# Patient Record
Sex: Male | Born: 1985
Health system: Southern US, Community
[De-identification: ages and names within clinical notes are randomized; demographics above are authoritative.]

## PROBLEM LIST (undated history)

## (undated) DIAGNOSIS — F419 Anxiety disorder, unspecified: Secondary | ICD-10-CM

## (undated) DIAGNOSIS — K7460 Unspecified cirrhosis of liver: Secondary | ICD-10-CM

## (undated) DIAGNOSIS — Z720 Tobacco use: Secondary | ICD-10-CM

## (undated) DIAGNOSIS — K219 Gastro-esophageal reflux disease without esophagitis: Secondary | ICD-10-CM

## (undated) DIAGNOSIS — G4700 Insomnia, unspecified: Secondary | ICD-10-CM

## (undated) DIAGNOSIS — I10 Essential (primary) hypertension: Secondary | ICD-10-CM

## (undated) DIAGNOSIS — F411 Generalized anxiety disorder: Secondary | ICD-10-CM

## (undated) DIAGNOSIS — N539 Unspecified male sexual dysfunction: Secondary | ICD-10-CM

## (undated) HISTORY — DX: Unspecified cirrhosis of liver: K74.60

## (undated) HISTORY — DX: Generalized anxiety disorder: F41.1

## (undated) HISTORY — DX: Unspecified male sexual dysfunction: N53.9

## (undated) HISTORY — DX: Insomnia, unspecified: G47.00

## (undated) HISTORY — DX: Gastro-esophageal reflux disease without esophagitis: K21.9

## (undated) HISTORY — DX: Tobacco use: Z72.0

## (undated) HISTORY — DX: Anxiety disorder, unspecified: F41.9

## (undated) HISTORY — PX: UPPER GASTROINTESTINAL ENDOSCOPY: SHX188

---
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
## 2005-08-21 ENCOUNTER — Emergency Department (HOSPITAL_COMMUNITY): Admission: EM | Admit: 2005-08-21 | Discharge: 2005-08-21 | Payer: Self-pay | Admitting: Emergency Medicine

## 2005-08-29 ENCOUNTER — Emergency Department (HOSPITAL_COMMUNITY): Admission: EM | Admit: 2005-08-29 | Discharge: 2005-08-29 | Payer: Self-pay | Admitting: Emergency Medicine

## 2007-04-03 ENCOUNTER — Emergency Department (HOSPITAL_COMMUNITY): Admission: EM | Admit: 2007-04-03 | Discharge: 2007-04-03 | Payer: Self-pay | Admitting: Emergency Medicine

## 2007-04-07 ENCOUNTER — Emergency Department (HOSPITAL_COMMUNITY): Admission: EM | Admit: 2007-04-07 | Discharge: 2007-04-07 | Payer: Self-pay | Admitting: Emergency Medicine

## 2011-01-15 LAB — CBC
HCT: 43
MCV: 84.7
Platelets: 213
RBC: 5.08

## 2011-01-15 LAB — DIFFERENTIAL
Basophils Absolute: 0
Basophils Relative: 0
Lymphocytes Relative: 4 — ABNORMAL LOW
Neutro Abs: 14.6 — ABNORMAL HIGH
Neutrophils Relative %: 90 — ABNORMAL HIGH

## 2011-01-15 LAB — COMPREHENSIVE METABOLIC PANEL
Albumin: 3.5
Alkaline Phosphatase: 100
BUN: 10
CO2: 26
Chloride: 105
Creatinine, Ser: 0.78
GFR calc non Af Amer: >60
Glucose, Bld: 83
Total Bilirubin: 1.5 — ABNORMAL HIGH

## 2011-01-15 LAB — MONONUCLEOSIS SCREEN: Mono Screen: NEGATIVE

## 2011-12-05 ENCOUNTER — Ambulatory Visit (INDEPENDENT_AMBULATORY_CARE_PROVIDER_SITE_OTHER): Payer: Managed Care, Other (non HMO) | Admitting: Physician Assistant

## 2011-12-05 VITALS — BP 137/83 | HR 81 | Temp 98.2°F | Resp 16 | Ht 72.0 in | Wt 208.0 lb

## 2011-12-05 DIAGNOSIS — J029 Acute pharyngitis, unspecified: Secondary | ICD-10-CM

## 2011-12-05 LAB — POCT RAPID STREP A (OFFICE): Rapid Strep A Screen: NEGATIVE

## 2011-12-05 MED ORDER — MAGIC MOUTHWASH W/LIDOCAINE: 10.0000 mL | ORAL | Status: DC | PRN

## 2011-12-05 NOTE — Patient Instructions (Signed)
Drink at least 64 ounces of water daily and get plenty of rest. Use ibuprofen &/or acetaminophen as needed for pain.

## 2011-12-05 NOTE — Progress Notes (Signed)
  Subjective:    Patient ID: Albert Hall, male    DOB: 1985/07/28, 26 y.o.   MRN: [id number]  HPI This 26 y.o. male presents for evaluation of a white patch on the side of the left tonsil.  Has happened x 3 previously.  Usually there are lots of white patches, throat gets very sore, feels almost closed up.  Today, throat pain is mild, worse with talking and eating, and only one "spot."  No fever, chills, n/v.  Some loose stools after eating at [employer] last night (typical for him). No nasal congestion, sinus pressure, drainage, ear pain/fullness, or cough.  Wife has similar symptoms.  Review of Systems As above.   History reviewed. No pertinent past medical history.  History reviewed. No pertinent past surgical history.  Prior to Admission medications   Not on File    Allergies  Allergen Reactions  . Penicillins Anaphylaxis    History   Social History  . Marital Status: Married    Spouse Name: Herbert Seta    Number of Children: 0  . Years of Education: 14   Occupational History  . [employer]    Social History Main Topics  . Smoking status: Current Everyday Smoker -- 0.5 packs/day for 3 years    Types: Cigarettes  . Smokeless tobacco: Former Neurosurgeon  . Alcohol Use: 0.0 - 0.5 oz/week    0-1 drink(s) per week  . Drug Use: No  . Sexually Active: Yes -- Male partner(s)   Other Topics Concern  . Not on file   Social History Narrative  . No narrative on file    Family History  Problem Relation Age of Onset  . Asthma Sister   . Asthma Sister        Objective:   Physical Exam  Constitutional: He is oriented to person, place, and time. Vital signs are normal. He appears well-developed and well-nourished. No distress.  HENT:  Head: Normocephalic and atraumatic.  Right Ear: Hearing normal.  Left Ear: Hearing normal.  Eyes: EOM are normal. Pupils are equal, round, and reactive to light.  Neck: Normal range of motion. Neck supple. No thyromegaly present.    Cardiovascular: Normal rate, regular rhythm and normal heart sounds.   Pulmonary/Chest: Effort normal and breath sounds normal.  Lymphadenopathy:       Head (right side): No tonsillar, no preauricular, no posterior auricular and no occipital adenopathy present.       Head (left side): No tonsillar, no preauricular, no posterior auricular and no occipital adenopathy present.    He has no cervical adenopathy.       Right: No supraclavicular adenopathy present.       Left: No supraclavicular adenopathy present.  Neurological: He is alert and oriented to person, place, and time. No sensory deficit.  Skin: Skin is warm, dry and intact. No rash noted. No cyanosis or erythema. Nails show no clubbing.  Psychiatric: He has a normal mood and affect.   Results for orders placed in visit on 12/05/11  POCT RAPID STREP A (OFFICE)      Component Value Range   Rapid Strep A Screen Negative  Negative      Assessment & Plan:   1. Acute pharyngitis  POCT rapid strep A, Culture, Group A Strep, Alum & Mag Hydroxide-Simeth (MAGIC MOUTHWASH W/LIDOCAINE) SOLN

## 2011-12-08 LAB — CULTURE, GROUP A STREP: Organism ID, Bacteria: NORMAL

## 2012-02-08 ENCOUNTER — Encounter (HOSPITAL_BASED_OUTPATIENT_CLINIC_OR_DEPARTMENT_OTHER): Payer: Self-pay | Admitting: *Deleted

## 2012-02-08 ENCOUNTER — Emergency Department (HOSPITAL_BASED_OUTPATIENT_CLINIC_OR_DEPARTMENT_OTHER)
Admission: EM | Admit: 2012-02-08 | Discharge: 2012-02-08 | Disposition: A | Discharge status: Home / Self Care | Payer: Worker's Compensation | Attending: Emergency Medicine | Admitting: Emergency Medicine

## 2012-02-08 DIAGNOSIS — F17200 Nicotine dependence, unspecified, uncomplicated: Secondary | ICD-10-CM | POA: Insufficient documentation

## 2012-02-08 DIAGNOSIS — Z79899 Other long term (current) drug therapy: Secondary | ICD-10-CM | POA: Insufficient documentation

## 2012-02-08 DIAGNOSIS — W2209XA Striking against other stationary object, initial encounter: Secondary | ICD-10-CM | POA: Insufficient documentation

## 2012-02-08 DIAGNOSIS — S61210A Laceration without foreign body of right index finger without damage to nail, initial encounter: Secondary | ICD-10-CM

## 2012-02-08 DIAGNOSIS — S61209A Unspecified open wound of unspecified finger without damage to nail, initial encounter: Secondary | ICD-10-CM | POA: Insufficient documentation

## 2012-02-08 DIAGNOSIS — Y9229 Other specified public building as the place of occurrence of the external cause: Secondary | ICD-10-CM | POA: Insufficient documentation

## 2012-02-08 DIAGNOSIS — Y990 Civilian activity done for income or pay: Secondary | ICD-10-CM | POA: Insufficient documentation

## 2012-02-08 MED ORDER — HYDROCODONE-ACETAMINOPHEN 5-325 MG PO TABS: 2.0000 | ORAL_TABLET | ORAL | Status: DC | PRN

## 2012-02-08 MED ORDER — SULFAMETHOXAZOLE-TRIMETHOPRIM 800-160 MG PO TABS: 1.0000 | ORAL_TABLET | Freq: Two times a day (BID) | ORAL | Status: DC

## 2012-02-08 MED ORDER — LIDOCAINE HCL 2 % IJ SOLN
INTRAMUSCULAR | Status: AC
  Administered 2012-02-08: 21:00:00
  Filled 2012-02-08: qty 20

## 2012-02-08 NOTE — ED Provider Notes (Signed)
History     CSN: [id number]  Arrival date & time 02/08/12  1815   First MD Initiated Contact with Patient 02/08/12 1906      Chief Complaint  Patient presents with  . Finger Injury    (Consider location/radiation/quality/duration/timing/severity/associated sxs/prior treatment) Patient is a 26 y.o. male presenting with hand pain. The history is provided by the patient. No language interpreter was used.  Hand Pain This is a new problem. The current episode started today. The problem occurs constantly. The symptoms are aggravated by bending. Treatments tried: bandage.  Pt reports he slipped and fell and cut finger over a piece of steele.  Pt complains of laceration only.  No other complaint  History reviewed. No pertinent past medical history.  History reviewed. No pertinent past surgical history.  Family History  Problem Relation Age of Onset  . Asthma Sister   . Asthma Sister     History  Substance Use Topics  . Smoking status: Current Every Day Smoker -- 0.5 packs/day for 3 years    Types: Cigarettes  . Smokeless tobacco: Former Neurosurgeon  . Alcohol Use: 0.0 - 0.5 oz/week    0-1 drink(s) per week      Review of Systems  Skin: Positive for wound.  All other systems reviewed and are negative.    Allergies  Penicillins  Home Medications   Current Outpatient Rx  Name Route Sig Dispense Refill  . MAGIC MOUTHWASH W/LIDOCAINE Oral Take 10 mLs by mouth every 2 (two) hours as needed. 360 mL 0    BP 145/89  Pulse 89  Temp 98.3 F (36.8 C) (Oral)  Resp 18  Ht 6' (1.829 m)  Wt 197 lb (89.359 kg)  BMI 26.72 kg/m2  SpO2 100%  Physical Exam  Nursing note and vitals reviewed. Constitutional: He appears well-developed and well-nourished.  HENT:  Head: Normocephalic.  Musculoskeletal: Normal range of motion.       2cm flap laceration right index finger.   Neurological: He is alert.  Skin: Skin is warm.    ED Course  LACERATION REPAIR Date/Time: 02/08/2012  8:50 PM Performed by: Elson Areas Authorized by: Elson Areas Consent: Verbal consent obtained. Risks and benefits: risks, benefits and alternatives were discussed Consent given by: patient Required items: required blood products, implants, devices, and special equipment available Patient identity confirmed: verbally with patient Time out: Immediately prior to procedure a "time out" was called to verify the correct patient, procedure, equipment, support staff and site/side marked as required. Body area: upper extremity Location details: right index finger Laceration length: 2 cm Tendon involvement: none Vascular damage: no Anesthesia: local infiltration Local anesthetic: lidocaine 2% without epinephrine Patient sedated: no Preparation: Patient was prepped and draped in the usual sterile fashion. Amount of cleaning: standard Debridement: moderate Skin closure: 5-0 Prolene Number of sutures: 6 Technique: simple Approximation: loose Approximation difficulty: simple Patient tolerance: Patient tolerated the procedure well with no immediate complications.   (including critical care time)  Labs Reviewed - No data to display No results found.   No diagnosis found.    MDM          Elson Areas, PA 02/08/12 2052

## 2012-02-08 NOTE — ED Notes (Signed)
MD at bedside. 

## 2012-02-08 NOTE — ED Notes (Signed)
Patient states he [place] the [place] and slipped, hitting hie right hand on a piece of rolled steel.  Laceration to right index finger.

## 2012-02-10 NOTE — ED Provider Notes (Signed)
History/physical exam/procedure(s) were performed by non-physician practitioner and as supervising physician I was immediately available for consultation/collaboration. I have reviewed all notes and am in agreement with care and plan.   Joshuan Bolander S Olivier Frayre, MD 02/10/12 2358 

## 2013-07-23 ENCOUNTER — Emergency Department (HOSPITAL_BASED_OUTPATIENT_CLINIC_OR_DEPARTMENT_OTHER)
Admission: EM | Admit: 2013-07-23 | Discharge: 2013-07-23 | Disposition: A | Discharge status: Home / Self Care | Payer: 59 | Attending: Emergency Medicine | Admitting: Emergency Medicine

## 2013-07-23 ENCOUNTER — Encounter (HOSPITAL_BASED_OUTPATIENT_CLINIC_OR_DEPARTMENT_OTHER): Payer: Self-pay | Admitting: Emergency Medicine

## 2013-07-23 DIAGNOSIS — Y929 Unspecified place or not applicable: Secondary | ICD-10-CM | POA: Insufficient documentation

## 2013-07-23 DIAGNOSIS — T23229A Burn of second degree of unspecified single finger (nail) except thumb, initial encounter: Secondary | ICD-10-CM | POA: Insufficient documentation

## 2013-07-23 DIAGNOSIS — X088XXA Exposure to other specified smoke, fire and flames, initial encounter: Secondary | ICD-10-CM | POA: Insufficient documentation

## 2013-07-23 DIAGNOSIS — F17200 Nicotine dependence, unspecified, uncomplicated: Secondary | ICD-10-CM | POA: Insufficient documentation

## 2013-07-23 DIAGNOSIS — Y9389 Activity, other specified: Secondary | ICD-10-CM | POA: Insufficient documentation

## 2013-07-23 DIAGNOSIS — T300 Burn of unspecified body region, unspecified degree: Secondary | ICD-10-CM

## 2013-07-23 DIAGNOSIS — Z880 Allergy status to penicillin: Secondary | ICD-10-CM | POA: Insufficient documentation

## 2013-07-23 MED ORDER — SILVER SULFADIAZINE 1 % EX CREA
TOPICAL_CREAM | Freq: Once | CUTANEOUS | Status: AC
  Administered 2013-07-23: 23:00:00 via TOPICAL
  Filled 2013-07-23: qty 85

## 2013-07-23 NOTE — ED Provider Notes (Signed)
Medical screening examination/treatment/procedure(s) were performed by non-physician practitioner and as supervising physician I was immediately available for consultation/collaboration.   EKG Interpretation None        Rolan BuccoMelanie Treven Holtman, MD 07/23/13 2348

## 2013-07-23 NOTE — Discharge Instructions (Signed)
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
## 2013-07-23 NOTE — ED Notes (Signed)
PA at bedside.

## 2014-01-08 ENCOUNTER — Encounter: Payer: Self-pay | Admitting: Family Medicine

## 2014-01-08 ENCOUNTER — Ambulatory Visit (INDEPENDENT_AMBULATORY_CARE_PROVIDER_SITE_OTHER): Payer: 59 | Admitting: Family Medicine

## 2014-01-08 VITALS — BP 120/84 | Temp 98.3°F | Ht 72.5 in | Wt 230.0 lb

## 2014-01-08 DIAGNOSIS — F411 Generalized anxiety disorder: Secondary | ICD-10-CM

## 2014-01-08 DIAGNOSIS — Z720 Tobacco use: Secondary | ICD-10-CM

## 2014-01-08 DIAGNOSIS — F17200 Nicotine dependence, unspecified, uncomplicated: Secondary | ICD-10-CM

## 2014-01-08 DIAGNOSIS — G4700 Insomnia, unspecified: Secondary | ICD-10-CM | POA: Insufficient documentation

## 2014-01-08 HISTORY — DX: Generalized anxiety disorder: F41.1

## 2014-01-08 HISTORY — DX: Insomnia, unspecified: G47.00

## 2014-01-08 MED ORDER — LORAZEPAM 0.5 MG PO TABS: ORAL_TABLET | ORAL | Status: DC

## 2014-01-08 MED ORDER — VARENICLINE TARTRATE 1 MG PO TABS: ORAL_TABLET | ORAL | Status: DC

## 2014-01-08 NOTE — Patient Instructions (Signed)
Chantix 1 mg............ one half tab daily in the morning  Taper by 2 per week  Followup in one month  Ativan 0.5.........Marland Kitchen. 1 at bedtime for sleep  Call and make an appointment to see Judithe ModestSusan Bond for evaluation of your anxiety disorder
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
## 2014-01-08 NOTE — Progress Notes (Signed)
Pre visit review using our clinic review tool, if applicable. No additional management support is needed unless otherwise documented below in the visit note. 

## 2014-01-09 ENCOUNTER — Telehealth: Payer: Self-pay | Admitting: Family Medicine

## 2014-01-09 NOTE — Telephone Encounter (Signed)
emmi mailed  °

## 2014-01-09 NOTE — Telephone Encounter (Signed)
PA for Chantix was denied.  Pt must have a history of failure, contraindication or intolerance to bupropion.

## 2014-01-10 NOTE — Telephone Encounter (Signed)
Noted.  Patient will have to pay out of pocket if he wants to continue.

## 2014-01-25 ENCOUNTER — Ambulatory Visit (INDEPENDENT_AMBULATORY_CARE_PROVIDER_SITE_OTHER): Payer: 59 | Admitting: Licensed Clinical Social Worker

## 2014-01-25 DIAGNOSIS — F4322 Adjustment disorder with anxiety: Secondary | ICD-10-CM

## 2014-02-01 ENCOUNTER — Ambulatory Visit (INDEPENDENT_AMBULATORY_CARE_PROVIDER_SITE_OTHER): Payer: 59 | Admitting: Licensed Clinical Social Worker

## 2014-02-01 DIAGNOSIS — F4322 Adjustment disorder with anxiety: Secondary | ICD-10-CM

## 2014-02-11 ENCOUNTER — Encounter: Payer: Self-pay | Admitting: Family Medicine

## 2014-02-11 ENCOUNTER — Ambulatory Visit (INDEPENDENT_AMBULATORY_CARE_PROVIDER_SITE_OTHER): Payer: 59 | Admitting: Family Medicine

## 2014-02-11 VITALS — BP 120/84 | Temp 98.2°F | Wt 230.0 lb

## 2014-02-11 DIAGNOSIS — Z720 Tobacco use: Secondary | ICD-10-CM

## 2014-02-11 HISTORY — DX: Tobacco use: Z72.0

## 2014-02-11 MED ORDER — LORAZEPAM 1 MG PO TABS: 1.0000 mg | ORAL_TABLET | Freq: Two times a day (BID) | ORAL | Status: DC | PRN

## 2014-02-11 MED ORDER — TRAZODONE HCL 50 MG PO TABS: ORAL_TABLET | ORAL | Status: DC

## 2014-02-11 MED ORDER — OMEPRAZOLE 20 MG PO CPDR: 20.0000 mg | DELAYED_RELEASE_CAPSULE | Freq: Every day | ORAL | Status: DC

## 2014-02-11 NOTE — Progress Notes (Signed)
Pre visit review using our clinic review tool, if applicable. No additional management support is needed unless otherwise documented below in the visit note. 

## 2014-02-11 NOTE — Patient Instructions (Signed)
Prilosec 20 mg daily in the morning.  Chantix 0.5 mg daily in the morning............. Begin a tapering program........Marland Kitchen. Decrease by 3 per week  Follow-up in 4 weeks  Ativan 1 mg.......Marland Kitchen. 1 at bedtime  Trazodone 50 mg.........Marland Kitchen. 1 at bedtime

## 2014-02-11 NOTE — Progress Notes (Signed)
   Subjective:    Patient ID: Jeannetta EllisMichael W Cafaro, male    DOB: 07/31/1985, 28 y.o.   MRN: [id number]  HPIMichael is a 28 year old married male smoker three quarters of pack a day who comes in today for evaluation of 3 issues  He works second shift and has developed sleep dysfunction.  He says for example her get off the 10:00 at night come home [place] we then go to bed to 3:00 in the morning. He tried the 0.5 of Ativan but it didn't help  He takes Prilosec 20 mg for chronic reflux that is helping he wishes to continue that  We gave him prescription for the Chantix he didn't get it filled because it was too expensive. He continues to smoke three quarters a pack of cigarettes per day.    Review of Systems    review of systems otherwise negative Objective:   Physical Exam  Well-developed well-nourished male no acute distress vital signs stable he is afebrile      Assessment & Plan:  Sleep dysfunction........... Continue low-dose Ativan.......Marland Kitchen. Add trazodone........ Follow-up in 4 weeks  Tobacco abuse............ Chantix........ 0.5 mg daily....... Outlined tapering program  Reflux esophagitis............ Continue Prilosec 20 mg daily

## 2014-03-06 ENCOUNTER — Telehealth: Payer: Self-pay | Admitting: Family Medicine

## 2014-03-06 DIAGNOSIS — Z720 Tobacco use: Secondary | ICD-10-CM

## 2014-03-06 MED ORDER — OMEPRAZOLE 20 MG PO CPDR: 20.0000 mg | DELAYED_RELEASE_CAPSULE | Freq: Two times a day (BID) | ORAL | Status: DC

## 2014-03-06 NOTE — Telephone Encounter (Signed)
Wife called and said the following rx was written wrong omeprazole (PRILOSEC) 20 MG capsule  Wife said the rx should be for 60 pills instead of 30 because he takes 2 a day .   The previous rx was for 30 pills   [facility]

## 2014-03-06 NOTE — Telephone Encounter (Signed)
New Rx sent.

## 2014-03-11 ENCOUNTER — Encounter: Payer: Self-pay | Admitting: Family Medicine

## 2014-03-11 ENCOUNTER — Ambulatory Visit (INDEPENDENT_AMBULATORY_CARE_PROVIDER_SITE_OTHER): Payer: 59 | Admitting: Family Medicine

## 2014-03-11 VITALS — BP 130/90 | Temp 97.9°F | Wt 232.0 lb

## 2014-03-11 DIAGNOSIS — Z720 Tobacco use: Secondary | ICD-10-CM

## 2014-03-11 DIAGNOSIS — G4700 Insomnia, unspecified: Secondary | ICD-10-CM

## 2014-03-11 NOTE — Progress Notes (Signed)
Pre visit review using our clinic review tool, if applicable. No additional management support is needed unless otherwise documented below in the visit note. 

## 2014-03-11 NOTE — Patient Instructions (Signed)
Continue to taper the cigarettes......... by 1 per week........ at the Chantix 0.5 mg daily in the morning when necessary  Continue the trazodone 50 mg at bedtime  Stop the Ativan  Return when necessary

## 2014-03-11 NOTE — Progress Notes (Signed)
   Subjective:    Patient ID: Albert Hall, male    DOB: 05/28/1985, 28 y.o.   MRN: [id number]  HPI Albert Hall is a 28 year old married male smoker....... down to 5 cigarettes a day...Marland Kitchen.Marland Kitchen.. did not take the Chantix yet...Marland Kitchen.Marland Kitchen.Marland Kitchen. who comes in today for follow-up of tobacco abuse and insomnia  He decided try to taper not take the Chantix. Encouraged him to continue whatever process he wants to use to stop smoking completely  He's taken the trazodone 50 mg at bedtime and asleep is pretty much back to normal. He also takes a full milligram or half a milligram of the Ativan.   Review of Systems    review of systems otherwise negative Objective:   Physical Exam  Well-developed well-nourished male no acute distress vital signs stable he is afebrile      Assessment & Plan:  Tobacco abuse........ tapered to 5 cigarettes per day........ continue to taper..... Chantix when necessary  Insomnia.......... continue trazodone 50 mg at bedtime... Stop the Ativan

## 2014-05-13 ENCOUNTER — Encounter: Payer: Self-pay | Admitting: Family Medicine

## 2014-05-13 ENCOUNTER — Ambulatory Visit (INDEPENDENT_AMBULATORY_CARE_PROVIDER_SITE_OTHER): Payer: 59 | Admitting: Family Medicine

## 2014-05-13 VITALS — BP 142/92 | HR 93 | Temp 98.1°F | Ht 72.5 in | Wt 222.6 lb

## 2014-05-13 DIAGNOSIS — F411 Generalized anxiety disorder: Secondary | ICD-10-CM

## 2014-05-13 DIAGNOSIS — G4700 Insomnia, unspecified: Secondary | ICD-10-CM

## 2014-05-13 DIAGNOSIS — Z720 Tobacco use: Secondary | ICD-10-CM

## 2014-05-13 MED ORDER — LORAZEPAM 1 MG PO TABS: 1.0000 mg | ORAL_TABLET | Freq: Two times a day (BID) | ORAL | Status: DC | PRN

## 2014-05-13 NOTE — Progress Notes (Signed)
   Subjective:    Patient ID: Albert Hall, male    DOB: 05/04/1985, 29 y.o.   MRN: [id number]  HPI  Albert Hall is a 29 year old male who comes in today accompanied by his wife for evaluation of multiple issues  We tried trazodone 50 mg at bedtime for sleep dysfunction however he had side effects and stopped it 2 weeks ago. He can sleep on the Ativan 1 mg daily but I'm not sure this is a good long-term solution.  He has a lot of GI upset because he continues to smoke. He takes Prilosec 20 mg twice a day  I gave her a prescription for the Chantix he never got it filled. He continues to smoke 5-10 cigarettes a day.  We discussed all the options she is willing to go see Dr. Nolen MuMcKinney in group for further evaluation     Review of Systems Review of systems otherwise negative    Objective:   Physical Exam  Well-developed well-nourished male no acute distress vital signs stable he's afebrile      Assessment & Plan:  Tobacco abuse,,,,,,, start the Chantix program  Insomnia,,,,,, continue Ativan 1 mg daily at bedtime,,,,,,,, consult with Dr. Loralie ChampagneMcKinney's group  Reflux esophagitis secondary to nicotine use,,,,,,,, continue Prilosec until you stop smoking

## 2014-05-13 NOTE — Patient Instructions (Signed)
Continue the Ativan 1 mg at bedtime  Call Dr. Ann MakiParrish McKinney's office phone number [phone] and asked to see one of the new doctors  Chantix............ one half tab daily in the morning......... and begin a tapering program as follows......... a today for a week.......... 6 today for a week.......Marland Kitchen. etc.  Continue Chantix one half tab daily for 2 months after you stop smoking  1 she stop smoking you will no longer need the Prilosec

## 2014-05-13 NOTE — Progress Notes (Signed)
Pre visit review using our clinic review tool, if applicable. No additional management support is needed unless otherwise documented below in the visit note. 

## 2014-08-06 ENCOUNTER — Other Ambulatory Visit: Payer: Self-pay | Admitting: Family Medicine

## 2015-02-18 ENCOUNTER — Other Ambulatory Visit: Payer: Self-pay | Admitting: Family Medicine

## 2015-04-10 ENCOUNTER — Other Ambulatory Visit: Payer: Self-pay | Admitting: Family Medicine

## 2015-04-15 ENCOUNTER — Other Ambulatory Visit: Payer: Self-pay | Admitting: Family Medicine

## 2015-06-26 ENCOUNTER — Other Ambulatory Visit: Payer: Self-pay | Admitting: Family Medicine

## 2015-09-02 ENCOUNTER — Other Ambulatory Visit: Payer: Self-pay | Admitting: Family Medicine

## 2015-09-16 ENCOUNTER — Ambulatory Visit (INDEPENDENT_AMBULATORY_CARE_PROVIDER_SITE_OTHER): Payer: 59 | Admitting: Family Medicine

## 2015-09-16 ENCOUNTER — Ambulatory Visit: Payer: Self-pay | Admitting: Family Medicine

## 2015-09-16 ENCOUNTER — Encounter: Payer: Self-pay | Admitting: Family Medicine

## 2015-09-16 VITALS — BP 122/90 | HR 116 | Temp 97.7°F | Ht 71.0 in | Wt 241.2 lb

## 2015-09-16 DIAGNOSIS — K219 Gastro-esophageal reflux disease without esophagitis: Secondary | ICD-10-CM

## 2015-09-16 DIAGNOSIS — G4700 Insomnia, unspecified: Secondary | ICD-10-CM

## 2015-09-16 DIAGNOSIS — R112 Nausea with vomiting, unspecified: Secondary | ICD-10-CM

## 2015-09-16 DIAGNOSIS — Z720 Tobacco use: Secondary | ICD-10-CM | POA: Diagnosis not present

## 2015-09-16 DIAGNOSIS — F411 Generalized anxiety disorder: Secondary | ICD-10-CM

## 2015-09-16 DIAGNOSIS — Z6379 Other stressful life events affecting family and household: Secondary | ICD-10-CM | POA: Diagnosis not present

## 2015-09-16 DIAGNOSIS — R197 Diarrhea, unspecified: Secondary | ICD-10-CM

## 2015-09-16 MED ORDER — PANTOPRAZOLE SODIUM 40 MG PO TBEC: 40.0000 mg | DELAYED_RELEASE_TABLET | Freq: Every day | ORAL | Status: DC

## 2015-09-16 MED ORDER — CITALOPRAM HYDROBROMIDE 10 MG PO TABS: 10.0000 mg | ORAL_TABLET | Freq: Every day | ORAL | Status: DC

## 2015-09-16 MED ORDER — LORAZEPAM 1 MG PO TABS: 1.0000 mg | ORAL_TABLET | Freq: Two times a day (BID) | ORAL | Status: DC | PRN

## 2015-09-16 NOTE — Assessment & Plan Note (Signed)
Continue to encourage cessation. Not a good time to quit due to increased stressors.

## 2015-09-16 NOTE — Progress Notes (Signed)
BP 122/90 mmHg  Pulse 116  Temp(Src) 97.7 F (36.5 C)  Ht  (1.803 m)  Wt 241 lb 4 oz (109.43 kg)  BMI 33.66 kg/m2  SpO2 96%   CC: new pt to establish  Subjective:    Patient ID: Albert Hall, male    DOB: 06-12-1985, 30 y.o.   MRN: [id number]  HPI: Albert Hall is a 30 y.o. male presenting on 09/16/2015 for Establish Care; Emesis; and Insomnia   Presents with wife and 3wk old daughter - she has moderate VSD. Seeing Dr Meredeth Ide at [facility].   Transfer from Dr Tawanna Cooler today. Last seen 05/2014 with chronic insomnia and anxiety - referred to psych Dr Ann Maki McKinney's group to establish care. Intolerant to trazodone, was prior on ativan  for sleep and anxiety - has been receiving #60 tablets per month regularly by prior PCP. Ran out 09/05/2014. Was taking 1.5 nightly for sleep and extra tablet for social stressors. Endorses h/o panic attacks and social anxiety. He did have 3 sessions with Dr Nolen Mu - "didn't go well," felt frustrated. No SI/HI or significant depression with new baby, just tired.   Chronic insomnia - valerian didn't help. Benadryl causes oversedation. Melatonin has not been tried.   GERD - controlled on omeprazole  BID.   Continued smoker 1/4 ppd.   1d h/o GI illness - nausea/vomiting, diarrhea x2 today loose dark stool, persistent throughout the night. No sick contacts at work. No new foods, no revent travel. No fevers/chills, constipation, abd pain or cramping. No blood in stool.   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 Activity: no regular exercise Diet: good water, fruits/vegetables daily  Relevant past medical, surgical, family and social history reviewed and updated as indicated. Interim medical history since our last visit reviewed. Allergies and medications reviewed and updated. Current  Outpatient Prescriptions on File Prior to Visit  Medication Sig  . varenicline (CHANTIX CONTINUING MONTH PAK) 1 MG tablet One half tab every morning (Patient not taking: Reported on 09/16/2015)   No current facility-administered medications on file prior to visit.    Review of Systems Per HPI unless specifically indicated in ROS section     Objective:    BP 122/90 mmHg  Pulse 116  Temp(Src) 97.7 F (36.5 C)  Ht  (1.803 m)  Wt 241 lb 4 oz (109.43 kg)  BMI 33.66 kg/m2  SpO2 96%  Wt Readings from Last 3 Encounters:  09/16/15 241 lb 4 oz (109.43 kg)  05/13/14 222 lb 9.6 oz (100.971 kg)  03/11/14 232 lb (105.235 kg)    Physical Exam  Constitutional: He appears well-developed and well-nourished. No distress.  HENT:  Mouth/Throat: Oropharynx is clear and moist. No oropharyngeal exudate.  Eyes: Conjunctivae and EOM are normal. Pupils are equal, round, and reactive to light. No scleral icterus.  Cardiovascular: Regular rhythm, normal heart sounds and intact distal pulses.  Tachycardia present.   No murmur heard. Pulmonary/Chest: Effort normal and breath sounds normal. No respiratory distress. He has no wheezes. He has no rales.  Musculoskeletal: He exhibits no edema.  Skin: Skin is warm and dry. No rash noted.  Psychiatric: He has a normal mood and affect. His behavior is normal. Judgment and thought content normal.  Nursing note and vitals reviewed.     Assessment & Plan:   Problem  List Items Addressed This Visit    Insomnia (Chronic)    Chronic, has tried trazodone, valerian root, benadryl. Long-term treatment has been nightly benzo with some tolerance noted (0.5mg  --> 1.5mg  latest). He also has been drinking 1 beer nightly.  Discussed concerns with this chronic regimen including tolerance/dependence and addiction potential of medication. Pt agrees to taper down on nightly ativan for sleep (1mg  x 1 wk then 0.5mg  x 1 wk) and trial melatonin 3mg  for sleep.  Provided with sleep  hygiene checklist - see pt instructions.       Generalized anxiety disorder (Chronic)    Long-term - saw psychiatrist last year x3.  Currently on benzo as only treatment regimen. He did run out of benzo 1+ wk ago - concern for benzo withdrawal with tachycardia, malaise, nausea/vomiting. Will refill lorazepam 1mg  #60 in hopes of tapering off over next 1-2 months. May use PRN benzo for anxiety after initial 2 wk taper Discussed better long term management of GAD - pt agrees to trial celexa 10mg  daily. Discussed common side effects.  RTC 1 mo f/u visit.      Tobacco abuse (Chronic)    Continue to encourage cessation. Not a good time to quit due to increased stressors.      GERD (gastroesophageal reflux disease) - Primary    Well controlled on omeprazole 20mg  BID. Will switch to protonix 40mg  daily given celexa interaction.       Relevant Medications   pantoprazole (PROTONIX) 40 MG tablet   Stressful life event affecting family    Increased work stress, increased family stress - 3wk daughter with VSD [facility] Dr Meredeth IdeFleming. Support provided.      Nausea vomiting and diarrhea    1-2d h/o GI symptoms - possible gastroenteritis vs benzo withdrawal. Restart benzo with goal to taper off, discussed isolation in case viral illness given 3wk old daughter at home. See above.           Follow up plan: Return in about 4 weeks (around 10/14/2015), or as needed, for follow up visit.  Albert BoydenJavier Suhana Wilner, MD

## 2015-09-16 NOTE — Patient Instructions (Addendum)
Lorazepam refilled today - #60 tablets. Try to taper off use for sleep - take 1 tablet nightly for 1 week then decrease to 1/2 tablet nightly for 1 week then stop for sleep. Keep lorazepam pills available to use as needed for anxiety.  Start celexa 10mg  daily for anxiety. This medicine will take 4-6 weeks to take full effect. Start melatonin 3mg  nightly for sleep. Work on healthy stress relieving strategies.  Return in 1 month for follow up.   Sleep hygiene checklist: 1. Avoid naps during the day 2. Avoid stimulants such as caffeine and nicotine. Avoid bedtime alcohol (it can speed onset of sleep but the body's metabolism can cause awakenings). 3. All forms of exercise help ensure sound sleep - limit vigorous exercise to morning or late afternoon 4. Avoid food too close to bedtime including chocolate (which contains caffeine) 5. Soak up natural light 6. Establish regular bedtime routine. 7. Associate bed with sleep - avoid TV, computer or phone, reading while in bed. 8. Ensure pleasant, relaxing sleep environment - quiet, dark, cool room.

## 2015-09-16 NOTE — Assessment & Plan Note (Signed)
Long-term - saw psychiatrist last year x3.  Currently on benzo as only treatment regimen. He did run out of benzo 1+ wk ago - concern for benzo withdrawal with tachycardia, malaise, nausea/vomiting. Will refill lorazepam 1mg  #60 in hopes of tapering off over next 1-2 months. May use PRN benzo for anxiety after initial 2 wk taper Discussed better long term management of GAD - pt agrees to trial celexa 10mg  daily. Discussed common side effects.  RTC 1 mo f/u visit.

## 2015-09-16 NOTE — Assessment & Plan Note (Addendum)
Chronic, has tried trazodone, valerian root, benadryl. Long-term treatment has been nightly benzo with some tolerance noted (0.5mg  --> 1.5mg  latest). He also has been drinking 1 beer nightly.  Discussed concerns with this chronic regimen including tolerance/dependence and addiction potential of medication. Pt agrees to taper down on nightly ativan for sleep (1mg  x 1 wk then 0.5mg  x 1 wk) and trial melatonin 3mg  for sleep.  Provided with sleep hygiene checklist - see pt instructions.

## 2015-09-16 NOTE — Assessment & Plan Note (Signed)
1-2d h/o GI symptoms - possible gastroenteritis vs benzo withdrawal. Restart benzo with goal to taper off, discussed isolation in case viral illness given 3wk old daughter at home. See above.

## 2015-09-16 NOTE — Progress Notes (Signed)
Pre visit review using our clinic review tool, if applicable. No additional management support is needed unless otherwise documented below in the visit note. 

## 2015-09-16 NOTE — Assessment & Plan Note (Signed)
Well controlled on omeprazole 20mg  BID. Will switch to protonix 40mg  daily given celexa interaction.

## 2015-09-16 NOTE — Assessment & Plan Note (Addendum)
Increased work stress, increased family stress - 3wk daughter with VSD followed by [facility] Dr Meredeth IdeFleming. Support provided.

## 2015-09-17 ENCOUNTER — Telehealth: Payer: Self-pay

## 2015-09-17 NOTE — Telephone Encounter (Signed)
Pt was seen on 09/16/15 and pt said melatonin was not called in to pharmacy; advised pt per AVS to take melatonin 3 mg at hs for sleep. This is an OTC med. Pt voiced understanding.

## 2015-10-17 ENCOUNTER — Encounter: Payer: Self-pay | Admitting: Family Medicine

## 2015-10-17 ENCOUNTER — Ambulatory Visit (INDEPENDENT_AMBULATORY_CARE_PROVIDER_SITE_OTHER): Payer: 59 | Admitting: Family Medicine

## 2015-10-17 VITALS — BP 134/86 | HR 76 | Temp 98.1°F | Wt 237.0 lb

## 2015-10-17 DIAGNOSIS — G4700 Insomnia, unspecified: Secondary | ICD-10-CM | POA: Diagnosis not present

## 2015-10-17 DIAGNOSIS — F411 Generalized anxiety disorder: Secondary | ICD-10-CM

## 2015-10-17 DIAGNOSIS — K219 Gastro-esophageal reflux disease without esophagitis: Secondary | ICD-10-CM

## 2015-10-17 MED ORDER — SERTRALINE HCL 50 MG PO TABS: 50.0000 mg | ORAL_TABLET | Freq: Every day | ORAL | Status: DC

## 2015-10-17 MED ORDER — OMEPRAZOLE 20 MG PO CPDR: 20.0000 mg | DELAYED_RELEASE_CAPSULE | Freq: Two times a day (BID) | ORAL | Status: DC

## 2015-10-17 NOTE — Assessment & Plan Note (Signed)
Doing well on SSRI - will change to sertraline 50mg  daily due to PPI interaction - see below. RTC 4-6 wks f/u visit

## 2015-10-17 NOTE — Assessment & Plan Note (Signed)
Breakthrough sxs on protonix 40mg  daily  Will return to omeprazole 20mg  bid. Will change SSRI from celexa to zoloft. Update with effect.

## 2015-10-17 NOTE — Progress Notes (Signed)
Pre visit review using our clinic review tool, if applicable. No additional management support is needed unless otherwise documented below in the visit note. 

## 2015-10-17 NOTE — Patient Instructions (Addendum)
You are doing well today.  Stop celexa, start sertraline 50mg  daily for anxiety.  Restart omeprazole 20mg  twice daily instead of protonix.  Return in 4-6 weeks for follow up visit.  [facility].

## 2015-10-17 NOTE — Assessment & Plan Note (Signed)
Melatonin has been helpful, but noticing more trouble with sleep likely due to worsening GERD sxs at night.

## 2015-10-17 NOTE — Progress Notes (Signed)
   BP 134/86 mmHg  Pulse 76  Temp(Src) 98.1 F (36.7 C) (Oral)  Wt 237 lb (107.502 kg)  SpO2 98%   CC: f/u visit  Subjective:    Patient ID: Albert Hall, male    DOB: 06/06/1985, 30 y.o.   MRN: [id number]  HPI: Albert Hall is a 30 y.o. male presenting on 10/17/2015 for Anxiety; Gastroesophageal Reflux; and Diarrhea   See prior note for details. Established care last month. Newborn with VSD is doing well - [facility].   H/o panic attacks, social anxiety, chronic insomnia. Valerian, benadryl, trazodone not effective. Melatonin may be helping sleep some - but persistent trouble sleeping possibly due to worsening GERD again. Last visit for anxiety we refilled lorazepam #60 and discussed taper over 1-2 months. We also started celexa 10mg  daily. Some trouble with 4th of July fireworks. He has decreased ativan to PRN anxiety. Still notices some social anxiety.   GERD - on omeprazole 20mg  BID. Last visit we changed to 40mg  protonix once daily due to celexa interaction - not as effective however. Ongoing stomach upset with diarrhea in am. No vomiting, fevers, blood in stool.   Relevant past medical, surgical, family and social history reviewed and updated as indicated. Interim medical history since our last visit reviewed. Allergies and medications reviewed and updated. Current Outpatient Prescriptions on File Prior to Visit  Medication Sig  . LORazepam (ATIVAN) 1 MG tablet Take 1 tablet (1 mg total) by mouth 2 (two) times daily as needed.  . varenicline (CHANTIX CONTINUING MONTH PAK) 1 MG tablet One half tab every morning (Patient not taking: Reported on 10/17/2015)   No current facility-administered medications on file prior to visit.    Review of Systems Per HPI unless specifically indicated in ROS section     Objective:    BP 134/86 mmHg  Pulse 76  Temp(Src) 98.1 F (36.7 C) (Oral)  Wt 237 lb (107.502 kg)  SpO2 98%  Wt Readings from Last 3 Encounters:    10/17/15 237 lb (107.502 kg)  09/16/15 241 lb 4 oz (109.43 kg)  05/13/14 222 lb 9.6 oz (100.971 kg)    Physical Exam  Constitutional: He appears well-developed and well-nourished. No distress.  Psychiatric: He has a normal mood and affect. His behavior is normal. Thought content normal.  Nursing note and vitals reviewed.     Assessment & Plan:  Pt planning on establishing care [facility] - much closer to home.  Problem List Items Addressed This Visit    Insomnia (Chronic)    Melatonin has been helpful, but noticing more trouble with sleep likely due to worsening GERD sxs at night.      Generalized anxiety disorder - Primary (Chronic)    Doing well on SSRI - will change to sertraline 50mg  daily due to PPI interaction - see below. RTC 4-6 wks f/u visit      GERD (gastroesophageal reflux disease)    Breakthrough sxs on protonix 40mg  daily  Will return to omeprazole 20mg  bid. Will change SSRI from celexa to zoloft. Update with effect.      Relevant Medications   omeprazole (PRILOSEC) 20 MG capsule       Follow up plan: Return in about 4 weeks (around 11/14/2015) for follow up visit.  Eustaquio BoydenJavier Haydan Wedig, MD

## 2015-10-27 ENCOUNTER — Telehealth: Payer: Self-pay

## 2015-10-27 DIAGNOSIS — F411 Generalized anxiety disorder: Secondary | ICD-10-CM

## 2015-10-27 MED ORDER — ESCITALOPRAM OXALATE 10 MG PO TABS: 10.0000 mg | ORAL_TABLET | Freq: Every day | ORAL | Status: DC

## 2015-10-27 NOTE — Telephone Encounter (Signed)
Albert Hall [facility](DPR signed) left v/m; pt started taking Zoloft 2 weeks ago and having side effects;pt has not been sleeping well, thinks zoloft increasing insomnia and pt having problems with climax and wants to know if can just stop zoloft or does pt have to taper off the med. Albert Hall request cb on 10/28/15.

## 2015-10-27 NOTE — Telephone Encounter (Signed)
Ok to stop zoloft.  Would start lexapro (escitalopram) 10mg  daily - new dose sent to pharmacy. This med is similar to celexa but may have less interaction with omeprazole.  Spoke with wife heather.

## 2015-11-11 ENCOUNTER — Encounter: Payer: Self-pay | Admitting: Family Medicine

## 2015-11-11 ENCOUNTER — Ambulatory Visit (INDEPENDENT_AMBULATORY_CARE_PROVIDER_SITE_OTHER): Payer: 59 | Admitting: Family Medicine

## 2015-11-11 VITALS — BP 144/94 | HR 101 | Ht 72.5 in | Wt 237.0 lb

## 2015-11-11 DIAGNOSIS — E669 Obesity, unspecified: Secondary | ICD-10-CM | POA: Diagnosis not present

## 2015-11-11 DIAGNOSIS — R5382 Chronic fatigue, unspecified: Secondary | ICD-10-CM | POA: Diagnosis not present

## 2015-11-11 DIAGNOSIS — G4700 Insomnia, unspecified: Secondary | ICD-10-CM | POA: Diagnosis not present

## 2015-11-11 DIAGNOSIS — Z6379 Other stressful life events affecting family and household: Secondary | ICD-10-CM

## 2015-11-11 DIAGNOSIS — R5383 Other fatigue: Secondary | ICD-10-CM | POA: Insufficient documentation

## 2015-11-11 DIAGNOSIS — F411 Generalized anxiety disorder: Secondary | ICD-10-CM

## 2015-11-11 DIAGNOSIS — Z720 Tobacco use: Secondary | ICD-10-CM

## 2015-11-11 MED ORDER — ESCITALOPRAM OXALATE 10 MG PO TABS: 5.0000 mg | ORAL_TABLET | Freq: Every day | ORAL | 6 refills | Status: DC

## 2015-11-11 NOTE — Patient Instructions (Addendum)
Guided meditation for detachment from overthinking by Kristopher Glee; hypnosis for clearing subconscious negativity by Kristopher Glee guided meditation for sleep floating amongst the stars by Romero Belling   Start lexapro- one half a tablet daily for 1 week and then may increase to 1 tablet  Exercise moderate intensity daily for goal of at least.      Adjustment Disorder Adjustment disorder is an unusually severe reaction to a stressful life event, such as the loss of a job or physical illness. The event may be any stressful event other than the loss of a loved one. Adjustment disorder may affect your feelings, your thinking, how you act, or a combination of these. It may interfere with personal relationships or with the way you are at work, school, or home. People with this disorder are at risk for suicide and substance abuse. They may develop a more serious mental disorder, such as major depressive disorder or post-traumatic stress disorder. SIGNS AND SYMPTOMS  Symptoms may include:  Sadness, depressed mood, or crying spells.  Loss of enjoyment.  Change in appetite or weight.  Sense of loss or hopelessness.  Thoughts of suicide.  Anxiety, worry, or nervousness.  Trouble sleeping.  Avoiding family and friends.  Poor school performance.  Fighting or vandalism.  Reckless driving.  Skipping school.  Poor work International aid/development worker.  Ignoring bills. Symptoms of adjustment disorder start within 3 months of the stressful life event. They do not last more than 6 months after the event has ended. DIAGNOSIS  To make a diagnosis, your health care provider will ask about what has happened in your life and how it has affected you. He or she may also ask about your medical history and use of medicines, alcohol, and other substances. Your health care provider may do a physical exam and order lab tests or other studies. You may be referred to a mental health specialist for  evaluation. TREATMENT  Treatment options include:  Counseling or talk therapy. Talk therapy is usually provided by mental health specialists.  Medicine. Certain medicines may help with depression, anxiety, and sleep.  Support groups. Support groups offer emotional support, advice, and guidance. They are made up of people who have had similar experiences. HOME CARE INSTRUCTIONS  Keep all follow-up visits as directed by your health care provider. This is important.  Take medicines only as directed by your health care provider. SEEK MEDICAL CARE IF:  Your symptoms get worse.  SEEK IMMEDIATE MEDICAL CARE IF: You have serious thoughts about hurting yourself or someone else. MAKE SURE YOU:  Understand these instructions.  Will watch your condition.  Will get help right away if you are not doing well or get worse.   This information is not intended to replace advice given to you by your health care provider. Make sure you discuss any questions you have with your health care provider.   Document Released: 12/01/2005 Document Revised: 04/19/2014 Document Reviewed: 08/21/2013 Elsevier Interactive Patient Education 2016 [employer]   Posttraumatic Stress Disorder Posttraumatic stress disorder (PTSD) is a mental disorder. It occurs after a traumatic event in your life. The traumatic events that cause PTSD are outside the range of normal human experience. Examples of these events include war, automobile accidents, natural disasters, rape, domestic violence, and violent crimes. Most people who experience these types of events are able to heal on their own. Those who do not heal develop PTSD. PTSD can happen to anyone at any age. However, people with  a history of childhood abuse are at increased risk for developing PTSD.  SYMPTOMS  The traumatic event that causes PTSD must be a threat to life, cause serious injury, or involve sexual violence. The traumatic event is usually experienced directly  by the person who develops PTSD. Sometimes PTSD occurs in people who witness traumas that occur to others or who hear about a trauma that occurs to a close family member or friend. The following behaviors are characteristic of people with PTSD:  People with PTSD re-experience the traumatic event in one or more of the following ways (intrusion symptoms):  Recurrent, unwanted distressing memories while awake.  Recurrent distressing dreams.  Sensations similar to those felt when the event originally occurred (flashbacks).   Intense or prolonged emotional distress, triggered by reminders of the trauma. This may include fear, horror, intense sadness, or anger.  Marked physical reactions, triggered by reminders of the trauma. This may include racing heart, shortness of breath, sweating, and shaking.  People with PTSD avoid thoughts, conversations, people, or activities that remind them of the traumatic event (avoidance symptoms).  People with PTSD have negative changes in their thinking and mood after the traumatic event. These changes include:  Inability to remember one or more significant aspects of the traumatic event (memory gaps).  Exaggerated negative perceptions about themselves or others, such as believing that they are bad people or that no one can be trusted.  Unrealistic assignment of blame to themselves or others for the traumatic event.  Persistent negative emotional state, such as fear, horror, anger, sadness, guilt, or shame.  Markedly decreased interest or participation in significant activities.  A loss of connection with other people.  Inability to experience positive emotions, such as happiness or love.  People with PTSD are more sensitive to their environment and react more easily than others (hyperarousal-overreactivity symptoms). These symptoms include:  Irritability, with angry outbursts toward other people or objects. The outbursts are easily triggered and may be  verbal or physical.  Careless or self-destructive behavior. This may include reckless driving or drug use.  A feeling of being on edge, with increased alertness (hypervigilance).  Exaggerated reactions to stimuli, such as being easily startled.   Difficulty concentrating.  Difficulty sleeping. PTSD symptoms may start soon after a frightening event or months or years later. They last at least 1 month or longer and can affect one or more areas of functioning, such as social or occupational functioning.  DIAGNOSIS  PTSD is diagnosed through an assessment by a mental health professional. Bonita Quin will be asked questions about the traumatic events in your life. You will also be asked about how these events have changed your thoughts, mood, behavior, and ability to function on a daily basis. You may be asked about your use of alcohol or drugs, which can make PTSD symptoms worse. TREATMENT  Unlike many mental disorders, which require lifelong management, PTSD is a curable condition. The goal of PTSD treatment is to neutralize the negative effects of the traumatic event on daily functioning, not erase the memory of the event. The following treatments may be prescribed to reach this goal:  Medicines. Certain medicines can reduce some PTSD symptoms. Intrusion symptoms and hyperarousal-overactivity symptoms respond best to medicines.  Counseling (talk therapy). Talk therapy with a mental health professional who is experienced in treating PTSD can help. Talk therapy can provide education, emotional support, and coping skills. Certain types of talk therapy that specifically target the traumatic events are the most effective treatment for  PTSD:  Prolonged exposure therapy, which involves remembering and processing the traumatic event with a therapist in a safe environment until it no longer creates a negative emotional response.  Eye movement desensitization and reprocessing therapy, which involves the use of  repetitive physical stimulation of the senses that alternates between the right and left sides of the body. It is believed that this therapy facilitates communication between the two sides of the brain. This communication helps the mind to integrate the fragmented memories of the traumatic event into a whole story that makes sense and no longer creates a negative emotional response. Most people with PTSD benefit from a combination of these treatments.    This information is not intended to replace advice given to you by your health care provider. Make sure you discuss any questions you have with your health care provider.   Document Released: 12/22/2000 Document Revised: 04/19/2014 Document Reviewed: 06/15/2012 Elsevier Interactive Patient Education [employer]

## 2015-11-11 NOTE — Progress Notes (Signed)
New patient office visit note:  Impression and Recommendations:    mild Fatigue Patient has concerns about his fatigue especially after a long workday. He does not feel like exercising or doing much of anything. He is worried that he might have a thyroid or some other type of problem.  Educated about PTSD and generalized anxiety disorder having a wide array of symptoms including fatigue and lack of motivation, especially with added Ativan use with alcohol.  We'll obtain fasting blood work in near future.  Insomnia Advised patient against Ativan/ or any chronic benzo use. He does not use them regularly only sporadically, thus withdrawal is not an issue    Long discussion with patient regarding addiction and physical tolerance issues, and detriments associated with long-term use of this medicine including personality changes, memory and cognitive dysfunction, sleep disorders etc.  Advised sleep meditation recordings from YouTube and various relaxation techniques. Melatonin daily at bedtime up to 3 mg nightly and proper sleep hygiene discussed  Stressful life event affecting family Advise healthy eating and regular exercise of 30 minutes 5-7 days per week of moderate intensity exercise.  Tobacco abuse Encourage smoking cessation. Patient declines at this time due to other stressors in his life.  Generalized anxiety disorder zoloft - caused insomnia, sexual dysfunction  celexa - worked well but interacted with omeprazole  After long discussion of various SSRIs risks and benefits, we will do a trial of Lexapro. He'll start with a half tablet daily and slowly increase dose over time.  Obese Prudent diet and exercise counseling done    Patient's Medications  New Prescriptions   No medications on file  Previous Medications   LORAZEPAM (ATIVAN) 1 MG TABLET    Take 1 tablet (1 mg total) by mouth 2 (two) times daily as needed.   OMEPRAZOLE (PRILOSEC) 20 MG CAPSULE    Take  1 capsule (20 mg total) by mouth 2 (two) times daily before a meal.  Modified Medications   Modified Medication Previous Medication   ESCITALOPRAM (LEXAPRO) 10 MG TABLET escitalopram (LEXAPRO) 10 MG tablet      Take 0.5 tablets (5 mg total) by mouth daily.    Take 1 tablet (10 mg total) by mouth daily.  Discontinued Medications   MELATONIN 3 MG TABS    Take 1 tablet by mouth at bedtime as needed.   VARENICLINE (CHANTIX CONTINUING MONTH PAK) 1 MG TABLET    One half tab every morning    Return in about 3 weeks (around 12/02/2015), or f/up for start lexapro, for fasting blood work then one week later f/up with me.  The patient was counseled, risk factors were discussed, anticipatory guidance given.  Gross side effects, risk and benefits, and alternatives of medications discussed with patient.  Patient is aware that all medications have potential side effects and we are unable to predict every side effect or drug-drug interaction that may occur.  Expresses verbal understanding and consents to current therapy plan and treatment regimen.  Please see AVS handed out to patient at the end of our visit for further patient instructions/ counseling done pertaining to today's office visit.    Note: This document was prepared using Dragon voice recognition software and may include unintentional dictation errors.  ----------------------------------------------------------------------------------------------------------------------    Subjective:    Chief Complaint  Patient presents with  . Establish Care    HPI: Albert Hall is a pleasant 30 y.o. male who presents to [facility]  at [facility] today to review their medical history with me and establish care.    [employer] for total 5 yrs and spend 1 yr in combat in [place]-  Married 9 yrs.  Presents to the clinic today with his wife.   Works at [employer].  Primarily concerned about his PTSD-  zoloft- killed sex life;    celexa  -worked well-  made protonix not work- heartburn got worse. Will   Heartburn- omeprazole  BID,  Failed protonix  Insomnia- melatonin now. Lorazepam in past.  Has irreg schedule for work esp on wkends. Trazadone- failed it as well.   only uses lorazepam occasionally, for social anxiety problems. Maybe once a week if that.  Has h/o daily use with increasing tolerance and need for higher doses.   Current Tob user- 3/4ppd for 13 yrs.  Obesity: Has struggled with weight ever since he came out of the [employer].  No longer with regular exercise     Patient Active Problem List   Diagnosis Date Noted  . mild Fatigue 11/11/2015  . Stressful life event affecting family 09/16/2015  . Nausea vomiting and diarrhea 09/16/2015  . GERD (gastroesophageal reflux disease)   . Tobacco abuse 02/11/2014  . Insomnia 01/08/2014  . Generalized anxiety disorder 01/08/2014     Past Medical History:  Diagnosis Date  . Anxiety   . Generalized anxiety disorder 01/08/2014  . GERD (gastroesophageal reflux disease)   . Insomnia 01/08/2014  . Male sexual dysfunction   . Tobacco abuse 02/11/2014     History reviewed. No pertinent surgical history.   Family History  Problem Relation Age of Onset  . Asthma Father   . Asthma Sister   . Diabetes Paternal Uncle   . Cancer Neg Hx   . Stroke Neg Hx   . CAD Neg Hx      History  Drug Use No    History  Alcohol Use  . 4.2 - 4.8 oz/week  . 7 Cans of beer per week    Comment: 1 beer nightly    History  Smoking Status  . Current Every Day Smoker  . Packs/day: 0.25  . Years: 3.00  . Types: Cigarettes  Smokeless Tobacco  . Former Engineer, mining Medications  New Prescriptions   No medications on file  Previous Medications   LORAZEPAM (ATIVAN) 1 MG TABLET    Take 1 tablet (1 mg total) by mouth 2 (two) times daily as needed.   OMEPRAZOLE (PRILOSEC) 20 MG CAPSULE    Take 1 capsule (20 mg total) by mouth 2 (two) times daily before a meal.    Modified Medications   Modified Medication Previous Medication   ESCITALOPRAM (LEXAPRO) 10 MG TABLET escitalopram (LEXAPRO) 10 MG tablet      Take 0.5 tablets (5 mg total) by mouth daily.    Take 1 tablet (10 mg total) by mouth daily.  Discontinued Medications   MELATONIN 3 MG TABS    Take 1 tablet by mouth at bedtime as needed.   VARENICLINE (CHANTIX CONTINUING MONTH PAK) 1 MG TABLET    One half tab every morning    Allergies: Penicillins and Trazodone and nefazodone  Review of Systems:   ( Completed via Adult Medical History Intake form today ) General:  Denies fever, chills, appetite changes, unexplained weight loss.  Optho/Auditory:   Denies visual changes, blurred vision/LOV, ringing in ears/ diff hearing Respiratory:   Denies SOB, DOE, cough, wheezing.  Cardiovascular:   Denies chest  pain, palpitations, new onset peripheral edema  Gastrointestinal:   Denies nausea, vomiting, diarrhea.  Genitourinary:    Denies dysuria, increased frequency, flank pain.  Endocrine:     Denies hot or cold intolerance, polyuria, polydipsia. Musculoskeletal:  Denies unexplained myalgias, joint swelling, arthralgias, gait problems.  Skin:  Denies rash, suspicious lesions or new/ changes in moles Neurological:    Denies dizziness, syncope, unexplained weakness, lightheadedness, numbness  Psychiatric/Behavioral:   Denies mood changes, suicidal or homicidal ideations, hallucinations    Objective:   Blood pressure 144/94, pulse 89, height 6' 0.5" (1.842 m), weight 237 lb (107.5 kg). Body mass index is 31.7 kg/m.   General: Well Developed, well nourished, and in no acute distress.  Neuro: Alert and oriented x3, extra-ocular muscles intact, sensation grossly intact.  HEENT: Normocephalic, atraumatic, pupils equal round reactive to light, neck supple, no gross masses, no carotid bruits, no JVD apprec Skin: no gross suspicious lesions or rashes  Cardiac: Regular rate and rhythm, no murmurs rubs or  gallops.  Respiratory: Essentially clear to auscultation bilaterally. Not using accessory muscles, speaking in full sentences.  Abdominal: Soft, not grossly distended Musculoskeletal: Ambulates w/o diff, FROM * 4 ext.  Vasc: less 2 sec cap RF, warm and pink  Psych:  No HI/SI, judgement and insight good.

## 2015-11-15 DIAGNOSIS — E669 Obesity, unspecified: Secondary | ICD-10-CM | POA: Insufficient documentation

## 2015-11-15 NOTE — Assessment & Plan Note (Addendum)
zoloft - caused insomnia, sexual dysfunction  celexa - worked well but interacted with omeprazole  After long discussion of various SSRIs risks and benefits, we will do a trial of Lexapro. He'll start with a half tablet daily and slowly increase dose over time.

## 2015-11-15 NOTE — Assessment & Plan Note (Addendum)
Patient has concerns about his fatigue especially after a long workday. He does not feel like exercising or doing much of anything. He is worried that he might have a thyroid or some other type of problem.  Educated about PTSD and generalized anxiety disorder having a wide array of symptoms including fatigue and lack of motivation, especially with added Ativan use with alcohol.  We'll obtain fasting blood work in near future.

## 2015-11-15 NOTE — Assessment & Plan Note (Signed)
Encourage smoking cessation. Patient declines at this time due to other stressors in his life.

## 2015-11-15 NOTE — Assessment & Plan Note (Signed)
Prudent diet and exercise counseling done

## 2015-11-15 NOTE — Assessment & Plan Note (Signed)
Advise healthy eating and regular exercise of 30 minutes 5-7 days per week of moderate intensity exercise.

## 2015-11-15 NOTE — Assessment & Plan Note (Addendum)
Advised patient against Ativan/ or any chronic benzo use.    Long discussion with patient regarding addiction and physical tolerance issues, and detriments associated with long-term use of this medicine including personality changes, memory and cognitive dysfunction, sleep disorders etc.  Advised sleep meditation recordings from YouTube and various relaxation techniques. Melatonin daily at bedtime up to 3 mg nightly and proper sleep hygiene discussed

## 2015-11-17 ENCOUNTER — Other Ambulatory Visit (INDEPENDENT_AMBULATORY_CARE_PROVIDER_SITE_OTHER): Payer: 59

## 2015-11-17 DIAGNOSIS — Z1321 Encounter for screening for nutritional disorder: Secondary | ICD-10-CM | POA: Diagnosis not present

## 2015-11-17 DIAGNOSIS — Z13220 Encounter for screening for lipoid disorders: Secondary | ICD-10-CM

## 2015-11-17 DIAGNOSIS — R5383 Other fatigue: Secondary | ICD-10-CM

## 2015-11-18 LAB — COMPREHENSIVE METABOLIC PANEL
ALT: 39 U/L (ref 9–46)
AST: 23 U/L (ref 10–40)
Albumin: 4.3 g/dL (ref 3.6–5.1)
Alkaline Phosphatase: 110 U/L (ref 40–115)
BILIRUBIN TOTAL: 0.5 mg/dL (ref 0.2–1.2)
BUN: 9 mg/dL (ref 7–25)
CO2: 26 mmol/L (ref 20–31)
Calcium: 8.6 mg/dL (ref 8.6–10.3)
Chloride: 104 mmol/L (ref 98–110)
Creat: 0.79 mg/dL (ref 0.60–1.35)
GLUCOSE: 102 mg/dL — AB (ref 65–99)
Potassium: 4.2 mmol/L (ref 3.5–5.3)
Sodium: 142 mmol/L (ref 135–146)
Total Protein: 6.3 g/dL (ref 6.1–8.1)

## 2015-11-18 LAB — CBC WITH DIFFERENTIAL/PLATELET
BASOS PCT: 1 %
Basophils Absolute: 83 cells/uL (ref 0–200)
EOS PCT: 4 %
Eosinophils Absolute: 332 cells/uL (ref 15–500)
HCT: 46.4 % (ref 38.5–50.0)
Hemoglobin: 15.8 g/dL (ref 13.2–17.1)
LYMPHS PCT: 30 %
Lymphs Abs: 2490 cells/uL (ref 850–3900)
MCH: 31 pg (ref 27.0–33.0)
MCHC: 34.1 g/dL (ref 32.0–36.0)
MCV: 91 fL (ref 80.0–100.0)
MPV: 10.6 fL (ref 7.5–12.5)
Monocytes Absolute: 581 cells/uL (ref 200–950)
Monocytes Relative: 7 %
NEUTROS PCT: 58 %
Neutro Abs: 4814 cells/uL (ref 1500–7800)
PLATELETS: 254 10*3/uL (ref 140–400)
RBC: 5.1 MIL/uL (ref 4.20–5.80)
RDW: 14.4 % (ref 11.0–15.0)
WBC: 8.3 10*3/uL (ref 3.8–10.8)

## 2015-11-18 LAB — LIPID PANEL
Cholesterol: 175 mg/dL (ref 125–200)
HDL: 51 mg/dL (ref >=40)
LDL Cholesterol: 97 mg/dL (ref <130)
Total CHOL/HDL Ratio: 3.4 Ratio (ref <=5.0)
Triglycerides: 134 mg/dL (ref <150)
VLDL: 27 mg/dL (ref <30)

## 2015-11-18 LAB — TSH: TSH: 1.56 m[IU]/L (ref 0.40–4.50)

## 2015-11-18 LAB — VITAMIN D 25 HYDROXY (VIT D DEFICIENCY, FRACTURES): VIT D 25 HYDROXY: 17 ng/mL — AB (ref 30–100)

## 2015-11-21 ENCOUNTER — Ambulatory Visit: Payer: Self-pay | Admitting: Family Medicine

## 2015-12-03 DIAGNOSIS — R400 Somnolence: Secondary | ICD-10-CM | POA: Insufficient documentation

## 2015-12-08 ENCOUNTER — Ambulatory Visit (INDEPENDENT_AMBULATORY_CARE_PROVIDER_SITE_OTHER): Payer: 59 | Admitting: Family Medicine

## 2015-12-08 ENCOUNTER — Encounter: Payer: Self-pay | Admitting: Family Medicine

## 2015-12-08 VITALS — BP 134/89 | HR 89 | Ht 72.5 in | Wt 241.1 lb

## 2015-12-08 DIAGNOSIS — Z720 Tobacco use: Secondary | ICD-10-CM

## 2015-12-08 DIAGNOSIS — R030 Elevated blood-pressure reading, without diagnosis of hypertension: Secondary | ICD-10-CM

## 2015-12-08 DIAGNOSIS — F411 Generalized anxiety disorder: Secondary | ICD-10-CM

## 2015-12-08 DIAGNOSIS — Z6379 Other stressful life events affecting family and household: Secondary | ICD-10-CM

## 2015-12-08 DIAGNOSIS — E669 Obesity, unspecified: Secondary | ICD-10-CM

## 2015-12-08 DIAGNOSIS — R5383 Other fatigue: Secondary | ICD-10-CM

## 2015-12-08 DIAGNOSIS — R7309 Other abnormal glucose: Secondary | ICD-10-CM | POA: Diagnosis not present

## 2015-12-08 DIAGNOSIS — G4700 Insomnia, unspecified: Secondary | ICD-10-CM

## 2015-12-08 DIAGNOSIS — E559 Vitamin D deficiency, unspecified: Secondary | ICD-10-CM

## 2015-12-08 DIAGNOSIS — R739 Hyperglycemia, unspecified: Secondary | ICD-10-CM

## 2015-12-08 LAB — POCT GLYCOSYLATED HEMOGLOBIN (HGB A1C): HEMOGLOBIN A1C: 5

## 2015-12-08 MED ORDER — ESCITALOPRAM OXALATE 10 MG PO TABS: 10.0000 mg | ORAL_TABLET | Freq: Every day | ORAL | 1 refills | Status: DC

## 2015-12-08 MED ORDER — VITAMIN D (ERGOCALCIFEROL) 1.25 MG (50000 UNIT) PO CAPS: 50000.0000 [IU] | ORAL_CAPSULE | ORAL | 10 refills | Status: DC

## 2015-12-08 NOTE — Assessment & Plan Note (Signed)
He cut cut back to 1/2 ppd now. Which is great, but he is not ready to quit today.

## 2015-12-08 NOTE — Progress Notes (Signed)
Assessment and plan:  1. Insomnia   2. Generalized anxiety disorder/ PTSD   3. Stressful life event affecting family   4. Vitamin D deficiency   5. Elevated blood sugar   6. Tobacco abuse   7. Pre-HTN   8. BMI >er 30   9. Other fatigue    Insomnia is much improved with taking melatonin and doing his meditation nightly. Highly        encouraged to continue meditation. Also encouraged exercise during the day.  Anxiety is improved on the Lexapro. He appears much calmer in the office today. --- Increase from 5-10 mg  Discussed vitamin D at 17 and implications of that. He will get 8 weekly and daily supplementation. I want him to increase his daily to 5000 international units daily. Recheck in 4-6 months.  Elevated blood sugar-Blood sugar was elevated and fasting labs so we did a point-of-care A1c in the office which was 5.0.   patient is very happy to see that his A1c is completely normal. Feels relieved especially due to strong family history. He has started to cut back on diet and he needs to continue.  Tobacco abuse He cut cut back to 1/2 ppd now. Which is great, but he is not ready to quit today.  F/up 2 mo--> re: inc in lexapro    Patient's Medications  New Prescriptions   VITAMIN D, ERGOCALCIFEROL, (DRISDOL) 50000 UNITS CAPS CAPSULE    Take 1 capsule (50,000 Units total) by mouth every 7 (seven) days.  Previous Medications   LORAZEPAM (ATIVAN) 1 MG TABLET    Take 1 tablet (1 mg total) by mouth 2 (two) times daily as needed.   MELATONIN 3-10 MG TABS    Take 3 mg by mouth at bedtime. 2 tbs nightly   OMEPRAZOLE (PRILOSEC) 20 MG CAPSULE    Take 1 capsule (20 mg total) by mouth 2 (two) times daily before a meal.   VITAMIN D, CHOLECALCIFEROL, 1000 UNITS CAPS    Take 2 capsules by mouth daily.  Modified Medications   Modified Medication Previous Medication   ESCITALOPRAM (LEXAPRO) 10 MG TABLET escitalopram  (LEXAPRO) 10 MG tablet      Take 1 tablet (10 mg total) by mouth daily.    Take 0.5 tablets (5 mg total) by mouth daily.  Discontinued Medications   No medications on file    Return in about 2 months (around 02/07/2016) for lexapro/  PTSD & insomnia.  Anticipatory guidance and routine counseling done re: condition, txmnt options and need for follow up. All questions of patient's were answered.   Gross side effects, risk and benefits, and alternatives of medications discussed with patient.  Patient is aware that all medications have potential side effects and we are unable to predict every sideeffect or drug-drug interaction that may occur.  Expresses verbal understanding and consents to current therapy plan and treatment regiment.  Please see AVS handed out to patient at the end of our visit for additional patient instructions/ counseling done pertaining to today's office visit.  Note: This document was prepared using Dragon voice recognition software and may include unintentional dictation errors.   ----------------------------------------------------------------------------------------------------------------------  Subjective:   CC: Albert Hall is a 30 y.o. male who presents to [facility] today for recent fasting lab review and see how he is doing on lexapro that we started as a new medicine last office visit.   Taking 1/2 tab lexapro  daily at night-  Not noticing much change with it.  Tolerated well.   Down to one NOS soda per day---> down from 5-6 /day  Sleeping better with meditation from you-tube.  Not needing his Ativan.  Blood sugar was elevated and fasting labs so we did a point-of-care A1c in the office which was 5.0      Wt Readings from Last 3 Encounters:  12/08/15 241 lb 1.6 oz (109.4 kg)  11/11/15 237 lb (107.5 kg)  10/17/15 237 lb (107.5 kg)   BP Readings from Last 3 Encounters:  12/08/15 134/89  11/11/15 (!) 144/94    10/17/15 134/86   Pulse Readings from Last 3 Encounters:  12/08/15 89  11/11/15 (!) 101  10/17/15 76      Full medical history updated and reviewed in the office today  Patient Active Problem List   Diagnosis Date Noted  . Vitamin D deficiency 12/08/2015  . Pre-HTN 12/08/2015  . BMI >er 30 11/15/2015  . mild Fatigue 11/11/2015  . Stressful life event affecting family 09/16/2015  . GERD (gastroesophageal reflux disease)   . Tobacco abuse 02/11/2014  . Insomnia 01/08/2014  . Generalized anxiety disorder/ PTSD 01/08/2014    Past Medical History:  Diagnosis Date  . Anxiety   . Generalized anxiety disorder 01/08/2014  . GERD (gastroesophageal reflux disease)   . Insomnia 01/08/2014  . Male sexual dysfunction   . Tobacco abuse 02/11/2014    History reviewed. No pertinent surgical history.  Social History  Substance Use Topics  . Smoking status: Current Every Day Smoker    Packs/day: 0.25    Years: 3.00    Types: Cigarettes  . Smokeless tobacco: Former Systems developer  . Alcohol use 4.2 - 4.8 oz/week    7 Cans of beer per week     Comment: 1 beer nightly    family history includes Asthma in his father and sister; Diabetes in his paternal uncle.   Medications: Current Outpatient Prescriptions  Medication Sig Dispense Refill  . escitalopram (LEXAPRO) 10 MG tablet Take 1 tablet (10 mg total) by mouth daily. 90 tablet 1  . Melatonin 3-10 MG TABS Take 3 mg by mouth at bedtime. 2 tbs nightly    . omeprazole (PRILOSEC) 20 MG capsule Take 1 capsule (20 mg total) by mouth 2 (two) times daily before a meal. 60 capsule 11  . Vitamin D, Cholecalciferol, 1000 units CAPS Take 2 capsules by mouth daily.    Marland Kitchen LORazepam (ATIVAN) 1 MG tablet Take 1 tablet (1 mg total) by mouth 2 (two) times daily as needed. (Patient not taking: Reported on 12/08/2015) 60 tablet 0  . Vitamin D, Ergocalciferol, (DRISDOL) 50000 units CAPS capsule Take 1 capsule (50,000 Units total) by mouth every 7 (seven) days.  12 capsule 10   No current facility-administered medications for this visit.     Allergies:  Allergies  Allergen Reactions  . Penicillins Anaphylaxis  . Trazodone And Nefazodone Other (See Comments)    Affected hearing, malaise     ROS:  Const:    no fevers, chills Eyes:    conjunctiva clear, no vision changes or blurred vision ENT:  no hearing difficulties, no dysphagia, no dysphonia, no nose bleeds CV:   no chest pain, arrhythmias, no orthopnea, no PND Pulm:   no SOB at rest or exertion, no Wheeze, no DIB, no hemoptysis GI:    no N/V/D/C, no abd pain GU:   no blood in urine or inc freq or urgency Heme/Onc:  no unexplained bleeding, no night sweats, no more fatigue than usual Neuro:   No dizziness, no LOC, No unexplained weakness or numbness Endo:   no unexplained wt loss or gain M-Sk:   no localized myalgias or arthralgias Psych:    No SI/HI, no memory prob or unexplained confusion    Objective:  Blood pressure 134/89, pulse 89, height 6' 0.5" (1.842 m), weight 241 lb 1.6 oz (109.4 kg). Body mass index is 32.25 kg/m.  Gen:   Well NAD, A and O *3 HEENT:    Bland/AT, EOMI,  MMM, OP- clr Lungs:   Normal work of breathing. CTA B/L, no Wh, rhonchi Heart:   RRR, S1, S2 WNL's, no MRG Abd:   Soft. No gross distention Exts:    warm, pink,  Brisk capillary refill, warm and well perfused.  Psych:    No HI/SI, judgement and insight good, Euthymic mood. Full Affect.   Recent Results (from the past 2160 hour(s))  CBC with Differential/Platelet     Status: None   Collection Time: 11/17/15 10:55 AM  Result Value Ref Range   WBC 8.3 3.8 - 10.8 K/uL   RBC 5.10 4.20 - 5.80 MIL/uL   Hemoglobin 15.8 13.2 - 17.1 g/dL   HCT 46.4 38.5 - 50.0 %   MCV 91.0 80.0 - 100.0 fL   MCH 31.0 27.0 - 33.0 pg   MCHC 34.1 32.0 - 36.0 g/dL   RDW 14.4 11.0 - 15.0 %   Platelets 254 140 - 400 K/uL   MPV 10.6 7.5 - 12.5 fL   Neutro Abs 4,814 1,500 - 7,800 cells/uL   Lymphs Abs 2,490 850 - 3,900  cells/uL   Monocytes Absolute 581 200 - 950 cells/uL   Eosinophils Absolute 332 15 - 500 cells/uL   Basophils Absolute 83 0 - 200 cells/uL   Neutrophils Relative % 58 %   Lymphocytes Relative 30 %   Monocytes Relative 7 %   Eosinophils Relative 4 %   Basophils Relative 1 %   Smear Review SEE NOTE     Comment: Atypical lymphs. Polychromasia present (1-2/hpf) Platelets are unremarkable. ** Please note change in unit of measure and reference range(s). **   Comp Met (CMET)     Status: Abnormal   Collection Time: 11/17/15 10:55 AM  Result Value Ref Range   Sodium 142 135 - 146 mmol/L   Potassium 4.2 3.5 - 5.3 mmol/L   Chloride 104 98 - 110 mmol/L   CO2 26 20 - 31 mmol/L   Glucose, Bld 102 (H) 65 - 99 mg/dL   BUN 9 7 - 25 mg/dL   Creat 0.79 0.60 - 1.35 mg/dL   Total Bilirubin 0.5 0.2 - 1.2 mg/dL   Alkaline Phosphatase 110 40 - 115 U/L   AST 23 10 - 40 U/L   ALT 39 9 - 46 U/L   Total Protein 6.3 6.1 - 8.1 g/dL   Albumin 4.3 3.6 - 5.1 g/dL   Calcium 8.6 8.6 - 10.3 mg/dL  Lipid panel     Status: None   Collection Time: 11/17/15 10:55 AM  Result Value Ref Range   Cholesterol 175 125 - 200 mg/dL   Triglycerides 134 <150 mg/dL   HDL 51 >=40 mg/dL   Total CHOL/HDL Ratio 3.4 <=5.0 Ratio   VLDL 27 <30 mg/dL   LDL Cholesterol 97 <130 mg/dL    Comment:   Total Cholesterol/HDL Ratio:CHD Risk  Coronary Heart Disease Risk Table                                        Men       Women          1/2 Average Risk              3.4        3.3              Average Risk              5.0        4.4           2X Average Risk              9.6        7.1           3X Average Risk             23.4       11.0 Use the calculated Patient Ratio above and the CHD Risk table  to determine the patient's CHD Risk.   TSH     Status: None   Collection Time: 11/17/15 10:55 AM  Result Value Ref Range   TSH 1.56 0.40 - 4.50 mIU/L  VITAMIN D 25 Hydroxy (Vit-D Deficiency, Fractures)      Status: Abnormal   Collection Time: 11/17/15 10:55 AM  Result Value Ref Range   Vit D, 25-Hydroxy 17 (L) 30 - 100 ng/mL    Comment: Vitamin D Status           25-OH Vitamin D        Deficiency                <20 ng/mL        Insufficiency         20 - 29 ng/mL        Optimal             > or = 30 ng/mL   For 25-OH Vitamin D testing on patients on D2-supplementation and patients for whom quantitation of D2 and D3 fractions is required, the [facility] 25-OH VIT D, (D2,D3), LC/MS/MS is recommended: order code [id number] (patients > 2 yrs).   POCT glycosylated hemoglobin (Hb A1C)     Status: Normal   Collection Time: 12/08/15  1:22 PM  Result Value Ref Range   Hemoglobin A1C 5.0

## 2015-12-08 NOTE — Patient Instructions (Addendum)
Heart-Healthy Eating Plan Many factors influence your heart health, including eating and exercise habits. Heart (coronary) risk increases with abnormal blood fat (lipid) levels. Heart-healthy meal planning includes limiting unhealthy fats, increasing healthy fats, and making other small dietary changes. This includes maintaining a healthy body weight to help keep lipid levels within a normal range. WHAT IS MY PLAN?  Your health care provider recommends that you:  Get no more than  25% of the total calories in your daily diet from fat.  Limit your intake of saturated fat to less than  5% of your total calories each day.  Limit the amount of cholesterol in your diet to less than 100 mg per day. WHAT TYPES OF FAT SHOULD I CHOOSE?  Choose healthy fats more often. Choose monounsaturated and polyunsaturated fats, such as olive oil and canola oil, flaxseeds, walnuts, almonds, and seeds.  Eat more omega-3 fats. Good choices include salmon, mackerel, sardines, tuna, flaxseed oil, and ground flaxseeds. Aim to eat fish at least two times each week.  Limit saturated fats. Saturated fats are primarily found in animal products, such as meats, butter, and cream. Plant sources of saturated fats include palm oil, palm kernel oil, and coconut oil.  Avoid foods with partially hydrogenated oils in them. These contain trans fats. Examples of foods that contain trans fats are stick margarine, some tub margarines, cookies, crackers, and other baked goods. WHAT GENERAL GUIDELINES DO I NEED TO FOLLOW?  Check food labels carefully to identify foods with trans fats or high amounts of saturated fat.  Fill one half of your plate with vegetables and green salads. Eat 4-5 servings of vegetables per day. A serving of vegetables equals 1 cup of raw leafy vegetables,  cup of raw or cooked cut-up vegetables, or  cup of vegetable juice.  Fill one fourth of your plate with whole grains. Look for the word "whole" as the  first word in the ingredient list.  Fill one fourth of your plate with lean protein foods.  Eat 4-5 servings of fruit per day. A serving of fruit equals one medium whole fruit,  cup of dried fruit,  cup of fresh, frozen, or canned fruit, or  cup of 100% fruit juice.  Eat more foods that contain soluble fiber. Examples of foods that contain this type of fiber are apples, broccoli, carrots, beans, peas, and barley. Aim to get 20-30 g of fiber per day.  Eat more home-cooked food and less restaurant, buffet, and fast food.  Limit or avoid alcohol.  Limit foods that are high in starch and sugar.  Avoid fried foods.  Cook foods by using methods other than frying. Baking, boiling, grilling, and broiling are all great options. Other fat-reducing suggestions include:  Removing the skin from poultry.  Removing all visible fats from meats.  Skimming the fat off of stews, soups, and gravies before serving them.  Steaming vegetables in water or broth.  Lose weight if you are overweight. Losing just 5-10% of your initial body weight can help your overall health and prevent diseases such as diabetes and heart disease.  Increase your consumption of nuts, legumes, and seeds to 4-5 servings per week. One serving of dried beans or legumes equals  cup after being cooked, one serving of nuts equals 1 ounces, and one serving of seeds equals  ounce or 1 tablespoon.  You may need to monitor your salt (sodium) intake, especially if you have high blood pressure. Talk with your health care provider or  dietitian to get more information about reducing sodium. WHAT FOODS CAN I EAT? Grains Breads, including [place], white, pita, wheat, raisin, rye, oatmeal, and [place]. Tortillas that are neither fried nor made with lard or trans fat. Low-fat rolls, including hotdog and hamburger buns and English muffins. Biscuits. Muffins. Waffles. Pancakes. Light popcorn. Whole-grain cereals. Flatbread. Melba toast.  Pretzels. Breadsticks. Rusks. Low-fat snacks and crackers, including oyster, saltine, matzo, graham, animal, and rye. Rice and pasta, including brown rice and those that are made with whole wheat. Vegetables All vegetables. Fruits All fruits, but limit coconut. Meats and Other Protein Sources Lean, well-trimmed beef, veal, pork, and lamb. Chicken and [place] without skin. All fish and shellfish. Wild duck, rabbit, pheasant, and venison. Egg whites or low-cholesterol egg substitutes. Dried beans, peas, lentils, and tofu.Seeds and most nuts. Dairy Low-fat or nonfat cheeses, including ricotta, string, and mozzarella. Skim or 1% milk that is liquid, powdered, or evaporated. Buttermilk that is made with low-fat milk. Nonfat or low-fat yogurt. Beverages Mineral water. Diet carbonated beverages. Sweets and Desserts Sherbets and fruit ices. Honey, jam, marmalade, jelly, and syrups. Meringues and gelatins. Pure sugar candy, such as hard candy, jelly beans, gumdrops, mints, marshmallows, and small amounts of dark chocolate. [employer]. Eat all sweets and desserts in moderation. Fats and Oils Nonhydrogenated (trans-free) margarines. Vegetable oils, including soybean, sesame, sunflower, olive, peanut, safflower, corn, canola, and cottonseed. Salad dressings or mayonnaise that are made with a vegetable oil. Limit added fats and oils that you use for cooking, baking, salads, and as spreads. Other Cocoa powder. Coffee and tea. All seasonings and condiments. The items listed above may not be a complete list of recommended foods or beverages. Contact your dietitian for more options. WHAT FOODS ARE NOT RECOMMENDED? Grains Breads that are made with saturated or trans fats, oils, or whole milk. Croissants. Butter rolls. Cheese breads. Sweet rolls. Donuts. Buttered popcorn. Chow mein noodles. High-fat crackers, such as cheese or butter crackers. Meats and Other Protein Sources Fatty meats, such as hotdogs,  short ribs, sausage, spareribs, bacon, ribeye roast or steak, and mutton. High-fat deli meats, such as salami and bologna. Caviar. Domestic duck and goose. Organ meats, such as kidney, liver, sweetbreads, brains, gizzard, chitterlings, and heart. Dairy Cream, sour cream, cream cheese, and creamed cottage cheese. Whole milk cheeses, including blue (bleu), [address], [place], [place], [address], [place], [address], [place], [place], and [place]. Whole or 2% milk that is liquid, evaporated, or condensed. Whole buttermilk. Cream sauce or high-fat cheese sauce. Yogurt that is made from whole milk. Beverages Regular sodas and drinks with added sugar. Sweets and Desserts Frosting. Pudding. Cookies. Cakes other than angel food cake. Candy that has milk chocolate or white chocolate, hydrogenated fat, butter, coconut, or unknown ingredients. Buttered syrups. Full-fat ice cream or ice cream drinks. Fats and Oils Gravy that has suet, meat fat, or shortening. Cocoa butter, hydrogenated oils, palm oil, coconut oil, palm kernel oil. These can often be found in baked products, candy, fried foods, nondairy creamers, and whipped toppings. Solid fats and shortenings, including bacon fat, salt pork, lard, and butter. Nondairy cream substitutes, such as coffee creamers and sour cream substitutes. Salad dressings that are made of unknown oils, cheese, or sour cream. The items listed above may not be a complete list of foods and beverages to avoid. Contact your dietitian for more information.   This information is not intended to replace advice given to you by your health care provider. Make sure you discuss any questions you have  with your health care provider.   Document Released: 01/06/2008 Document Revised: 04/19/2014 Document Reviewed: 09/20/2013 Elsevier Interactive Patient Education 2016 [employer].      Smoking Cessation, Tips for Success If you are ready to quit smoking, congratulations! You have chosen to  help yourself be healthier. Cigarettes bring nicotine, tar, carbon monoxide, and other irritants into your body. Your lungs, heart, and blood vessels will be able to work better without these poisons. There are many different ways to quit smoking. Nicotine gum, nicotine patches, a nicotine inhaler, or nicotine nasal spray can help with physical craving. Hypnosis, support groups, and medicines help break the habit of smoking. WHAT THINGS CAN I DO TO MAKE QUITTING EASIER?  Here are some tips to help you quit for good:  Pick a date when you will quit smoking completely. Tell all of your friends and family about your plan to quit on that date.  Do not try to slowly cut down on the number of cigarettes you are smoking. Pick a quit date and quit smoking completely starting on that day.  Throw away all cigarettes.   Clean and remove all ashtrays from your home, work, and car.  On a card, write down your reasons for quitting. Carry the card with you and read it when you get the urge to smoke.  Cleanse your body of nicotine. Drink enough water and fluids to keep your urine clear or pale yellow. Do this after quitting to flush the nicotine from your body.  Learn to predict your moods. Do not let a bad situation be your excuse to have a cigarette. Some situations in your life might tempt you into wanting a cigarette.  Never have "just one" cigarette. It leads to wanting another and another. Remind yourself of your decision to quit.  Change habits associated with smoking. If you smoked while driving or when feeling stressed, try other activities to replace smoking. Stand up when drinking your coffee. Brush your teeth after eating. Sit in a different chair when you read the paper. Avoid alcohol while trying to quit, and try to drink fewer caffeinated beverages. Alcohol and caffeine may urge you to smoke.  Avoid foods and drinks that can trigger a desire to smoke, such as sugary or spicy foods and  alcohol.  Ask people who smoke not to smoke around you.  Have something planned to do right after eating or having a cup of coffee. For example, plan to take a walk or exercise.  Try a relaxation exercise to calm you down and decrease your stress. Remember, you may be tense and nervous for the first 2 weeks after you quit, but this will pass.  Find new activities to keep your hands busy. Play with a pen, coin, or rubber band. Doodle or draw things on paper.  Brush your teeth right after eating. This will help cut down on the craving for the taste of tobacco after meals. You can also try mouthwash.   Use oral substitutes in place of cigarettes. Try using lemon drops, carrots, cinnamon sticks, or chewing gum. Keep them handy so they are available when you have the urge to smoke.  When you have the urge to smoke, try deep breathing.  Designate your home as a nonsmoking area.  If you are a heavy smoker, ask your health care provider about a prescription for nicotine chewing gum. It can ease your withdrawal from nicotine.  Reward yourself. Set aside the cigarette money you save and buy yourself  something nice.  Look for support from others. Join a support group or smoking cessation program. Ask someone at home or at work to help you with your plan to quit smoking.  Always ask yourself, "Do I need this cigarette or is this just a reflex?" Tell yourself, "Today, I choose not to smoke," or "I do not want to smoke." You are reminding yourself of your decision to quit.  Do not replace cigarette smoking with electronic cigarettes (commonly called e-cigarettes). The safety of e-cigarettes is unknown, and some may contain harmful chemicals.  If you relapse, do not give up! Plan ahead and think about what you will do the next time you get the urge to smoke. HOW WILL I FEEL WHEN I QUIT SMOKING? You may have symptoms of withdrawal because your body is used to nicotine (the addictive substance in  cigarettes). You may crave cigarettes, be irritable, feel very hungry, cough often, get headaches, or have difficulty concentrating. The withdrawal symptoms are only temporary. They are strongest when you first quit but will go away within 10-14 days. When withdrawal symptoms occur, stay in control. Think about your reasons for quitting. Remind yourself that these are signs that your body is healing and getting used to being without cigarettes. Remember that withdrawal symptoms are easier to treat than the major diseases that smoking can cause.  Even after the withdrawal is over, expect periodic urges to smoke. However, these cravings are generally short lived and will go away whether you smoke or not. Do not smoke! WHAT RESOURCES ARE AVAILABLE TO HELP ME QUIT SMOKING? Your health care provider can direct you to community resources or hospitals for support, which may include:  Group support.  Education.  Hypnosis.  Therapy.   This information is not intended to replace advice given to you by your health care provider. Make sure you discuss any questions you have with your health care provider.   Document Released: 12/26/2003 Document Revised: 04/19/2014 Document Reviewed: 09/14/2012 Elsevier Interactive Patient Education 2016 Elsevier Inc.      DASH Eating Plan DASH stands for "Dietary Approaches to Stop Hypertension." The DASH eating plan is a healthy eating plan that has been shown to reduce high blood pressure (hypertension). Additional health benefits may include reducing the risk of type 2 diabetes mellitus, heart disease, and stroke. The DASH eating plan may also help with weight loss. WHAT DO I NEED TO KNOW ABOUT THE DASH EATING PLAN? For the DASH eating plan, you will follow these general guidelines:  Choose foods with a percent daily value for sodium of less than 5% (as listed on the food label).  Use salt-free seasonings or herbs instead of table salt or sea salt.  Check  with your health care provider or pharmacist before using salt substitutes.  Eat lower-sodium products, often labeled as "lower sodium" or "no salt added."  Eat fresh foods.  Eat more vegetables, fruits, and low-fat dairy products.  Choose whole grains. Look for the word "whole" as the first word in the ingredient list.  Choose fish and skinless chicken or [place] more often than red meat. Limit fish, poultry, and meat to 6 oz (170 g) each day.  Limit sweets, desserts, sugars, and sugary drinks.  Choose heart-healthy fats.  Limit cheese to 1 oz (28 g) per day.  Eat more home-cooked food and less restaurant, buffet, and fast food.  Limit fried foods.  Cook foods using methods other than frying.  Limit canned vegetables. If you do use  them, rinse them well to decrease the sodium.  When eating at a restaurant, ask that your food be prepared with less salt, or no salt if possible. WHAT FOODS CAN I EAT? Seek help from a dietitian for individual calorie needs. Grains Whole grain or whole wheat bread. Brown rice. Whole grain or whole wheat pasta. Quinoa, bulgur, and whole grain cereals. Low-sodium cereals. Corn or whole wheat flour tortillas. Whole grain cornbread. Whole grain crackers. Low-sodium crackers. Vegetables Fresh or frozen vegetables (raw, steamed, roasted, or grilled). Low-sodium or reduced-sodium tomato and vegetable juices. Low-sodium or reduced-sodium tomato sauce and paste. Low-sodium or reduced-sodium canned vegetables.  Fruits All fresh, canned (in natural juice), or frozen fruits. Meat and Other Protein Products Ground beef (85% or leaner), grass-fed beef, or beef trimmed of fat. Skinless chicken or [place]. Ground chicken or [place]. Pork trimmed of fat. All fish and seafood. Eggs. Dried beans, peas, or lentils. Unsalted nuts and seeds. Unsalted canned beans. Dairy Low-fat dairy products, such as skim or 1% milk, 2% or reduced-fat cheeses, low-fat ricotta or cottage  cheese, or plain low-fat yogurt. Low-sodium or reduced-sodium cheeses. Fats and Oils Tub margarines without trans fats. Light or reduced-fat mayonnaise and salad dressings (reduced sodium). Avocado. Safflower, olive, or canola oils. Natural peanut or almond butter. Other Unsalted popcorn and pretzels. The items listed above may not be a complete list of recommended foods or beverages. Contact your dietitian for more options. WHAT FOODS ARE NOT RECOMMENDED? Grains White bread. White pasta. White rice. Refined cornbread. Bagels and croissants. Crackers that contain trans fat. Vegetables Creamed or fried vegetables. Vegetables in a cheese sauce. Regular canned vegetables. Regular canned tomato sauce and paste. Regular tomato and vegetable juices. Fruits Dried fruits. Canned fruit in light or heavy syrup. Fruit juice. Meat and Other Protein Products Fatty cuts of meat. Ribs, chicken wings, bacon, sausage, bologna, salami, chitterlings, fatback, hot dogs, bratwurst, and packaged luncheon meats. Salted nuts and seeds. Canned beans with salt. Dairy Whole or 2% milk, cream, half-and-half, and cream cheese. Whole-fat or sweetened yogurt. Full-fat cheeses or blue cheese. Nondairy creamers and whipped toppings. Processed cheese, cheese spreads, or cheese curds. Condiments Onion and garlic salt, seasoned salt, table salt, and sea salt. Canned and packaged gravies. Worcestershire sauce. Tartar sauce. Barbecue sauce. Teriyaki sauce. Soy sauce, including reduced sodium. Steak sauce. Fish sauce. Oyster sauce. Cocktail sauce. Horseradish. Ketchup and mustard. Meat flavorings and tenderizers. Bouillon cubes. Hot sauce. Tabasco sauce. Marinades. Taco seasonings. Relishes. Fats and Oils Butter, stick margarine, lard, shortening, ghee, and bacon fat. Coconut, palm kernel, or palm oils. Regular salad dressings. Other Pickles and olives. Salted popcorn and pretzels. The items listed above may not be a complete list  of foods and beverages to avoid. Contact your dietitian for more information. WHERE CAN I FIND MORE INFORMATION? National Heart, Lung, and Blood Institute: [employer]   This information is not intended to replace advice given to you by your health care provider. Make sure you discuss any questions you have with your health care provider.   Document Released: 03/18/2011 Document Revised: 04/19/2014 Document Reviewed: 01/31/2013 Elsevier Interactive Patient Education [employer]

## 2016-01-26 ENCOUNTER — Ambulatory Visit: Payer: Self-pay | Admitting: Family Medicine

## 2016-05-23 DIAGNOSIS — K122 Cellulitis and abscess of mouth: Secondary | ICD-10-CM | POA: Diagnosis not present

## 2016-05-23 DIAGNOSIS — J029 Acute pharyngitis, unspecified: Secondary | ICD-10-CM | POA: Diagnosis not present

## 2016-06-05 ENCOUNTER — Emergency Department (HOSPITAL_BASED_OUTPATIENT_CLINIC_OR_DEPARTMENT_OTHER)
Admission: EM | Admit: 2016-06-05 | Discharge: 2016-06-06 | Disposition: A | Discharge status: Home / Self Care | Payer: 59 | Attending: Emergency Medicine | Admitting: Emergency Medicine

## 2016-06-05 ENCOUNTER — Emergency Department (HOSPITAL_BASED_OUTPATIENT_CLINIC_OR_DEPARTMENT_OTHER): Payer: 59

## 2016-06-05 ENCOUNTER — Encounter (HOSPITAL_BASED_OUTPATIENT_CLINIC_OR_DEPARTMENT_OTHER): Payer: Self-pay | Admitting: Emergency Medicine

## 2016-06-05 DIAGNOSIS — M549 Dorsalgia, unspecified: Secondary | ICD-10-CM | POA: Diagnosis present

## 2016-06-05 DIAGNOSIS — Z79899 Other long term (current) drug therapy: Secondary | ICD-10-CM | POA: Insufficient documentation

## 2016-06-05 DIAGNOSIS — M546 Pain in thoracic spine: Secondary | ICD-10-CM

## 2016-06-05 DIAGNOSIS — S29011A Strain of muscle and tendon of front wall of thorax, initial encounter: Secondary | ICD-10-CM | POA: Diagnosis not present

## 2016-06-05 DIAGNOSIS — F17210 Nicotine dependence, cigarettes, uncomplicated: Secondary | ICD-10-CM | POA: Diagnosis not present

## 2016-06-05 MED ORDER — HYDROCODONE-ACETAMINOPHEN 5-325 MG PO TABS
1.0000 | ORAL_TABLET | Freq: Once | ORAL | Status: AC
  Administered 2016-06-05: 1 via ORAL
  Filled 2016-06-05: qty 1

## 2016-06-05 MED ORDER — NAPROXEN 250 MG PO TABS
500.0000 mg | ORAL_TABLET | Freq: Once | ORAL | Status: AC
  Administered 2016-06-05: 500 mg via ORAL
  Filled 2016-06-05: qty 2

## 2016-06-05 NOTE — ED Provider Notes (Signed)
.   [facility] Provider Note: Lowella Dell, MD, FACEP  CSN: [id number] MRN: [id number] ARRIVAL: 06/05/16 at 1938 ROOM: [facility]  By signing my name below, I, Modena Jansky, attest that this documentation has been prepared under the direction and in the presence of Paula Libra, MD. Electronically Signed: Modena Jansky, Scribe. 06/05/2016. 11:05 PM.  CHIEF COMPLAINT  Back Pain   HISTORY OF PRESENT ILLNESS  Albert Hall is a 31 y.o. male who presents to the Emergency Department complaining of constant soderate to severe left infrascapular back pain that started about 6 hours ago. He had a sudden onset of sharp pain while leaning over at work this afternoon about 5 PM. He was seen by a chiropractor earlier this evening who adjusted his lower back but told him he needed to have his injury evaluated in the ED. His pain is exacerbated by certain movements. He denies any numbness or tingling.   Past Medical History:  Diagnosis Date  . Anxiety   . Generalized anxiety disorder 01/08/2014  . GERD (gastroesophageal reflux disease)   . Insomnia 01/08/2014  . Male sexual dysfunction   . Tobacco abuse 02/11/2014    History reviewed. No pertinent surgical history.  Family History  Problem Relation Age of Onset  . Asthma Father   . Asthma Sister   . Diabetes Paternal Uncle   . Cancer Neg Hx   . Stroke Neg Hx   . CAD Neg Hx     Social History  Substance Use Topics  . Smoking status: Current Every Day Smoker    Packs/day: 0.25    Years: 3.00    Types: Cigarettes  . Smokeless tobacco: Former Neurosurgeon  . Alcohol use 4.2 - 4.8 oz/week    7 Cans of beer per week     Comment: 1 beer nightly    Prior to Admission medications   Medication Sig Start Date End Date Taking? Authorizing Provider  omeprazole (PRILOSEC) 20 MG capsule Take 1 capsule (20 mg total) by mouth 2 (two) times daily before a meal. 10/17/15  Yes Eustaquio Boyden, MD  Vitamin D, Ergocalciferol, (DRISDOL) 50000  units CAPS capsule Take 1 capsule (50,000 Units total) by mouth every 7 (seven) days. 12/08/15  Yes Deborah Opalski, DO  escitalopram (LEXAPRO) 10 MG tablet Take 1 tablet (10 mg total) by mouth daily. 12/08/15   Deborah Opalski, DO  LORazepam (ATIVAN) 1 MG tablet Take 1 tablet (1 mg total) by mouth 2 (two) times daily as needed. Patient not taking: Reported on 12/08/2015 09/16/15   Eustaquio Boyden, MD  Melatonin 3-10 MG TABS Take 3 mg by mouth at bedtime. 2 tbs nightly    Historical Provider, MD  Vitamin D, Cholecalciferol, 1000 units CAPS Take 2 capsules by mouth daily.    Historical Provider, MD    Allergies Penicillins and Trazodone and nefazodone   REVIEW OF SYSTEMS  Negative except as noted here or in the History of Present Illness.   PHYSICAL EXAMINATION  Initial Vital Signs Blood pressure (!) 152/104, pulse 109, temperature 98 F (36.7 C), temperature source Oral, resp. rate 20, height 6' (1.829 m), weight 252 lb (114.3 kg), SpO2 99 %.  Examination General: Well-developed, well-nourished male in no acute distress; appearance consistent with age of record HENT: normocephalic; atraumatic Eyes: pupils equal, round and reactive to light; extraocular muscles intact Neck: supple Heart: regular rate and rhythm Lungs: clear to auscultation bilaterally Abdomen: soft; nondistended; nontender; no masses or hepatosplenomegaly; bowel sounds present Back: No  tenderness of the back, but pain is reproduced with back movement and certain movements of the LUE Extremities: No deformity; full range of motion; pulses normal Neurologic: Awake, alert and oriented; motor function intact in all extremities and symmetric; no facial droop Skin: Warm and dry Psychiatric: Normal mood and affect   RESULTS  Summary of this visit's results, reviewed by myself:   EKG Interpretation  Date/Time:    Ventricular Rate:    PR Interval:    QRS Duration:   QT Interval:    QTC Calculation:   R Axis:       Text Interpretation:        Laboratory Studies: No results found for this or any previous visit (from the past 24 hour(s)). Imaging Studies: Dg Ribs Unilateral W/chest Left  Result Date: 06/06/2016 CLINICAL DATA:  Strain the left anterior ribs, pain with deep inspiration EXAM: LEFT RIBS AND CHEST - 3+ VIEW COMPARISON:  None. FINDINGS: Single-view chest demonstrates no acute infiltrate or effusion. Normal heart size. No pneumothorax. Left rib series demonstrates no acute displaced left rib fracture. IMPRESSION: 1. Single-view chest within normal limits 2. No acute displaced left rib fracture Electronically Signed   By: Jasmine PangKim  Fujinaga M.D.   On: 06/06/2016 00:11    ED COURSE  Nursing notes and initial vitals signs, including pulse oximetry, reviewed.  Vitals:   06/05/16 1945 06/05/16 2341  BP: (!) 152/104 141/96  Pulse: 109 80  Resp: 20 20  Temp: 98 F (36.7 C) 98.8 F (37.1 C)  TempSrc: Oral Oral  SpO2: 99% 98%  Weight: 252 lb (114.3 kg)   Height: 6' (1.829 m)    1:04 AM Pain significantly improved after hydrocodone and naproxen. No evidence of rib fracture on radiographs.  PROCEDURES    ED DIAGNOSES  No diagnosis found.  I personally performed the services described in this documentation, which was scribed in my presence. The recorded information has been reviewed and is accurate.     Paula LibraJohn Tayden Duran, MD 06/06/16 [phone]

## 2016-06-05 NOTE — ED Triage Notes (Addendum)
Mid back pain that occurred when bending this afternoon. . Was seen by chiropractor today and told to come to ED . Has not used any OTC meds for pain

## 2016-06-05 NOTE — ED Notes (Signed)
Pt states he was bent over sink today at work washing his hands when he felt a pop and pull under his left shoulder blade. PT has full ROM but pain when extending left arm. Pt states he went to chiropractor but was told to come to ED.

## 2016-06-06 MED ORDER — HYDROCODONE-ACETAMINOPHEN 5-325 MG PO TABS: 1.0000 | ORAL_TABLET | ORAL | 0 refills | Status: DC | PRN

## 2016-06-06 NOTE — ED Notes (Signed)
Pt discharged to home with family. NAD.  

## 2016-08-02 ENCOUNTER — Ambulatory Visit (INDEPENDENT_AMBULATORY_CARE_PROVIDER_SITE_OTHER): Payer: 59 | Admitting: Family Medicine

## 2016-08-02 ENCOUNTER — Encounter: Payer: Self-pay | Admitting: Family Medicine

## 2016-08-02 VITALS — BP 144/104 | HR 72 | Ht 72.5 in | Wt 246.0 lb

## 2016-08-02 DIAGNOSIS — I10 Essential (primary) hypertension: Secondary | ICD-10-CM

## 2016-08-02 DIAGNOSIS — R400 Somnolence: Secondary | ICD-10-CM | POA: Diagnosis not present

## 2016-08-02 DIAGNOSIS — Z720 Tobacco use: Secondary | ICD-10-CM | POA: Diagnosis not present

## 2016-08-02 DIAGNOSIS — K219 Gastro-esophageal reflux disease without esophagitis: Secondary | ICD-10-CM | POA: Diagnosis not present

## 2016-08-02 DIAGNOSIS — R0683 Snoring: Secondary | ICD-10-CM | POA: Diagnosis not present

## 2016-08-02 DIAGNOSIS — Z6832 Body mass index (BMI) 32.0-32.9, adult: Secondary | ICD-10-CM

## 2016-08-02 DIAGNOSIS — R5383 Other fatigue: Secondary | ICD-10-CM

## 2016-08-02 DIAGNOSIS — E669 Obesity, unspecified: Secondary | ICD-10-CM | POA: Diagnosis not present

## 2016-08-02 DIAGNOSIS — F411 Generalized anxiety disorder: Secondary | ICD-10-CM

## 2016-08-02 MED ORDER — LOSARTAN POTASSIUM-HCTZ 100-25 MG PO TABS: 0.5000 | ORAL_TABLET | Freq: Every day | ORAL | 0 refills | Status: DC

## 2016-08-02 NOTE — Patient Instructions (Addendum)
GERD handouts given to pt on lifestyle changes.  Lifestyle changes such as dash diet and engaging in a regular exercise program discussed with patient.  Educational handouts provided  Ambulatory BP monitoring encouraged. Keep log and bring in next OV    Sleep Apnea Sleep apnea is a condition in which breathing pauses or becomes shallow during sleep. Episodes of sleep apnea usually last 10 seconds or longer, and they may occur as many as 20 times an hour. Sleep apnea disrupts your sleep and keeps your body from getting the rest that it needs. This condition can increase your risk of certain health problems, including:  Heart attack.  Stroke.  Obesity.  Diabetes.  Heart failure.  Irregular heartbeat. There are three kinds of sleep apnea:  Obstructive sleep apnea. This kind is caused by a blocked or collapsed airway.  Central sleep apnea. This kind happens when the part of the brain that controls breathing does not send the correct signals to the muscles that control breathing.  Mixed sleep apnea. This is a combination of obstructive and central sleep apnea. What are the causes? The most common cause of this condition is a collapsed or blocked airway. An airway can collapse or become blocked if:  Your throat muscles are abnormally relaxed.  Your tongue and tonsils are larger than normal.  You are overweight.  Your airway is smaller than normal. What increases the risk? This condition is more likely to develop in people who:  Are overweight.  Smoke.  Have a smaller than normal airway.  Are elderly.  Are male.  Drink alcohol.  Take sedatives or tranquilizers.  Have a family history of sleep apnea. What are the signs or symptoms? Symptoms of this condition include:  Trouble staying asleep.  Daytime sleepiness and tiredness.  Irritability.  Loud snoring.  Morning headaches.  Trouble concentrating.  Forgetfulness.  Decreased interest in  sex.  Unexplained sleepiness.  Mood swings.  Personality changes.  Feelings of depression.  Waking up often during the night to urinate.  Dry mouth.  Sore throat. How is this diagnosed? This condition may be diagnosed with:  A medical history.  A physical exam.  A series of tests that are done while you are sleeping (sleep study). These tests are usually done in a sleep lab, but they may also be done at home. How is this treated? Treatment for this condition aims to restore normal breathing and to ease symptoms during sleep. It may involve managing health issues that can affect breathing, such as high blood pressure or obesity. Treatment may include:  Sleeping on your side.  Using a decongestant if you have nasal congestion.  Avoiding the use of depressants, including alcohol, sedatives, and narcotics.  Losing weight if you are overweight.  Making changes to your diet.  Quitting smoking.  Using a device to open your airway while you sleep, such as:  An oral appliance. This is a custom-made mouthpiece that shifts your lower jaw forward.  A continuous positive airway pressure (CPAP) device. This device delivers oxygen to your airway through a mask.  A nasal expiratory positive airway pressure (EPAP) device. This device has valves that you put into each nostril.  A bi-level positive airway pressure (BPAP) device. This device delivers oxygen to your airway through a mask.  Surgery if other treatments do not work. During surgery, excess tissue is removed to create a wider airway. It is important to get treatment for sleep apnea. Without treatment, this condition can lead to:  High blood pressure.  Coronary artery disease.  (Men) An inability to achieve or maintain an erection (impotence).  Reduced thinking abilities. Follow these instructions at home:  Make any lifestyle changes that your health care provider recommends.  Eat a healthy, well-balanced  diet.  Take over-the-counter and prescription medicines only as told by your health care provider.  Avoid using depressants, including alcohol, sedatives, and narcotics.  Take steps to lose weight if you are overweight.  If you were given a device to open your airway while you sleep, use it only as told by your health care provider.  Do not use any tobacco products, such as cigarettes, chewing tobacco, and e-cigarettes. If you need help quitting, ask your health care provider.  Keep all follow-up visits as told by your health care provider. This is important. Contact a health care provider if:  The device that you received to open your airway during sleep is uncomfortable or does not seem to be working.  Your symptoms do not improve.  Your symptoms get worse. Get help right away if:  You develop chest pain.  You develop shortness of breath.  You develop discomfort in your back, arms, or stomach.  You have trouble speaking.  You have weakness on one side of your body.  You have drooping in your face. These symptoms may represent a serious problem that is an emergency. Do not wait to see if the symptoms will go away. Get medical help right away. Call your local emergency services (911 in the [place] Do not drive yourself to the hospital. This information is not intended to replace advice given to you by your health care provider. Make sure you discuss any questions you have with your health care provider. Document Released: 03/19/2002 Document Revised: 11/23/2015 Document Reviewed: 01/06/2015 Elsevier Interactive Patient Education  2017 [employer]   DASH Eating Plan DASH stands for "Dietary Approaches to Stop Hypertension." The DASH eating plan is a healthy eating plan that has been shown to reduce high blood pressure (hypertension). It may also reduce your risk for type 2 diabetes, heart disease, and stroke. The DASH eating plan may also help with weight loss. What are tips  for following this plan? General guidelines   Avoid eating more than 2,300 mg (milligrams) of salt (sodium) a day. If you have hypertension, you may need to reduce your sodium intake to 1,500 mg a day.  Limit alcohol intake to no more than 1 drink a day for nonpregnant women and 2 drinks a day for men. One drink equals 12 oz of beer, 5 oz of wine, or 1 oz of hard liquor.  Work with your health care provider to maintain a healthy body weight or to lose weight. Ask what an ideal weight is for you.  Get at least 30 minutes of exercise that causes your heart to beat faster (aerobic exercise) most days of the week. Activities may include walking, swimming, or biking.  Work with your health care provider or diet and nutrition specialist (dietitian) to adjust your eating plan to your individual calorie needs. Reading food labels   Check food labels for the amount of sodium per serving. Choose foods with less than 5 percent of the Daily Value of sodium. Generally, foods with less than 300 mg of sodium per serving fit into this eating plan.  To find whole grains, look for the word "whole" as the first word in the ingredient list. Shopping   Buy products labeled as "low-sodium" or "  no salt added."  Buy fresh foods. Avoid canned foods and premade or frozen meals. Cooking   Avoid adding salt when cooking. Use salt-free seasonings or herbs instead of table salt or sea salt. Check with your health care provider or pharmacist before using salt substitutes.  Do not fry foods. Cook foods using healthy methods such as baking, boiling, grilling, and broiling instead.  Cook with heart-healthy oils, such as olive, canola, soybean, or sunflower oil. Meal planning    Eat a balanced diet that includes:  5 or more servings of fruits and vegetables each day. At each meal, try to fill half of your plate with fruits and vegetables.  Up to 6-8 servings of whole grains each day.  Less than 6 oz of lean  meat, poultry, or fish each day. A 3-oz serving of meat is about the same size as a deck of cards. One egg equals 1 oz.  2 servings of low-fat dairy each day.  A serving of nuts, seeds, or beans 5 times each week.  Heart-healthy fats. Healthy fats called Omega-3 fatty acids are found in foods such as flaxseeds and coldwater fish, like sardines, salmon, and mackerel.  Limit how much you eat of the following:  Canned or prepackaged foods.  Food that is high in trans fat, such as fried foods.  Food that is high in saturated fat, such as fatty meat.  Sweets, desserts, sugary drinks, and other foods with added sugar.  Full-fat dairy products.  Do not salt foods before eating.  Try to eat at least 2 vegetarian meals each week.  Eat more home-cooked food and less restaurant, buffet, and fast food.  When eating at a restaurant, ask that your food be prepared with less salt or no salt, if possible. What foods are recommended? The items listed may not be a complete list. Talk with your dietitian about what dietary choices are best for you. Grains  Whole-grain or whole-wheat bread. Whole-grain or whole-wheat pasta. Brown rice. Orpah Cobb. Bulgur. Whole-grain and low-sodium cereals. Pita bread. Low-fat, low-sodium crackers. Whole-wheat flour tortillas. Vegetables  Fresh or frozen vegetables (raw, steamed, roasted, or grilled). Low-sodium or reduced-sodium tomato and vegetable juice. Low-sodium or reduced-sodium tomato sauce and tomato paste. Low-sodium or reduced-sodium canned vegetables. Fruits  All fresh, dried, or frozen fruit. Canned fruit in natural juice (without added sugar). Meat and other protein foods  Skinless chicken or [place]. Ground chicken or [place]. Pork with fat trimmed off. Fish and seafood. Egg whites. Dried beans, peas, or lentils. Unsalted nuts, nut butters, and seeds. Unsalted canned beans. Lean cuts of beef with fat trimmed off. Low-sodium, lean deli meat. Dairy   Low-fat (1%) or fat-free (skim) milk. Fat-free, low-fat, or reduced-fat cheeses. Nonfat, low-sodium ricotta or cottage cheese. Low-fat or nonfat yogurt. Low-fat, low-sodium cheese. Fats and oils  Soft margarine without trans fats. Vegetable oil. Low-fat, reduced-fat, or light mayonnaise and salad dressings (reduced-sodium). Canola, safflower, olive, soybean, and sunflower oils. Avocado. Seasoning and other foods  Herbs. Spices. Seasoning mixes without salt. Unsalted popcorn and pretzels. Fat-free sweets. What foods are not recommended? The items listed may not be a complete list. Talk with your dietitian about what dietary choices are best for you. Grains  Baked goods made with fat, such as croissants, muffins, or some breads. Dry pasta or rice meal packs. Vegetables  Creamed or fried vegetables. Vegetables in a cheese sauce. Regular canned vegetables (not low-sodium or reduced-sodium). Regular canned tomato sauce and paste (not low-sodium or reduced-sodium).  Regular tomato and vegetable juice (not low-sodium or reduced-sodium). Rosita Fire. Olives. Fruits  Canned fruit in a light or heavy syrup. Fried fruit. Fruit in cream or butter sauce. Meat and other protein foods  Fatty cuts of meat. Ribs. Fried meat. Tomasa Blase. Sausage. Bologna and other processed lunch meats. Salami. Fatback. Hotdogs. Bratwurst. Salted nuts and seeds. Canned beans with added salt. Canned or smoked fish. Whole eggs or egg yolks. Chicken or [place] with skin. Dairy  Whole or 2% milk, cream, and half-and-half. Whole or full-fat cream cheese. Whole-fat or sweetened yogurt. Full-fat cheese. Nondairy creamers. Whipped toppings. Processed cheese and cheese spreads. Fats and oils  Butter. Stick margarine. Lard. Shortening. Ghee. Bacon fat. Tropical oils, such as coconut, palm kernel, or palm oil. Seasoning and other foods  Salted popcorn and pretzels. Onion salt, garlic salt, seasoned salt, table salt, and sea salt. Worcestershire  sauce. Tartar sauce. Barbecue sauce. Teriyaki sauce. Soy sauce, including reduced-sodium. Steak sauce. Canned and packaged gravies. Fish sauce. Oyster sauce. Cocktail sauce. Horseradish that you find on the shelf. Ketchup. Mustard. Meat flavorings and tenderizers. Bouillon cubes. Hot sauce and Tabasco sauce. Premade or packaged marinades. Premade or packaged taco seasonings. Relishes. Regular salad dressings. Where to find more information:  National Heart, Lung, and Blood Institute: PopSteam.is  American Heart Association: www.heart.org Summary  The DASH eating plan is a healthy eating plan that has been shown to reduce high blood pressure (hypertension). It may also reduce your risk for type 2 diabetes, heart disease, and stroke.  With the DASH eating plan, you should limit salt (sodium) intake to 2,300 mg a day. If you have hypertension, you may need to reduce your sodium intake to 1,500 mg a day.  When on the DASH eating plan, aim to eat more fresh fruits and vegetables, whole grains, lean proteins, low-fat dairy, and heart-healthy fats.  Work with your health care provider or diet and nutrition specialist (dietitian) to adjust your eating plan to your individual calorie needs. This information is not intended to replace advice given to you by your health care provider. Make sure you discuss any questions you have with your health care provider. Document Released: 03/18/2011 Document Revised: 03/22/2016 Document Reviewed: 03/22/2016 Elsevier Interactive Patient Education  2017 [employer].

## 2016-08-02 NOTE — Assessment & Plan Note (Signed)
Discussed with patient treatment options including over-the-counter medications and prescription ones.  In addition, discussed with patient lifestyle modification including not eating within 2 hours of lying down, avoiding trigger foods, decrease caffeine intake, decrease abdominal girth (if applicable), no tobacco cessation (if applicable).    

## 2016-08-02 NOTE — Assessment & Plan Note (Signed)
Counseling done regarding disease process and various treatment options.  All questions answered  Handouts given if patient desired them   

## 2016-08-02 NOTE — Assessment & Plan Note (Signed)
Pt stopped his lexapro on his own many mo ago.  No Sx now- well controlled.   - advised healthy habits

## 2016-08-02 NOTE — Assessment & Plan Note (Signed)
rec wt loss to help with OSA sx, HTN, fatigue etc  American Heart Association guidelines for healthy diet, basically Mediterranean diet, and exercise guidelines of 30 minutes 5 days per week or more discussed in detail.  Health counseling performed.  All questions answered.

## 2016-08-02 NOTE — Assessment & Plan Note (Signed)
-   Stop bang score of 6 which may be contributing to pt HTN  - referral placed to Sleep Med for eval after d/c pt

## 2016-08-02 NOTE — Progress Notes (Signed)
Impression and Recommendations:    1. Essential hypertension   2. Snoring   3. Tobacco abuse   4. Class 1 obesity with body mass index (BMI) of 32.0 to 32.9 in adult, unspecified obesity type, unspecified whether serious comorbidity present   5. Fatigue, unspecified type   6. Gastroesophageal reflux disease, esophagitis presence not specified   7. fatigue and daytime Somnolence   8. Generalized anxiety disorder/ PTSD      fatigue and daytime Somnolence - Stop bang score of 6 which may be contributing to pt HTN  - referral placed to Sleep Med for eval after d/c pt    Generalized anxiety disorder/ PTSD Pt stopped his lexapro on his own many mo ago.  No Sx now- well controlled.   - advised healthy habits  BMI >er 30 rec wt loss to help with OSA sx, HTN, fatigue etc  American Heart Association guidelines for healthy diet, basically Mediterranean diet, and exercise guidelines of 30 minutes 5 days per week or more discussed in detail.  Health counseling performed.  All questions answered.  Tobacco abuse Counseling done regarding disease process and various treatment options.  All questions answered  Handouts given if patient desired them    GERD (gastroesophageal reflux disease) Discussed with patient treatment options including over-the-counter medications and prescription ones.  In addition, discussed with patient lifestyle modification including not eating within 2 hours of lying down, avoiding trigger foods, decrease caffeine intake, decrease abdominal girth (if applicable), no tobacco cessation (if applicable).   The patient was counseled, risk factors were discussed, anticipatory guidance given.   New Prescriptions   LOSARTAN-HYDROCHLOROTHIAZIDE (HYZAAR) 100-25 MG TABLET    Take 0.5 tablets by mouth daily.     Meds ordered this encounter  Medications  . losartan-hydrochlorothiazide (HYZAAR) 100-25 MG tablet    Sig: Take 0.5 tablets by mouth daily.      Dispense:  90 tablet    Refill:  0     Orders Placed This Encounter  Procedures  . Ambulatory referral to Sleep Studies     Gross side effects, risk and benefits, and alternatives of medications and treatment plan in general discussed with patient.  Patient is aware that all medications have potential side effects and we are unable to predict every side effect or drug-drug interaction that may occur.   Patient will call with any questions prior to using medication if they have concerns.  Expresses verbal understanding and consents to current therapy and treatment regimen.  No barriers to understanding were identified.  Red flag symptoms and signs discussed in detail.  Patient expressed understanding regarding what to do in case of emergency\urgent symptoms  Please see AVS handed out to patient at the end of our visit for further patient instructions/ counseling done pertaining to today's office visit.   Return in about 4 weeks (around 08/30/2016) for sleep study results and f/up new BP med- bring log.     Note: This document was prepared using Dragon voice recognition software and may include unintentional dictation errors.   --------------------------------------------------------------------------------------------------------------------------------------------------------------------------------------------------------------------------------------------    Subjective:    CC:  Chief Complaint  Patient presents with  . Anxiety    HPI: Albert Hall is a 31 y.o. male who presents to [facility] today for issues as discussed below.   Hasn't smoked in a day and a half.  Too busy with work. Not interested in quitting per se  BP- at home  not checking but 1-2 mo ago- was borderline whenever he checked it  Anxiety-  Off his lexapro we started him on back in Summertime./  Doing well  Sleeping well- uses sleep meditation at times and no longer  needing melatonin.    ? OSA SX:   Loud snoring constantly, awakes feeling choked at times/ gasping for air per wife, daytime fatigue/ somnolence, occ AM HA's, HTN, obesity  -  STOP BANG: Snore:     Y Tired:     Y Observed stop breathing:  Y Hypertension:   Y  BMI >35:   N Age >50:   N Neck > 16 inches:  Y Male gender:   Y ------------------------------------------ Total:     SCORE 6   Wt Readings from Last 3 Encounters:  08/02/16 246 lb (111.6 kg)  06/05/16 252 lb (114.3 kg)  12/08/15 241 lb 1.6 oz (109.4 kg)   BP Readings from Last 3 Encounters:  08/02/16 (!) 144/104  06/06/16 132/74  12/08/15 134/89   Pulse Readings from Last 3 Encounters:  08/02/16 72  06/06/16 89  12/08/15 89   BMI Readings from Last 3 Encounters:  08/02/16 32.90 kg/m  06/05/16 34.18 kg/m  12/08/15 32.25 kg/m     Patient Care Team    Relationship Specialty Notifications Start End  Thomasene Lot, DO PCP - General Family Medicine  11/10/15      Patient Active Problem List   Diagnosis Date Noted  . BMI >er 30 11/15/2015    Priority: High  . Tobacco abuse 02/11/2014    Priority: High  . GERD (gastroesophageal reflux disease)     Priority: Medium  . Generalized anxiety disorder/ PTSD 01/08/2014    Priority: Medium  . Vitamin D deficiency 12/08/2015    Priority: Low  . fatigue and daytime Somnolence 12/03/2015    Priority: Low  . mild Fatigue 11/11/2015  . Stressful life event affecting family 09/16/2015  . H/o Insomnia 01/08/2014    Past Medical history, Surgical history, Family history, Social history, Allergies and Medications have been entered into the medical record, reviewed and changed as needed.    Current Meds  Medication Sig  . omeprazole (PRILOSEC) 20 MG capsule Take 1 capsule (20 mg total) by mouth 2 (two) times daily before a meal.  . Vitamin D, Cholecalciferol, 1000 units CAPS Take 2 capsules by mouth daily.  . Vitamin D, Ergocalciferol, (DRISDOL) 50000 units  CAPS capsule Take 1 capsule (50,000 Units total) by mouth every 7 (seven) days.    Allergies:  Allergies  Allergen Reactions  . Penicillins Anaphylaxis  . Trazodone And Nefazodone Other (See Comments)    Affected hearing, malaise     Review of Systems: General:   Denies fever, chills, unexplained weight loss.  Optho/Auditory:   Denies visual changes, blurred vision/LOV Respiratory:   Denies wheeze, DOE more than baseline levels.  Cardiovascular:   Denies chest pain, palpitations, new onset peripheral edema  Gastrointestinal:   Denies nausea, vomiting, diarrhea, abd pain.  Genitourinary: Denies dysuria, freq/ urgency.  Endocrine:     Denies hot or cold intolerance, polyuria, polydipsia. Musculoskeletal:   Denies unexplained myalgias, joint swelling, unexplained arthralgias, gait problems.  Skin:  Denies new onset rash Neurological:     Denies dizziness, unexplained weakness, numbness  Psychiatric/Behavioral:   Denies mood changes, suicidal or homicidal ideations, hallucinations    Objective:   Blood pressure (!) 144/104, pulse 72, height 6' 0.5" (1.842 m), weight 246 lb (111.6 kg). Body mass index is  32.9 kg/m. General:  Well Developed, well nourished, appropriate for stated age.  Neuro:  Alert and oriented,  extra-ocular muscles intact  HEENT:  Normocephalic, atraumatic, neck supple, no carotid bruits appreciated  Skin:  no gross rash, warm, pink. Cardiac:  RRR, S1 S2 Respiratory:  ECTA B/L and A/P, Not using accessory muscles, speaking in full sentences- unlabored. Vascular:  Ext warm, no cyanosis apprec.; cap RF less 2 sec. Psych:  No HI/SI, judgement and insight good, Euthymic mood. Full Affect.

## 2016-08-30 ENCOUNTER — Ambulatory Visit: Payer: Self-pay | Admitting: Family Medicine

## 2016-10-20 ENCOUNTER — Other Ambulatory Visit: Payer: Self-pay | Admitting: Family Medicine

## 2016-10-23 NOTE — Telephone Encounter (Signed)
No, this pt needs an OV.  Please see me last note===>  Needs to f/up with me

## 2017-01-27 DIAGNOSIS — H40033 Anatomical narrow angle, bilateral: Secondary | ICD-10-CM | POA: Diagnosis not present

## 2017-01-27 DIAGNOSIS — H04123 Dry eye syndrome of bilateral lacrimal glands: Secondary | ICD-10-CM | POA: Diagnosis not present

## 2017-07-10 DIAGNOSIS — J398 Other specified diseases of upper respiratory tract: Secondary | ICD-10-CM | POA: Diagnosis not present

## 2017-08-05 DIAGNOSIS — L237 Allergic contact dermatitis due to plants, except food: Secondary | ICD-10-CM | POA: Diagnosis not present

## 2017-08-11 ENCOUNTER — Telehealth: Payer: Self-pay | Admitting: Family Medicine

## 2017-08-11 NOTE — Telephone Encounter (Signed)
Patient called wanting to discuss getting something to help him during a difficult time. His father committed suicide today and he is having a hard time dealing with it. Please contact patient and advise anything we can do for him.

## 2017-08-11 NOTE — Telephone Encounter (Signed)
Called patient, got VM left message for patient to call back in the morning. MPulliam, CMA/RT(R)

## 2017-08-12 NOTE — Telephone Encounter (Signed)
Spoke to patient, he states that his wife is the one that was worried.  Patient denies any symptoms, just states that he is not sleeping.  Offered patient an appointment for Monday morning, patient states that he does not feel like he needs to see anyone at this time and will follow up as needed. MPulliam, CMA/RT(R)

## 2017-08-30 ENCOUNTER — Ambulatory Visit (INDEPENDENT_AMBULATORY_CARE_PROVIDER_SITE_OTHER): Payer: 59 | Admitting: Psychology

## 2017-08-30 ENCOUNTER — Encounter (INDEPENDENT_AMBULATORY_CARE_PROVIDER_SITE_OTHER): Payer: Self-pay

## 2017-08-30 DIAGNOSIS — F4310 Post-traumatic stress disorder, unspecified: Secondary | ICD-10-CM | POA: Diagnosis not present

## 2017-09-08 ENCOUNTER — Ambulatory Visit: Payer: 59 | Admitting: Psychology

## 2017-09-28 ENCOUNTER — Ambulatory Visit (INDEPENDENT_AMBULATORY_CARE_PROVIDER_SITE_OTHER): Payer: 59 | Admitting: Psychology

## 2017-09-28 DIAGNOSIS — F410 Panic disorder [episodic paroxysmal anxiety] without agoraphobia: Secondary | ICD-10-CM

## 2017-10-10 ENCOUNTER — Ambulatory Visit: Payer: 59 | Admitting: Family Medicine

## 2017-10-10 ENCOUNTER — Encounter: Payer: Self-pay | Admitting: Family Medicine

## 2017-10-10 VITALS — BP 146/107 | HR 106 | Ht 73.0 in | Wt 254.9 lb

## 2017-10-10 DIAGNOSIS — F411 Generalized anxiety disorder: Secondary | ICD-10-CM

## 2017-10-10 DIAGNOSIS — G4700 Insomnia, unspecified: Secondary | ICD-10-CM

## 2017-10-10 DIAGNOSIS — R030 Elevated blood-pressure reading, without diagnosis of hypertension: Secondary | ICD-10-CM

## 2017-10-10 DIAGNOSIS — F4312 Post-traumatic stress disorder, chronic: Secondary | ICD-10-CM | POA: Diagnosis not present

## 2017-10-10 DIAGNOSIS — F430 Acute stress reaction: Secondary | ICD-10-CM

## 2017-10-10 DIAGNOSIS — R0789 Other chest pain: Secondary | ICD-10-CM | POA: Insufficient documentation

## 2017-10-10 DIAGNOSIS — R400 Somnolence: Secondary | ICD-10-CM

## 2017-10-10 DIAGNOSIS — F5102 Adjustment insomnia: Secondary | ICD-10-CM

## 2017-10-10 DIAGNOSIS — F4310 Post-traumatic stress disorder, unspecified: Secondary | ICD-10-CM | POA: Insufficient documentation

## 2017-10-10 DIAGNOSIS — K219 Gastro-esophageal reflux disease without esophagitis: Secondary | ICD-10-CM

## 2017-10-10 DIAGNOSIS — F1010 Alcohol abuse, uncomplicated: Secondary | ICD-10-CM

## 2017-10-10 MED ORDER — OMEPRAZOLE 20 MG PO CPDR: DELAYED_RELEASE_CAPSULE | ORAL | 1 refills | Status: DC

## 2017-10-10 MED ORDER — SERTRALINE HCL 50 MG PO TABS: ORAL_TABLET | ORAL | 2 refills | Status: DC

## 2017-10-10 MED ORDER — BUSPIRONE HCL 10 MG PO TABS: 10.0000 mg | ORAL_TABLET | Freq: Three times a day (TID) | ORAL | 5 refills | Status: DC

## 2017-10-10 MED ORDER — RANITIDINE HCL 300 MG PO TABS: 300.0000 mg | ORAL_TABLET | Freq: Two times a day (BID) | ORAL | 3 refills | Status: DC

## 2017-10-10 MED ORDER — AMITRIPTYLINE HCL 25 MG PO TABS: 50.0000 mg | ORAL_TABLET | Freq: Every day | ORAL | 1 refills | Status: DC

## 2017-10-10 NOTE — Patient Instructions (Addendum)
Please start the Elavil 1 to 2 tablets as needed at night-this is amitriptyline.  This will help his sleep and will help with pain and depression\anxiety as well.  Also please start the Zoloft\sertraline.  Please start with a half a tablet then every week increase dose as tolerated as per on the medication bottle  Also please start the buspirone 1/2 tablet 3 times daily for 1-2 weeks then go to 1 tablet 3 times daily.  We will obtain blood work and will follow you up closely in 8-10 weeks or sooner if needed  Please try to refrain from heavy alcohol usage as this will only make you mentally feel much worse.  Try to exercise to a goal of 30 minutes daily and eat healthy as this will help with your stress levels.     I want you to do Cooks Hook-up and square breathing 4-5 times per day.  10 square breathing each time.    -Also we need to transition your brain into thinking more positively.  These tasks below are some things I want you to do every day 1)  write 3 new things that you are grateful for every day for 21 days  2)  exercise daily- walk for 15 minutes twice a day every day 3)  you are going to journal every day about one positive experience that you had 4)  meditate every day.  You can go on YouTube and look for 15-minute relaxation meditation or what ever.  But we need to make sure that you are in the moment and relaxing and deep breathing every day 5)  Write 1 positive email every day to praise someone in your life    - If you have insomnia or difficulty sleeping, this information is for you:  - Avoid caffeinated beverages after lunch,  no alcoholic beverages,  no eating within 2-3 hours of lying down,  avoid exposure to blue light before bed,  avoid daytime naps, and  needs to maintain a regular sleep schedule- go to sleep and wake up around the same time every night.   - Resolve concerns or worries before entering bedroom:  Discussed relaxation techniques with patient  and to keep a journal to write down fears\ worries.  I suggested seeing a counselor for CBT.   - Recommend patient meditate or do deep breathing exercises to help relax.   Incorporate the use of white noise machines or listen to "sleep meditation music", or recordings of guided meditations for sleep from YouTube which are free, such as  "guided meditation for detachment from over thinking"  by Ina KickMichael Sealey.
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
## 2017-10-11 LAB — HEMOGLOBIN A1C
ESTIMATED AVERAGE GLUCOSE: 94 mg/dL
Hgb A1c MFr Bld: 4.9 % (ref 4.8–5.6)

## 2017-10-11 LAB — COMPREHENSIVE METABOLIC PANEL
ALBUMIN: 4.3 g/dL (ref 3.5–5.5)
ALT: 83 IU/L — ABNORMAL HIGH (ref 0–44)
AST: 31 IU/L (ref 0–40)
Albumin/Globulin Ratio: 1.5 (ref 1.2–2.2)
Alkaline Phosphatase: 120 IU/L — ABNORMAL HIGH (ref 39–117)
BUN / CREAT RATIO: 15 (ref 9–20)
BUN: 12 mg/dL (ref 6–20)
Bilirubin Total: 0.4 mg/dL (ref 0.0–1.2)
CALCIUM: 9.4 mg/dL (ref 8.7–10.2)
CO2: 26 mmol/L (ref 20–29)
CREATININE: 0.81 mg/dL (ref 0.76–1.27)
Chloride: 104 mmol/L (ref 96–106)
GFR, EST AFRICAN AMERICAN: 137 mL/min/{1.73_m2} (ref >59)
GFR, EST NON AFRICAN AMERICAN: 118 mL/min/{1.73_m2} (ref >59)
GLOBULIN, TOTAL: 2.8 g/dL (ref 1.5–4.5)
Glucose: 106 mg/dL — ABNORMAL HIGH (ref 65–99)
Potassium: 4.6 mmol/L (ref 3.5–5.2)
SODIUM: 144 mmol/L (ref 134–144)
TOTAL PROTEIN: 7.1 g/dL (ref 6.0–8.5)

## 2017-10-11 LAB — CBC WITH DIFFERENTIAL/PLATELET
BASOS: 0 %
Basophils Absolute: 0 10*3/uL (ref 0.0–0.2)
EOS (ABSOLUTE): 0.2 10*3/uL (ref 0.0–0.4)
EOS: 2 %
HEMATOCRIT: 48.4 % (ref 37.5–51.0)
HEMOGLOBIN: 16.7 g/dL (ref 13.0–17.7)
IMMATURE GRANULOCYTES: 2 %
Immature Grans (Abs): 0.3 10*3/uL — ABNORMAL HIGH (ref 0.0–0.1)
Lymphocytes Absolute: 2.4 10*3/uL (ref 0.7–3.1)
Lymphs: 22 %
MCH: 33.1 pg — ABNORMAL HIGH (ref 26.6–33.0)
MCHC: 34.5 g/dL (ref 31.5–35.7)
MCV: 96 fL (ref 79–97)
MONOCYTES: 7 %
Monocytes Absolute: 0.8 10*3/uL (ref 0.1–0.9)
NEUTROS PCT: 67 %
Neutrophils Absolute: 7 10*3/uL (ref 1.4–7.0)
Platelets: 321 10*3/uL (ref 150–450)
RBC: 5.05 x10E6/uL (ref 4.14–5.80)
RDW: 14.1 % (ref 12.3–15.4)
WBC: 10.6 10*3/uL (ref 3.4–10.8)

## 2017-10-11 LAB — T4, FREE: Free T4: 1.11 ng/dL (ref 0.82–1.77)

## 2017-10-11 LAB — LIPID PANEL
Chol/HDL Ratio: 3.9 ratio (ref 0.0–5.0)
Cholesterol, Total: 201 mg/dL — ABNORMAL HIGH (ref 100–199)
HDL: 51 mg/dL (ref >39)
LDL Calculated: 124 mg/dL — ABNORMAL HIGH (ref 0–99)
Triglycerides: 129 mg/dL (ref 0–149)
VLDL Cholesterol Cal: 26 mg/dL (ref 5–40)

## 2017-10-11 LAB — TSH: TSH: 1.76 u[IU]/mL (ref 0.450–4.500)

## 2017-10-11 LAB — VITAMIN D 25 HYDROXY (VIT D DEFICIENCY, FRACTURES): Vit D, 25-Hydroxy: 19.8 ng/mL — ABNORMAL LOW (ref 30.0–100.0)

## 2017-10-17 ENCOUNTER — Telehealth: Payer: Self-pay | Admitting: Family Medicine

## 2017-10-17 NOTE — Telephone Encounter (Signed)
Patient states medical left him a message to call office--- [facility]  --glh

## 2017-10-18 ENCOUNTER — Other Ambulatory Visit: Payer: Self-pay

## 2017-10-18 DIAGNOSIS — E559 Vitamin D deficiency, unspecified: Secondary | ICD-10-CM

## 2017-10-18 MED ORDER — VITAMIN D (ERGOCALCIFEROL) 1.25 MG (50000 UNIT) PO CAPS: 50000.0000 [IU] | ORAL_CAPSULE | ORAL | 3 refills | Status: DC

## 2017-10-18 NOTE — Telephone Encounter (Signed)
Called with lab results and left message to call the office.  Please result note. MPulliam, CMA/RT(R)

## 2017-10-18 NOTE — Progress Notes (Signed)
Per Dr. Synthia Innocentpalski's result note on lab sent in Vitamin D 50,000 units. MPulliam, CMA/RT(R)   Dr. Synthia Innocentpalski's note - Pt's Vitamin D level is too low. I recommend pt take a once weekly dose of 50 K IUs. Please send in #12 with 3 refills. Inform patient this will likely be a lifelong thing, and we will monitor it bi-annually.  - In addition to the once weekly prescription dose, pt needs to take an OTC daily dose of 5,000 IUs of vitamin D3.   MPulliam, CMA/RT(R)

## 2017-11-09 ENCOUNTER — Ambulatory Visit (INDEPENDENT_AMBULATORY_CARE_PROVIDER_SITE_OTHER): Payer: 59 | Admitting: Psychology

## 2017-11-09 DIAGNOSIS — F410 Panic disorder [episodic paroxysmal anxiety] without agoraphobia: Secondary | ICD-10-CM | POA: Diagnosis not present

## 2017-11-15 ENCOUNTER — Encounter: Payer: Self-pay | Admitting: Family Medicine

## 2017-11-15 ENCOUNTER — Ambulatory Visit: Payer: 59 | Admitting: Family Medicine

## 2017-11-15 VITALS — BP 146/101 | HR 90 | Ht 73.0 in | Wt 252.5 lb

## 2017-11-15 DIAGNOSIS — G4700 Insomnia, unspecified: Secondary | ICD-10-CM | POA: Diagnosis not present

## 2017-11-15 DIAGNOSIS — F4321 Adjustment disorder with depressed mood: Secondary | ICD-10-CM | POA: Diagnosis not present

## 2017-11-15 DIAGNOSIS — F430 Acute stress reaction: Secondary | ICD-10-CM

## 2017-11-15 DIAGNOSIS — F411 Generalized anxiety disorder: Secondary | ICD-10-CM

## 2017-11-15 MED ORDER — BUSPIRONE HCL 7.5 MG PO TABS: ORAL_TABLET | ORAL | 5 refills | Status: DC

## 2017-11-15 MED ORDER — FLUOXETINE HCL 10 MG PO TABS: ORAL_TABLET | ORAL | 1 refills | Status: DC

## 2017-11-15 NOTE — Patient Instructions (Addendum)
Please check your blood pressure on a daily basis after sitting quietly for 15 to 20 minutes.  Please write down your blood pressure and your pulse and please contact us via MyChart or phone call in for 2 weeks to let us know what they are running at home.  Also let us know how you are doing on the new fluoxetine medication and being off the sertraline.    For 8 days- please take 1/2 tablet of the sertraline\ Zoloft every other night ( NOT DAILY).  THEN STOP.  Today, start the fluoxetine 10mg  tabs daily.   Please decrease the BuSpar\ buspirone to 5 mg 3 times daily  Also please continue the amitriptyline at 1 to 2 tablets nightly for sleep.          Steps to Quit Smoking Smoking tobacco can be bad for your health. It can also affect almost every organ in your body. Smoking puts you and people around you at risk for many serious long-lasting (chronic) diseases. Quitting smoking is hard, but it is one of the best things that you can do for your health. It is never too late to quit. What are the benefits of quitting smoking? When you quit smoking, you lower your risk for getting serious diseases and conditions. They can include:  Lung cancer or lung disease.  Heart disease.  Stroke.  Heart attack.  Not being able to have children (infertility).  Weak bones (osteoporosis) and broken bones (fractures).  If you have coughing, wheezing, and shortness of breath, those symptoms may get better when you quit. You may also get sick less often. If you are pregnant, quitting smoking can help to lower your chances of having a baby of low birth weight. What can I do to help me quit smoking? Talk with your doctor about what can help you quit smoking. Some things you can do (strategies) include:  Quitting smoking totally, instead of slowly cutting back how much you smoke over a period of time.  Going to in-person counseling. You are more likely to quit if you go to many counseling  sessions.  Using resources and support systems, such as: ? Agricultural engineer with a Veterinary surgeon. ? Phone quitlines. ? Automotive engineer. ? Support groups or group counseling. ? Text messaging programs. ? Mobile phone apps or applications.  Taking medicines. Some of these medicines may have nicotine in them. If you are pregnant or breastfeeding, do not take any medicines to quit smoking unless your doctor says it is okay. Talk with your doctor about counseling or other things that can help you.  Talk with your doctor about using more than one strategy at the same time, such as taking medicines while you are also going to in-person counseling. This can help make quitting easier. What things can I do to make it easier to quit? Quitting smoking might feel very hard at first, but there is a lot that you can do to make it easier. Take these steps:  Talk to your family and friends. Ask them to support and encourage you.  Call phone quitlines, reach out to support groups, or work with a Veterinary surgeon.  Ask people who smoke to not smoke around you.  Avoid places that make you want (trigger) to smoke, such as: ? Bars. ? Parties. ? Smoke-break areas at work.  Spend time with people who do not smoke.  Lower the stress in your life. Stress can make you want to smoke. Try these things  to help your stress: ? Getting regular exercise. ? Deep-breathing exercises. ? Yoga. ? Meditating. ? Doing a body scan. To do this, close your eyes, focus on one area of your body at a time from head to toe, and notice which parts of your body are tense. Try to relax the muscles in those areas.  Download or buy apps on your mobile phone or tablet that can help you stick to your quit plan. There are many free apps, such as QuitGuide from the [employer] developer(Centers for Disease Control and Prevention). You can find more support from smokefree.gov and other websites.  This information is not intended to replace advice given to  you by your health care provider. Make sure you discuss any questions you have with your health care provider. Document Released: 01/23/2009 Document Revised: 11/25/2015 Document Reviewed: 08/13/2014 Elsevier Interactive Patient Education  2018 [employer]

## 2017-11-15 NOTE — Progress Notes (Signed)
Impression and Recommendations:    1. Adjustment disorder with depressed mood   2. Generalized anxiety disorder/ PTSD   3. Insomnia, unspecified type   4. Emotional crisis, acute reaction to stress     1. Mood - Treatment plan changed today. - Dose of Buspar reduced today. - Sertraline discontinued.  Patient only took half-tablet of Sertraline nightly; never went up on dose.  New medication started today.  - Educated patient at length about medications and changes. - Goal is for patient to have less numbness, more energy and motivation during the day.  - Strongly encouraged patient to drink adequate amounts of water.  Counseling - Encouraged patient to continue visiting with his therapist as recommended.  2. Smoking Cessation - Encouraged patient to continue with smoking cessation goals.  3. Sleep Habits - Continue Elavil as prescribed. - Reviewed the importance of proper sleep hygiene and adequate sleep.  4. Blood Pressure - Continue to monitor blood pressure and pulse at home. - Check blood pressure after sitting quietly for 15-20 minutes, and with no prior stimulation physically, emotionally, or through stimulants like caffeine.  - Encouraged patient to make sure to sleep properly to help keep his blood pressure under control.  Discussed the need for prudent lifestyle habits to improve patient's BP.  5. BMI Counseling Explained to patient what BMI refers to, and what it means medically.    Told patient to think about it as a "medical risk stratification measurement" and how increasing BMI is associated with increasing risk/ or worsening state of various diseases such as hypertension, hyperlipidemia, diabetes, premature OA, depression etc.  American Heart Association guidelines for healthy diet, basically Mediterranean diet, and exercise guidelines of 30 minutes 5 days per week or more discussed in detail.  Health counseling performed.  All questions answered.  6.  Lifestyle & Preventative Health Maintenance - Advised patient to continue working toward exercising to improve overall mental, physical, and emotional health.    - Encouraged patient to engage in daily physical activity, especially a formal exercise routine.  Recommended that the patient eventually strive for at least 150 minutes of moderate cardiovascular activity per week according to guidelines established by the [facility].   - Healthy dietary habits encouraged, including low-carb, and high amounts of lean protein in diet.   - Patient should also consume adequate amounts of water - half of body weight in oz of water per day.   Education and routine counseling performed. Handouts provided.  7. Follow-Up - In a few weeks, advised patient to call and update clinic with progress on mood management medications.  - Patient will also update clinic about his blood pressure and pulse readings.  - Return in 3 months for regularly scheduled chronic follow-up, and acute concerns PRN.   No orders of the defined types were placed in this encounter.   Medications Discontinued During This Encounter  Medication Reason  . sertraline (ZOLOFT) 50 MG tablet   . busPIRone (BUSPAR) 10 MG tablet      Meds ordered this encounter  Medications  . FLUoxetine (PROZAC) 10 MG tablet    Sig: 1 tablet daily * 1 week, then 2 po QD    Dispense:  180 tablet    Refill:  1  . busPIRone (BUSPAR) 7.5 MG tablet    Sig: 1 tablet 2 to 3 times daily.    Dispense:  90 tablet    Refill:  5    Gross side effects, risk and  benefits, and alternatives of medications and treatment plan in general discussed with patient.  Patient is aware that all medications have potential side effects and we are unable to predict every side effect or drug-drug interaction that may occur.   Patient will call with any questions prior to using medication if they have concerns.  Expresses verbal understanding and consents to current therapy and  treatment regimen.  No barriers to understanding were identified.  Red flag symptoms and signs discussed in detail.  Patient expressed understanding regarding what to do in case of emergency\urgent symptoms  Please see AVS handed out to patient at the end of our visit for further patient instructions/ counseling done pertaining to today's office visit.   Return for 3-22mo; sooner if problems/ concerns.     Note:  This document was prepared using Dragon voice recognition software and may include unintentional dictation errors.  This document serves as a record of services personally performed by Thomasene Lot, DO. It was created on her behalf by Peggye Fothergill, a trained medical scribe. The creation of this record is based on the scribe's personal observations and the provider's statements to them.   I have reviewed the above medical documentation for accuracy and completeness and I concur.  Thomasene Lot 01/01/18 4:54 PM   ----------------------------------------------------------------------------------------------------------   Subjective:    CC:  Chief Complaint  Patient presents with  . Follow-up    HPI: Albert Hall is a 32 y.o. male who presents to [facility] today for follow-up of mood.   Today states he's "tired as shit;" he worked late yesterday and slept poorly last night.  He is taking Buspar twice daily.  Patient is taking a half tablet of Zoloft nightly.  Notes he is now staying away from alcohol for the most part, aside from partying with friends.  Mood Feels he's been fairly "on the up and up."  He has been performing the Cook's Hookup technique.  However, he thinks that his recent medication changes kind of made things worse when it comes to 'wanting to engage with people."  Notes he didn't stop taking anything; he just made an earlier appointment to come in sooner.  He doesn't want to engage a whole lot.  Has lack of  sensation sexually, as well as loss of sensation in his body or emotional response when his wife is cuddling him.  He feels numb, feels very blunted.  States he will go to work and doesn't participate in one-liners/zingers.  He just sits at the table and listens to everyone, not having a fantastic time or a bad time, but "just kind of there."  Overall feels that work has been fine.  Smoking Cessation Today is his second day not smoking.  He bought a vape; no nicotine in it.  He's just been trying to get the oral fixation under control.  Feels that within another month or two, he will throw it away.  Counseling Patient is going to see a therapist/counselor once monthly.    Sleep Habits He is sleeping okay.  Notes that the first night he took two Elavil, it helped him sleep super well.  Notes sometimes he wakes up way too early, but feels well when he wakes up.  He does not feel drowsy or hung over when he wakes up.  Blood Pressure At home, 130-135-138/mid to upper 70's, sometimes "a touch of into the 80's," but not terrible.  He hasn't been paying attention to  his pulse.    Depression screen [facility] 2/9 11/15/2017 10/10/2017  Decreased Interest 1 0  Down, Depressed, Hopeless 1 0  PHQ - 2 Score 2 0  Altered sleeping 0 3  Tired, decreased energy 1 1  Change in appetite 0 0  Feeling bad or failure about yourself  0 1  Trouble concentrating 0 0  Moving slowly or fidgety/restless 0 0  Suicidal thoughts 0 0  PHQ-9 Score 3 5  Difficult doing work/chores Somewhat difficult -     GAD 7 : Generalized Anxiety Score 11/15/2017 08/02/2016  Nervous, Anxious, on Edge 0 0  Control/stop worrying 0 0  Worry too much - different things 0 0  Trouble relaxing 1 0  Restless 0 0  Easily annoyed or irritable 0 0  Afraid - awful might happen 0 0  Total GAD 7 Score 1 0  Anxiety Difficulty - Not difficult at all     Wt Readings from Last 3 Encounters:  11/15/17 252 lb 8 oz (114.5 kg)  10/10/17 254 lb  14.4 oz (115.6 kg)  08/02/16 246 lb (111.6 kg)   BP Readings from Last 3 Encounters:  11/15/17 (!) 146/101  10/10/17 (!) 146/107  08/02/16 (!) 144/104   Pulse Readings from Last 3 Encounters:  11/15/17 90  10/10/17 (!) 106  08/02/16 72   BMI Readings from Last 3 Encounters:  11/15/17 33.31 kg/m  10/10/17 33.63 kg/m  08/02/16 32.90 kg/m         Patient Care Team    Relationship Specialty Notifications Start End  Thomasene Lot, DO PCP - General Family Medicine  11/10/15      Patient Active Problem List   Diagnosis Date Noted  . BMI >er 30 11/15/2015    Priority: High  . Tobacco abuse 02/11/2014    Priority: High  . GERD (gastroesophageal reflux disease)     Priority: Medium  . Generalized anxiety disorder/ PTSD 01/08/2014    Priority: Medium  . Vitamin D deficiency 12/08/2015    Priority: Low  . fatigue and daytime Somnolence 12/03/2015    Priority: Low  . Adjustment disorder with depressed mood 11/15/2017  . Musculoskeletal chest pain 10/10/2017  . Emotional crisis, acute reaction to stress 10/10/2017  . 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 Excessive drinking of alcohol-situational 10/10/2017  . Insomnia due to psychological stress 10/10/2017  . Elevated blood pressure, situational- likely due to stress ( OK at home when pt checks it- usually less than 130/80 ) 10/10/2017  . mild Fatigue 11/11/2015  . Stressful life event affecting family 09/16/2015  . H/o Insomnia 01/08/2014    Past Medical history, Surgical history, Family history, Social history, Allergies and Medications have been entered into the medical record, reviewed and changed as needed.    Current Meds  Medication Sig  . amitriptyline (ELAVIL) 25 MG tablet Take 2 tablets (50 mg total) by mouth at bedtime. For sleep as needed.  . busPIRone (BUSPAR) 7.5 MG tablet 1 tablet 2 to 3 times daily.  Marland Kitchen omeprazole (PRILOSEC) 20 MG capsule 1 p.o.  twice daily for 4 weeks then 1 p.o. daily  . ranitidine (ZANTAC) 300 MG tablet Take 1 tablet (300 mg total) by mouth 2 (two) times daily.  . Vitamin D, Ergocalciferol, (DRISDOL) 50000 units CAPS capsule Take 1 capsule (50,000 Units total) by mouth every 7 (seven) days.  . [DISCONTINUED] busPIRone (BUSPAR) 10 MG tablet Take 1 tablet (10 mg total) by mouth 3 (  three) times daily.  . [DISCONTINUED] sertraline (ZOLOFT) 50 MG tablet 1/2 tab nightly for 1 wk, then 1 po q hs * 1 wk, then 1.5po qd * 1 wk, then 2 po qd (Patient taking differently: 50 mg. 1/2 tab nightly for 1 wk, then 1 po q hs * 1 wk, then 1.5po qd * 1 wk, then 2 po qd)    Allergies:  Allergies  Allergen Reactions  . Penicillins Anaphylaxis  . Trazodone And Nefazodone Other (See Comments)    Affected hearing, malaise     Review of Systems: Review of Systems: General:   No F/C, wt loss Pulm:   No DIB, SOB, pleuritic chest pain Card:  No CP, palpitations Abd:  No n/v/d or pain Ext:  No inc edema from baseline Psych: no SI/ HI    Objective:   Blood pressure (!) 146/101, pulse 90, height 6\' 1"  (1.854 m), weight 252 lb 8 oz (114.5 kg), SpO2 99 %. Body mass index is 33.31 kg/m. General:  Well Developed, well nourished, appropriate for stated age.  Neuro:  Alert and oriented,  extra-ocular muscles intact  HEENT:  Normocephalic, atraumatic, neck supple, no carotid bruits appreciated  Skin:  no gross rash, warm, pink. Cardiac:  RRR, S1 S2 Respiratory:  ECTA B/L and A/P, Not using accessory muscles, speaking in full sentences- unlabored. Vascular:  Ext warm, no cyanosis apprec.; cap RF less 2 sec. Psych:  No HI/SI, judgement and insight good, Euthymic mood. Full Affect.

## 2017-12-13 ENCOUNTER — Ambulatory Visit: Payer: Self-pay | Admitting: Family Medicine

## 2018-02-15 ENCOUNTER — Ambulatory Visit: Payer: 59 | Admitting: Family Medicine

## 2018-04-01 DIAGNOSIS — J029 Acute pharyngitis, unspecified: Secondary | ICD-10-CM | POA: Diagnosis not present

## 2018-04-07 DIAGNOSIS — H6503 Acute serous otitis media, bilateral: Secondary | ICD-10-CM | POA: Diagnosis not present

## 2018-04-07 DIAGNOSIS — J0140 Acute pansinusitis, unspecified: Secondary | ICD-10-CM | POA: Diagnosis not present

## 2018-04-07 DIAGNOSIS — J40 Bronchitis, not specified as acute or chronic: Secondary | ICD-10-CM | POA: Diagnosis not present

## 2020-03-31 DIAGNOSIS — M545 Low back pain, unspecified: Secondary | ICD-10-CM | POA: Diagnosis not present

## 2020-04-07 ENCOUNTER — Other Ambulatory Visit: Payer: Self-pay | Admitting: Orthopedic Surgery

## 2020-04-07 DIAGNOSIS — M545 Low back pain, unspecified: Secondary | ICD-10-CM

## 2020-04-24 ENCOUNTER — Ambulatory Visit
Admission: RE | Admit: 2020-04-24 | Discharge: 2020-04-24 | Disposition: A | Discharge status: Home / Self Care | Payer: BC Managed Care – PPO | Source: Ambulatory Visit | Attending: Orthopedic Surgery | Admitting: Orthopedic Surgery

## 2020-04-24 ENCOUNTER — Other Ambulatory Visit: Payer: Self-pay

## 2020-04-24 DIAGNOSIS — M545 Low back pain, unspecified: Secondary | ICD-10-CM | POA: Diagnosis not present

## 2020-04-24 DIAGNOSIS — M48061 Spinal stenosis, lumbar region without neurogenic claudication: Secondary | ICD-10-CM | POA: Diagnosis not present

## 2020-04-28 DIAGNOSIS — M545 Low back pain, unspecified: Secondary | ICD-10-CM | POA: Diagnosis not present

## 2020-04-30 DIAGNOSIS — M5126 Other intervertebral disc displacement, lumbar region: Secondary | ICD-10-CM | POA: Diagnosis not present

## 2020-04-30 DIAGNOSIS — M545 Low back pain, unspecified: Secondary | ICD-10-CM | POA: Diagnosis not present

## 2020-04-30 DIAGNOSIS — I10 Essential (primary) hypertension: Secondary | ICD-10-CM | POA: Diagnosis not present

## 2020-05-26 DIAGNOSIS — M5416 Radiculopathy, lumbar region: Secondary | ICD-10-CM | POA: Diagnosis not present

## 2020-06-12 DIAGNOSIS — M5416 Radiculopathy, lumbar region: Secondary | ICD-10-CM | POA: Diagnosis not present

## 2020-07-01 DIAGNOSIS — M5416 Radiculopathy, lumbar region: Secondary | ICD-10-CM | POA: Diagnosis not present

## 2020-07-07 DIAGNOSIS — M5126 Other intervertebral disc displacement, lumbar region: Secondary | ICD-10-CM | POA: Diagnosis not present

## 2020-07-07 DIAGNOSIS — Z6838 Body mass index (BMI) 38.0-38.9, adult: Secondary | ICD-10-CM | POA: Diagnosis not present

## 2020-07-07 DIAGNOSIS — M5416 Radiculopathy, lumbar region: Secondary | ICD-10-CM | POA: Diagnosis not present

## 2020-07-07 DIAGNOSIS — I10 Essential (primary) hypertension: Secondary | ICD-10-CM | POA: Diagnosis not present

## 2020-07-09 DIAGNOSIS — F419 Anxiety disorder, unspecified: Secondary | ICD-10-CM | POA: Diagnosis not present

## 2020-07-09 DIAGNOSIS — I10 Essential (primary) hypertension: Secondary | ICD-10-CM | POA: Diagnosis not present

## 2020-07-11 ENCOUNTER — Other Ambulatory Visit: Payer: Self-pay | Admitting: Neurological Surgery

## 2020-07-21 ENCOUNTER — Other Ambulatory Visit (HOSPITAL_COMMUNITY)
Admission: RE | Admit: 2020-07-21 | Discharge: 2020-07-21 | Disposition: A | Discharge status: Home / Self Care | Payer: BC Managed Care – PPO | Source: Ambulatory Visit | Attending: Neurological Surgery | Admitting: Neurological Surgery

## 2020-07-21 DIAGNOSIS — Z20822 Contact with and (suspected) exposure to covid-19: Secondary | ICD-10-CM | POA: Diagnosis not present

## 2020-07-21 DIAGNOSIS — Z01812 Encounter for preprocedural laboratory examination: Secondary | ICD-10-CM | POA: Diagnosis not present

## 2020-07-21 LAB — SARS CORONAVIRUS 2 (TAT 6-24 HRS): SARS Coronavirus 2: NEGATIVE

## 2020-07-23 ENCOUNTER — Encounter (HOSPITAL_COMMUNITY): Payer: Self-pay | Admitting: Neurological Surgery

## 2020-07-23 MED ORDER — VANCOMYCIN HCL 1500 MG/300ML IV SOLN
1500.0000 mg | INTRAVENOUS | Status: AC
  Administered 2020-07-24: 1500 mg via INTRAVENOUS
  Filled 2020-07-23: qty 300

## 2020-07-23 NOTE — Progress Notes (Signed)
Spoke with wife Herbert Seta for [employer].  Herbert Seta states patient does not have any shortness of breath, fever, cough or chest pain.  PCP - Thomasene Lot, DO - [facility]  Cardiologist - n/a  Chest x-ray - n/a EKG - DOS 07/24/20 Stress Test - n/a ECHO - n/a Cardiac Cath - n/a   STOP now taking any Aspirin (unless otherwise instructed by your surgeon), Aleve, Naproxen, Ibuprofen, Motrin, Advil, Goody's, BC's, all herbal medications, fish oil, and all vitamins.   Coronavirus Screening Covid test on 07/21/20 was negative.  Wife Herbert Seta verbalized understanding of instructions that were given via phone.

## 2020-07-24 ENCOUNTER — Encounter (HOSPITAL_COMMUNITY)
Admission: RE | Disposition: A | Discharge status: Home / Self Care | Payer: Self-pay | Source: Home / Self Care | Attending: Neurological Surgery

## 2020-07-24 ENCOUNTER — Encounter (HOSPITAL_COMMUNITY): Payer: Self-pay | Admitting: Neurological Surgery

## 2020-07-24 ENCOUNTER — Ambulatory Visit (HOSPITAL_COMMUNITY)
Admission: RE | Admit: 2020-07-24 | Discharge: 2020-07-24 | Disposition: A | Discharge status: Home / Self Care | Payer: BC Managed Care – PPO | Attending: Neurological Surgery | Admitting: Neurological Surgery

## 2020-07-24 ENCOUNTER — Ambulatory Visit (HOSPITAL_COMMUNITY): Payer: BC Managed Care – PPO | Admitting: Anesthesiology

## 2020-07-24 ENCOUNTER — Ambulatory Visit (HOSPITAL_COMMUNITY): Payer: BC Managed Care – PPO

## 2020-07-24 ENCOUNTER — Other Ambulatory Visit: Payer: Self-pay

## 2020-07-24 DIAGNOSIS — Z91013 Allergy to seafood: Secondary | ICD-10-CM | POA: Diagnosis not present

## 2020-07-24 DIAGNOSIS — Z888 Allergy status to other drugs, medicaments and biological substances status: Secondary | ICD-10-CM | POA: Insufficient documentation

## 2020-07-24 DIAGNOSIS — K219 Gastro-esophageal reflux disease without esophagitis: Secondary | ICD-10-CM | POA: Diagnosis not present

## 2020-07-24 DIAGNOSIS — Z791 Long term (current) use of non-steroidal anti-inflammatories (NSAID): Secondary | ICD-10-CM | POA: Diagnosis not present

## 2020-07-24 DIAGNOSIS — F17210 Nicotine dependence, cigarettes, uncomplicated: Secondary | ICD-10-CM | POA: Insufficient documentation

## 2020-07-24 DIAGNOSIS — M5126 Other intervertebral disc displacement, lumbar region: Secondary | ICD-10-CM | POA: Diagnosis not present

## 2020-07-24 DIAGNOSIS — M5116 Intervertebral disc disorders with radiculopathy, lumbar region: Secondary | ICD-10-CM | POA: Diagnosis not present

## 2020-07-24 DIAGNOSIS — M48061 Spinal stenosis, lumbar region without neurogenic claudication: Secondary | ICD-10-CM | POA: Diagnosis not present

## 2020-07-24 DIAGNOSIS — Z825 Family history of asthma and other chronic lower respiratory diseases: Secondary | ICD-10-CM | POA: Diagnosis not present

## 2020-07-24 DIAGNOSIS — I10 Essential (primary) hypertension: Secondary | ICD-10-CM | POA: Diagnosis not present

## 2020-07-24 DIAGNOSIS — Z79899 Other long term (current) drug therapy: Secondary | ICD-10-CM | POA: Diagnosis not present

## 2020-07-24 DIAGNOSIS — Z419 Encounter for procedure for purposes other than remedying health state, unspecified: Secondary | ICD-10-CM

## 2020-07-24 DIAGNOSIS — Z833 Family history of diabetes mellitus: Secondary | ICD-10-CM | POA: Insufficient documentation

## 2020-07-24 DIAGNOSIS — E559 Vitamin D deficiency, unspecified: Secondary | ICD-10-CM | POA: Diagnosis not present

## 2020-07-24 DIAGNOSIS — Z880 Allergy status to penicillin: Secondary | ICD-10-CM | POA: Insufficient documentation

## 2020-07-24 DIAGNOSIS — F411 Generalized anxiety disorder: Secondary | ICD-10-CM | POA: Diagnosis not present

## 2020-07-24 DIAGNOSIS — Z981 Arthrodesis status: Secondary | ICD-10-CM | POA: Diagnosis not present

## 2020-07-24 HISTORY — DX: Essential (primary) hypertension: I10

## 2020-07-24 HISTORY — PX: LUMBAR LAMINECTOMY/ DECOMPRESSION WITH MET-RX: SHX5959

## 2020-07-24 LAB — SURGICAL PCR SCREEN
MRSA, PCR: NEGATIVE
Staphylococcus aureus: POSITIVE — AB

## 2020-07-24 LAB — CBC WITH DIFFERENTIAL/PLATELET
Abs Immature Granulocytes: 0.19 10*3/uL — ABNORMAL HIGH (ref 0.00–0.07)
Basophils Absolute: 0.1 10*3/uL (ref 0.0–0.1)
Basophils Relative: 1 %
Eosinophils Absolute: 0 10*3/uL (ref 0.0–0.5)
Eosinophils Relative: 0 %
HCT: 38.5 % — ABNORMAL LOW (ref 39.0–52.0)
Hemoglobin: 12.8 g/dL — ABNORMAL LOW (ref 13.0–17.0)
Immature Granulocytes: 2 %
Lymphocytes Relative: 6 %
Lymphs Abs: 0.7 10*3/uL (ref 0.7–4.0)
MCH: 33.4 pg (ref 26.0–34.0)
MCHC: 33.2 g/dL (ref 30.0–36.0)
MCV: 100.5 fL — ABNORMAL HIGH (ref 80.0–100.0)
Monocytes Absolute: 1.2 10*3/uL — ABNORMAL HIGH (ref 0.1–1.0)
Monocytes Relative: 10 %
Neutro Abs: 9.4 10*3/uL — ABNORMAL HIGH (ref 1.7–7.7)
Neutrophils Relative %: 81 %
Platelets: 144 10*3/uL — ABNORMAL LOW (ref 150–400)
RBC: 3.83 MIL/uL — ABNORMAL LOW (ref 4.22–5.81)
RDW: 15.7 % — ABNORMAL HIGH (ref 11.5–15.5)
WBC: 11.6 10*3/uL — ABNORMAL HIGH (ref 4.0–10.5)
nRBC: 0 % (ref 0.0–0.2)

## 2020-07-24 LAB — BASIC METABOLIC PANEL
Anion gap: 10 (ref 5–15)
BUN: 7 mg/dL (ref 6–20)
CO2: 26 mmol/L (ref 22–32)
Calcium: 8.7 mg/dL — ABNORMAL LOW (ref 8.9–10.3)
Chloride: 101 mmol/L (ref 98–111)
Creatinine, Ser: 0.54 mg/dL — ABNORMAL LOW (ref 0.61–1.24)
GFR, Estimated: >60 mL/min (ref >60)
Glucose, Bld: 143 mg/dL — ABNORMAL HIGH (ref 70–99)
Potassium: 3.2 mmol/L — ABNORMAL LOW (ref 3.5–5.1)
Sodium: 137 mmol/L (ref 135–145)

## 2020-07-24 LAB — PROTIME-INR
INR: 1.3 — ABNORMAL HIGH (ref 0.8–1.2)
Prothrombin Time: 15.8 seconds — ABNORMAL HIGH (ref 11.4–15.2)

## 2020-07-24 SURGERY — LUMBAR LAMINECTOMY/ DECOMPRESSION WITH MET-RX
Anesthesia: General | Site: Spine Lumbar | Laterality: Right

## 2020-07-24 MED ORDER — LIDOCAINE-EPINEPHRINE 1 %-1:100000 IJ SOLN
INTRAMUSCULAR | Status: DC | PRN
  Administered 2020-07-24: 15 mL

## 2020-07-24 MED ORDER — ROCURONIUM BROMIDE 10 MG/ML (PF) SYRINGE
PREFILLED_SYRINGE | INTRAVENOUS | Status: AC
  Filled 2020-07-24: qty 20

## 2020-07-24 MED ORDER — DEXMEDETOMIDINE (PRECEDEX) IN NS 20 MCG/5ML (4 MCG/ML) IV SYRINGE
PREFILLED_SYRINGE | INTRAVENOUS | Status: DC | PRN
  Administered 2020-07-24: 4 ug via INTRAVENOUS
  Administered 2020-07-24: 12 ug via INTRAVENOUS
  Administered 2020-07-24: 4 ug via INTRAVENOUS

## 2020-07-24 MED ORDER — OXYCODONE-ACETAMINOPHEN 7.5-325 MG PO TABS: 1.0000 | ORAL_TABLET | Freq: Four times a day (QID) | ORAL | 0 refills | Status: DC | PRN

## 2020-07-24 MED ORDER — THROMBIN 5000 UNITS EX SOLR: OROMUCOSAL | Status: DC | PRN

## 2020-07-24 MED ORDER — SUGAMMADEX SODIUM 200 MG/2ML IV SOLN
INTRAVENOUS | Status: DC | PRN
  Administered 2020-07-24: 300 mg via INTRAVENOUS

## 2020-07-24 MED ORDER — ORAL CARE MOUTH RINSE: 15.0000 mL | Freq: Once | OROMUCOSAL | Status: AC

## 2020-07-24 MED ORDER — KETAMINE HCL 50 MG/5ML IJ SOSY
PREFILLED_SYRINGE | INTRAMUSCULAR | Status: AC
  Filled 2020-07-24: qty 5

## 2020-07-24 MED ORDER — MUPIROCIN 2 % EX OINT
1.0000 "application " | TOPICAL_OINTMENT | Freq: Once | CUTANEOUS | Status: AC
  Administered 2020-07-24: 1 via TOPICAL
  Filled 2020-07-24: qty 22

## 2020-07-24 MED ORDER — METHYLPREDNISOLONE ACETATE 80 MG/ML IJ SUSP
INTRAMUSCULAR | Status: DC | PRN
  Administered 2020-07-24: 80 mg

## 2020-07-24 MED ORDER — METHOCARBAMOL 500 MG PO TABS: 500.0000 mg | ORAL_TABLET | Freq: Four times a day (QID) | ORAL | 1 refills | Status: DC | PRN

## 2020-07-24 MED ORDER — LIDOCAINE-EPINEPHRINE 1 %-1:100000 IJ SOLN
INTRAMUSCULAR | Status: AC
  Filled 2020-07-24: qty 1

## 2020-07-24 MED ORDER — ROCURONIUM BROMIDE 10 MG/ML (PF) SYRINGE
PREFILLED_SYRINGE | INTRAVENOUS | Status: DC | PRN
  Administered 2020-07-24: 50 mg via INTRAVENOUS

## 2020-07-24 MED ORDER — MIDAZOLAM HCL 2 MG/2ML IJ SOLN
INTRAMUSCULAR | Status: AC
  Filled 2020-07-24: qty 2

## 2020-07-24 MED ORDER — CHLORHEXIDINE GLUCONATE CLOTH 2 % EX PADS: 6.0000 | MEDICATED_PAD | Freq: Once | CUTANEOUS | Status: DC

## 2020-07-24 MED ORDER — PROPOFOL 10 MG/ML IV BOLUS
INTRAVENOUS | Status: DC | PRN
  Administered 2020-07-24: 200 mg via INTRAVENOUS

## 2020-07-24 MED ORDER — EPHEDRINE 5 MG/ML INJ
INTRAVENOUS | Status: AC
  Filled 2020-07-24: qty 10

## 2020-07-24 MED ORDER — BUPIVACAINE-EPINEPHRINE (PF) 0.5% -1:200000 IJ SOLN
INTRAMUSCULAR | Status: DC | PRN
  Administered 2020-07-24: 15 mL

## 2020-07-24 MED ORDER — PHENYLEPHRINE 40 MCG/ML (10ML) SYRINGE FOR IV PUSH (FOR BLOOD PRESSURE SUPPORT)
PREFILLED_SYRINGE | INTRAVENOUS | Status: AC
  Filled 2020-07-24: qty 20

## 2020-07-24 MED ORDER — KETAMINE HCL 10 MG/ML IJ SOLN
INTRAMUSCULAR | Status: DC | PRN
  Administered 2020-07-24: 10 mg via INTRAVENOUS
  Administered 2020-07-24: 30 mg via INTRAVENOUS

## 2020-07-24 MED ORDER — DEXAMETHASONE SODIUM PHOSPHATE 10 MG/ML IJ SOLN
INTRAMUSCULAR | Status: AC
  Filled 2020-07-24: qty 2

## 2020-07-24 MED ORDER — MIDAZOLAM HCL 2 MG/2ML IJ SOLN
INTRAMUSCULAR | Status: DC | PRN
  Administered 2020-07-24: 2 mg via INTRAVENOUS

## 2020-07-24 MED ORDER — METHYLPREDNISOLONE ACETATE 80 MG/ML IJ SUSP
INTRAMUSCULAR | Status: AC
  Filled 2020-07-24: qty 1

## 2020-07-24 MED ORDER — 0.9 % SODIUM CHLORIDE (POUR BTL) OPTIME
TOPICAL | Status: DC | PRN
  Administered 2020-07-24: 1000 mL

## 2020-07-24 MED ORDER — DEXAMETHASONE SODIUM PHOSPHATE 10 MG/ML IJ SOLN
INTRAMUSCULAR | Status: DC | PRN
  Administered 2020-07-24: 10 mg via INTRAVENOUS

## 2020-07-24 MED ORDER — FENTANYL CITRATE (PF) 250 MCG/5ML IJ SOLN
INTRAMUSCULAR | Status: DC | PRN
  Administered 2020-07-24 (×2): 50 ug via INTRAVENOUS
  Administered 2020-07-24: 150 ug via INTRAVENOUS

## 2020-07-24 MED ORDER — CHLORHEXIDINE GLUCONATE 0.12 % MT SOLN
15.0000 mL | Freq: Once | OROMUCOSAL | Status: AC
  Administered 2020-07-24: 15 mL via OROMUCOSAL
  Filled 2020-07-24: qty 15

## 2020-07-24 MED ORDER — FENTANYL CITRATE (PF) 250 MCG/5ML IJ SOLN
INTRAMUSCULAR | Status: AC
  Filled 2020-07-24: qty 5

## 2020-07-24 MED ORDER — ONDANSETRON HCL 4 MG/2ML IJ SOLN
INTRAMUSCULAR | Status: AC
  Filled 2020-07-24: qty 4

## 2020-07-24 MED ORDER — THROMBIN 5000 UNITS EX SOLR
CUTANEOUS | Status: AC
  Filled 2020-07-24: qty 5000

## 2020-07-24 MED ORDER — PROPOFOL 10 MG/ML IV BOLUS
INTRAVENOUS | Status: AC
  Filled 2020-07-24: qty 20

## 2020-07-24 MED ORDER — PHENYLEPHRINE 40 MCG/ML (10ML) SYRINGE FOR IV PUSH (FOR BLOOD PRESSURE SUPPORT)
PREFILLED_SYRINGE | INTRAVENOUS | Status: DC | PRN
  Administered 2020-07-24: 80 ug via INTRAVENOUS

## 2020-07-24 MED ORDER — LIDOCAINE 2% (20 MG/ML) 5 ML SYRINGE
INTRAMUSCULAR | Status: AC
  Filled 2020-07-24: qty 10

## 2020-07-24 MED ORDER — LACTATED RINGERS IV SOLN: INTRAVENOUS | Status: DC

## 2020-07-24 MED ORDER — LIDOCAINE 2% (20 MG/ML) 5 ML SYRINGE
INTRAMUSCULAR | Status: DC | PRN
  Administered 2020-07-24: 100 mg via INTRAVENOUS

## 2020-07-24 MED ORDER — BUPIVACAINE-EPINEPHRINE 0.5% -1:200000 IJ SOLN
INTRAMUSCULAR | Status: AC
  Filled 2020-07-24: qty 1

## 2020-07-24 MED ORDER — SUCCINYLCHOLINE CHLORIDE 200 MG/10ML IV SOSY
PREFILLED_SYRINGE | INTRAVENOUS | Status: DC | PRN
  Administered 2020-07-24: 200 mg via INTRAVENOUS

## 2020-07-24 MED ORDER — ONDANSETRON HCL 4 MG/2ML IJ SOLN
INTRAMUSCULAR | Status: DC | PRN
  Administered 2020-07-24: 4 mg via INTRAVENOUS

## 2020-07-24 SURGICAL SUPPLY — 49 items
BAND RUBBER #18 3X1/16 STRL (MISCELLANEOUS) ×4 IMPLANT
BLADE SURG 11 STRL SS (BLADE) ×2 IMPLANT
BUR MATCHSTICK NEURO 3.0 LAGG (BURR) ×2 IMPLANT
CARTRIDGE OIL MAESTRO DRILL (MISCELLANEOUS) ×1 IMPLANT
DERMABOND ADVANCED (GAUZE/BANDAGES/DRESSINGS) ×1
DERMABOND ADVANCED .7 DNX12 (GAUZE/BANDAGES/DRESSINGS) ×1 IMPLANT
DIFFUSER DRILL AIR PNEUMATIC (MISCELLANEOUS) ×2 IMPLANT
DRAIN JACKSON RD 7FR 3/32 (WOUND CARE) IMPLANT
DRAPE C-ARM 42X72 X-RAY (DRAPES) ×2 IMPLANT
DRAPE LAPAROTOMY 100X72X124 (DRAPES) ×2 IMPLANT
DRAPE MICROSCOPE LEICA (MISCELLANEOUS) ×2 IMPLANT
DRSG OPSITE POSTOP 3X4 (GAUZE/BANDAGES/DRESSINGS) ×2 IMPLANT
DURAPREP 26ML APPLICATOR (WOUND CARE) ×2 IMPLANT
ELECT BLADE INSULATED 6.5IN (ELECTROSURGICAL) ×2
ELECT REM PT RETURN 9FT ADLT (ELECTROSURGICAL) ×2
ELECTRODE BLDE INSULATED 6.5IN (ELECTROSURGICAL) ×1 IMPLANT
ELECTRODE REM PT RTRN 9FT ADLT (ELECTROSURGICAL) ×1 IMPLANT
GLOVE ECLIPSE 8.0 STRL XLNG CF (GLOVE) ×6 IMPLANT
GLOVE SRG 8 PF TXTR STRL LF DI (GLOVE) ×3 IMPLANT
GLOVE SURG LTX SZ8.5 (GLOVE) ×2 IMPLANT
GLOVE SURG PR MICRO ENCORE 7 (GLOVE) ×4 IMPLANT
GLOVE SURG PR MICRO ENCORE 7.5 (GLOVE) ×4 IMPLANT
GLOVE SURG UNDER LTX SZ8.5 (GLOVE) ×2 IMPLANT
GLOVE SURG UNDER POLY LF SZ8 (GLOVE) ×6
GOWN STRL REUS W/ TWL LRG LVL3 (GOWN DISPOSABLE) ×2 IMPLANT
GOWN STRL REUS W/ TWL XL LVL3 (GOWN DISPOSABLE) ×2 IMPLANT
GOWN STRL REUS W/TWL 2XL LVL3 (GOWN DISPOSABLE) ×2 IMPLANT
GOWN STRL REUS W/TWL LRG LVL3 (GOWN DISPOSABLE) ×4
GOWN STRL REUS W/TWL XL LVL3 (GOWN DISPOSABLE) ×4
HEMOSTAT POWDER KIT SURGIFOAM (HEMOSTASIS) ×2 IMPLANT
KIT BASIN OR (CUSTOM PROCEDURE TRAY) ×2 IMPLANT
KIT POSITION SURG JACKSON T1 (MISCELLANEOUS) ×2 IMPLANT
KIT TURNOVER KIT B (KITS) ×2 IMPLANT
MARKER SKIN DUAL TIP RULER LAB (MISCELLANEOUS) ×2 IMPLANT
NEEDLE HYPO 18GX1.5 BLUNT FILL (NEEDLE) ×2 IMPLANT
NEEDLE HYPO 25X1 1.5 SAFETY (NEEDLE) ×2 IMPLANT
NEEDLE SPNL 18GX3.5 QUINCKE PK (NEEDLE) ×2 IMPLANT
NS IRRIG 1000ML POUR BTL (IV SOLUTION) ×2 IMPLANT
OIL CARTRIDGE MAESTRO DRILL (MISCELLANEOUS) ×2
PACK LAMINECTOMY NEURO (CUSTOM PROCEDURE TRAY) ×2 IMPLANT
SPONGE SURGIFOAM ABS GEL 12-7 (HEMOSTASIS) ×2 IMPLANT
SUT VIC AB 0 CT1 27 (SUTURE)
SUT VIC AB 0 CT1 27XBRD ANBCTR (SUTURE) IMPLANT
SUT VIC AB 2-0 CP2 18 (SUTURE) ×2 IMPLANT
SUT VIC AB 3-0 SH 8-18 (SUTURE) IMPLANT
SYR 3ML LL SCALE MARK (SYRINGE) ×4 IMPLANT
TOWEL GREEN STERILE (TOWEL DISPOSABLE) ×2 IMPLANT
TOWEL GREEN STERILE FF (TOWEL DISPOSABLE) ×2 IMPLANT
WATER STERILE IRR 1000ML POUR (IV SOLUTION) ×2 IMPLANT

## 2020-07-24 NOTE — H&P (Signed)
Providing Compassionate, Quality Care - Together  NEUROSURGERY HISTORY & PHYSICAL   Albert Hall is an 35 y.o. male.   Chief Complaint: Right lower extremity pain with weakness HPI: This is a 35 year old male with a history of hypertension and anxiety that complains of right lower extremity pain since December 2021.  His pain radiates down his leg into his anterior lateral shin and foot.  Currently his pain is 8/10.  He has had multiple L5-S1 transforaminal injections with no significant relief.  He still has significant dysesthetic pain in his plantar and dorsum of his foot.  He is noted some weakness and inability to walk due to his pain.  Imaging revealed a large L4-5 eccentric right disc herniation with inferior migration and compression of the L5 nerve root.  He presents today for a right L5-S1 microdiscectomy.  Past Medical History:  Diagnosis Date  . Anxiety   . Generalized anxiety disorder 01/08/2014  . GERD (gastroesophageal reflux disease)   . Hypertension   . Insomnia 01/08/2014  . Male sexual dysfunction   . Tobacco abuse 02/11/2014    History reviewed. No pertinent surgical history.  Family History  Problem Relation Age of Onset  . Asthma Father   . Asthma Sister   . Diabetes Paternal Uncle   . Cancer Neg Hx   . Stroke Neg Hx   . CAD Neg Hx    Social History:  reports that he has been smoking cigarettes. He has a 0.75 pack-year smoking history. He has quit using smokeless tobacco. He reports current alcohol use of about 7.0 - 8.0 standard drinks of alcohol per week. He reports that he does not use drugs.  Allergies:  Allergies  Allergen Reactions  . Penicillins Anaphylaxis  . Shrimp (Diagnostic) Hives  . Trazodone And Nefazodone Other (See Comments)    Affected hearing, malaise    Medications Prior to Admission  Medication Sig Dispense Refill  . amLODipine (NORVASC) 5 MG tablet Take 5 mg by mouth daily.    Marland Kitchen escitalopram (LEXAPRO) 10 MG tablet Take 10  mg by mouth daily.    Marland Kitchen HYDROcodone-acetaminophen (NORCO/VICODIN) 5-325 MG tablet Take 1 tablet by mouth 3 (three) times daily as needed for moderate pain.    Marland Kitchen omeprazole (PRILOSEC) 10 MG capsule Take 20 mg by mouth daily.    Marland Kitchen amitriptyline (ELAVIL) 25 MG tablet Take 2 tablets (50 mg total) by mouth at bedtime. For sleep as needed. (Patient not taking: No sig reported) 180 tablet 1  . busPIRone (BUSPAR) 7.5 MG tablet 1 tablet 2 to 3 times daily. (Patient not taking: No sig reported) 90 tablet 5  . FLUoxetine (PROZAC) 10 MG tablet 1 tablet daily * 1 week, then 2 po QD (Patient not taking: No sig reported) 180 tablet 1  . meloxicam (MOBIC) 15 MG tablet Take 15 mg by mouth daily.    Marland Kitchen omeprazole (PRILOSEC) 20 MG capsule 1 p.o. twice daily for 4 weeks then 1 p.o. daily (Patient not taking: No sig reported) 60 capsule 1  . Vitamin D, Ergocalciferol, (DRISDOL) 50000 units CAPS capsule Take 1 capsule (50,000 Units total) by mouth every 7 (seven) days. (Patient not taking: No sig reported) 12 capsule 3    Results for orders placed or performed during the hospital encounter of 07/24/20 (from the past 48 hour(s))  CBC WITH DIFFERENTIAL     Status: Abnormal   Collection Time: 07/24/20  9:46 AM  Result Value Ref Range   WBC 11.6 (  H) 4.0 - 10.5 K/uL   RBC 3.83 (L) 4.22 - 5.81 MIL/uL   Hemoglobin 12.8 (L) 13.0 - 17.0 g/dL   HCT 02.5 (L) 42.7 - 06.2 %   MCV 100.5 (H) 80.0 - 100.0 fL   MCH 33.4 26.0 - 34.0 pg   MCHC 33.2 30.0 - 36.0 g/dL   RDW 37.6 (H) 28.3 - 15.1 %   Platelets 144 (L) 150 - 400 K/uL   nRBC 0.0 0.0 - 0.2 %   Neutrophils Relative % 81 %   Neutro Abs 9.4 (H) 1.7 - 7.7 K/uL   Lymphocytes Relative 6 %   Lymphs Abs 0.7 0.7 - 4.0 K/uL   Monocytes Relative 10 %   Monocytes Absolute 1.2 (H) 0.1 - 1.0 K/uL   Eosinophils Relative 0 %   Eosinophils Absolute 0.0 0.0 - 0.5 K/uL   Basophils Relative 1 %   Basophils Absolute 0.1 0.0 - 0.1 K/uL   Immature Granulocytes 2 %   Abs Immature  Granulocytes 0.19 (H) 0.00 - 0.07 K/uL    Comment: Performed at [facility], [address] [place], [place] [address]  Protime-INR     Status: Abnormal   Collection Time: 07/24/20  9:46 AM  Result Value Ref Range   Prothrombin Time 15.8 (H) 11.4 - 15.2 seconds   INR 1.3 (H) 0.8 - 1.2    Comment: (NOTE) INR goal varies based on device and disease states. Performed at [facility], [address] [place], [place] [address]    No results found.  ROS 14 point review of systems was obtained which all pertinent positives and negatives are listed in HPI above Blood pressure (!) 143/97, pulse (!) 116, temperature 97.8 F (36.6 C), temperature source Oral, resp. rate 20, height 6' (1.829 m), weight 127 kg, SpO2 96 %. Physical Exam  AOx3, severely anxious PERRLA EOMI Cranial nerves II through XII intact Right lower extremity EHL, DF/PF 4/5 Remainder of strength is 5/5 throughout L5 dermatome dysesthetic pain  Assessment/Plan 35 year old male with  1.  Large right L4-5 disc herniation with severe nerve root compression and weakness  -OR today for right L4-5 microdiscectomy -All risks, benefits and expected outcomes were discussed with the patient and agreed upon.  Answered all of his questions.  He is ready to proceed with surgical intervention.   Thank you for allowing me to participate in this patient's care.  Please do not hesitate to call with questions or concerns.   Monia Pouch, DO Neurosurgeon [facility] Cell: [phone]

## 2020-07-24 NOTE — Anesthesia Procedure Notes (Signed)
Procedure Name: Intubation Date/Time: 07/24/2020 11:50 AM Performed by: Thelma Comp, CRNA Pre-anesthesia Checklist: Patient identified, Emergency Drugs available, Suction available and Patient being monitored Patient Re-evaluated:Patient Re-evaluated prior to induction Oxygen Delivery Method: Circle system utilized Preoxygenation: Pre-oxygenation with 100% oxygen Induction Type: IV induction Ventilation: Mask ventilation without difficulty Laryngoscope Size: Mac and 4 Grade View: Grade I Tube type: Oral Tube size: 7.5 mm Number of attempts: 1 Airway Equipment and Method: Stylet and Oral airway Placement Confirmation: ETT inserted through vocal cords under direct vision,  positive ETCO2 and breath sounds checked- equal and bilateral Secured at: 21 cm Tube secured with: Tape Dental Injury: Teeth and Oropharynx as per pre-operative assessment

## 2020-07-24 NOTE — Transfer of Care (Signed)
Immediate Anesthesia Transfer of Care Note  Patient: Albert Hall  Procedure(s) Performed: Right Lumbar Four-Five Minimally Invasive Microdiscectomy with Metrx (Right Spine Lumbar)  Patient Location: PACU  Anesthesia Type:General  Level of Consciousness: drowsy and patient cooperative  Airway & Oxygen Therapy: Patient Spontanous Breathing and Patient connected to face mask oxygen  Post-op Assessment: Report given to RN and Post -op Vital signs reviewed and stable  Post vital signs: Reviewed and stable  Last Vitals:  Vitals Value Taken Time  BP 152/89 07/24/20 1409  Temp    Pulse 106 07/24/20 1411  Resp 18 07/24/20 1411  SpO2 92 % 07/24/20 1411  Vitals shown include unvalidated device data.  Last Pain:  Vitals:   07/24/20 0919  TempSrc:   PainSc: 4       Patients Stated Pain Goal: 4 (07/24/20 0919)  Complications: No complications documented.

## 2020-07-24 NOTE — Progress Notes (Signed)
   07/24/20 0920  OBSTRUCTIVE SLEEP APNEA  Have you ever been diagnosed with sleep apnea through a sleep study? No  Do you snore loudly (loud enough to be heard through closed doors)?  1  Do you often feel tired, fatigued, or sleepy during the daytime (such as falling asleep during driving or talking to someone)? 0  Has anyone observed you stop breathing during your sleep? 1  Do you have, or are you being treated for high blood pressure? 1  BMI more than 35 kg/m2? 1  Age > 50 (1-yes) 0  Neck circumference greater than:Male 16 inches or larger, Male 17inches or larger? 0  Male Gender (Yes=1) 1  Obstructive Sleep Apnea Score 5

## 2020-07-24 NOTE — Anesthesia Postprocedure Evaluation (Signed)
Anesthesia Post Note  Patient: Albert Hall  Procedure(s) Performed: Right Lumbar Four-Five Minimally Invasive Microdiscectomy with Metrx (Right Spine Lumbar)     Patient location during evaluation: PACU Anesthesia Type: General Level of consciousness: awake and alert Pain management: pain level controlled Vital Signs Assessment: post-procedure vital signs reviewed and stable Respiratory status: spontaneous breathing, nonlabored ventilation, respiratory function stable and patient connected to nasal cannula oxygen Cardiovascular status: blood pressure returned to baseline and stable Postop Assessment: no apparent nausea or vomiting Anesthetic complications: no   No complications documented.  Last Vitals:  Vitals:   07/24/20 1425 07/24/20 1440  BP: (!) 153/89 (!) 141/83  Pulse: (!) 117 (!) 106  Resp: (!) 24 (!) 30  Temp:  37.2 C  SpO2: 95% 91%    Last Pain:  Vitals:   07/24/20 1440  TempSrc:   PainSc: Asleep    LLE Motor Response: Purposeful movement (07/24/20 1440) LLE Sensation: No numbness (07/24/20 1440) RLE Motor Response: Purposeful movement (07/24/20 1440) RLE Sensation: Full sensation;No numbness (07/24/20 1440)      Kadeisha Betsch S

## 2020-07-24 NOTE — Op Note (Signed)
Date of service: 07/25/2018  PREOP DIAGNOSIS: Lumbar disc herniation, L4-5, right, with severe stenosis and right lower extremity L5 radiculopathy  POSTOP DIAGNOSIS: Same  PROCEDURE: 1.  Right L4-5 minimally invasive laminectomy, partial facetectomy and microdiscectomy for decompression of nerve root L5, right 2. Use of operating microscope 3. Use of intraoperative fluoroscopy  SURGEON: Dr. Kendell Bane C. Aleysha Meckler, DO  ASSISTANT: Dr. Barnett Abu, MD  ANESTHESIA: General Endotracheal  EBL: 30 cc  SPECIMENS: None  DRAINS: None  COMPLICATIONS: None  CONDITION: Hemodynamically stable  HISTORY: Albert Hall is a 35 y.o. male presented with right lower extremity L5 radiculopathy resistant to conservative measures epidural steroid injection and slight weakness and numbness L5 dermatome.  We discussed microdiscectomy given his failed conservative measures.  All risks, benefits and expected outcomes were discussed and agreed upon.  PROCEDURE IN DETAIL: After informed consent was obtained and witnessed, the patient was brought to the operating room. After induction of general anesthesia, the patient was positioned on the operative table in the prone position with all pressure points meticulously padded. The skin of the low back was then prepped and draped in the usual sterile fashion.  Under fluoroscopy, the L 4-5 level was identified and marked out on the skin, and after timeout was conducted. Skin incision was then made sharply with a 10 blade and Bovie electrocautery was used to dissect the subcutaneous tissue until the lumbodorsal fascia was identified. The fascia was then incised using Bovie electrocautery and the lamina at the L4-5 levels was identified and dissection was carried out in the subperiosteal plane using the Metrx dilators on the patient's right.  An appropriate sized Metrx tube was placed, and intraoperative x-ray was taken to confirm appropriate placement.  The microscope was  sterilely draped and brought into the field and used for the remainder of the case.  Using Bovie electrocautery, soft tissue was cleared from the lamina and medial facet.  Using a high-speed drill, laminotomy was completed with a partial medial facetectomy. The ligamentum flavum was then identified and removed and the lateral edge of the thecal sac was identified. This was then traced down to identify the traversing nerve root. Dissection was then carried out superior and lateral to the nerve root to identify the disc herniation. The posterior annulus was then coagulated with bipolar cautery and incised with an 11 blade. Of note there was a large broad disc herniation centrally with some calcification into the lateral recess. Using a combination of dissectors, curettes, and rongeurs, the herniated disc fragment was carefully removed.   The central calcified portion was resected using Kerrison rongeurs and micro curettes.  The decompression of the nerve root was confirmed using a dissector.  The right L5 nerve root appeared free and decompressed and pulsatile.  It was followed along the lateral recess into the L5 foramen and noted to be decompressed.  Hemostasis was then secured using a combination of morcellized Gelfoam and thrombin and bipolar electrocautery. The wound is irrigated with copious amounts of antibiotic saline irrigation.  The traversing and exiting nerve roots were felt with a Murphy ball probe and noted to be decompressed.  The wound was noted to be excellently hemostatic.  The nerve root was then covered with a long-acting steroid solution.  The Metrx tube was then removed, hemostasis was achieved with bipolar cautery and the wound is closed in layers using 2-0 Vicryl stitches. The skin was closed using standard skin glue.  Sterile dressing was applied.  Drapes were taken down.  At the end of the case all sponge, needle, and instrument counts were correct. The patient was then fully  transferred to the stretcher, extubated and taken to the postanesthesia care unit in stable hemodynamic condition.

## 2020-07-24 NOTE — Anesthesia Preprocedure Evaluation (Addendum)
Anesthesia Evaluation  Patient identified by MRN, date of birth, ID band Patient awake    Reviewed: Allergy & Precautions, NPO status , Patient's Chart, lab work & pertinent test results  Airway Mallampati: III  TM Distance: <3 FB Neck ROM: Full    Dental no notable dental hx.    Pulmonary Current Smoker and Patient abstained from smoking.,    Pulmonary exam normal breath sounds clear to auscultation       Cardiovascular hypertension, Pt. on medications Normal cardiovascular exam Rhythm:Regular Rate:Normal     Neuro/Psych Anxiety negative neurological ROS     GI/Hepatic GERD  Medicated,(+)     substance abuse  alcohol use,   Endo/Other  Morbid obesity  Renal/GU negative Renal ROS  negative genitourinary   Musculoskeletal negative musculoskeletal ROS (+)   Abdominal (+) + obese,   Peds negative pediatric ROS (+)  Hematology negative hematology ROS (+)   Anesthesia Other Findings   Reproductive/Obstetrics negative OB ROS                            Anesthesia Physical Anesthesia Plan  ASA: III  Anesthesia Plan: General   Post-op Pain Management:    Induction: Intravenous  PONV Risk Score and Plan: 2 and Ondansetron, Dexamethasone and Treatment may vary due to age or medical condition  Airway Management Planned: Oral ETT  Additional Equipment:   Intra-op Plan:   Post-operative Plan: Extubation in OR  Informed Consent: I have reviewed the patients History and Physical, chart, labs and discussed the procedure including the risks, benefits and alternatives for the proposed anesthesia with the patient or authorized representative who has indicated his/her understanding and acceptance.     Dental advisory given  Plan Discussed with: CRNA and Surgeon  Anesthesia Plan Comments:        Anesthesia Quick Evaluation

## 2020-07-24 NOTE — Progress Notes (Addendum)
   Providing Compassionate, Quality Care - Together  NEUROSURGERY PROGRESS NOTE   S: pt s/e in pacu, states his numbness in leg is improved  O: EXAM:  BP (!) 141/83 (BP Location: Left Arm)   Pulse 99   Temp 98.9 F (37.2 C)   Resp (!) 30   Ht 6' (1.829 m)   Wt 127 kg   SpO2 94%   BMI 37.97 kg/m   Awake, alert Speech fluent, appropriate  CNs grossly intact  5/5 BUE 5/5 LLE 4+/5 RLE EHL, 5/5 KE/KF/DF/PF Incision c/d/i  ASSESSMENT:  35 y.o. male with   1. R L4-5 HNP with severe R L5 radiculopathy  S/p R L4-5 MD on 07/24/2020  PLAN: - pain improved - dc home when stable - doing well -Dressing instructions given - rx sent  Thank you for allowing me to participate in this patient's care.  Please do not hesitate to call with questions or concerns.   Monia Pouch, DO Neurosurgeon [facility] Cell: [phone]

## 2020-07-25 ENCOUNTER — Encounter (HOSPITAL_COMMUNITY): Payer: Self-pay | Admitting: Neurological Surgery

## 2020-07-29 ENCOUNTER — Ambulatory Visit: Payer: BC Managed Care – PPO | Admitting: Nurse Practitioner

## 2020-07-30 ENCOUNTER — Encounter (HOSPITAL_COMMUNITY): Payer: Self-pay | Admitting: Neurological Surgery

## 2020-08-14 ENCOUNTER — Other Ambulatory Visit: Payer: Self-pay

## 2020-08-14 ENCOUNTER — Encounter: Payer: Self-pay | Admitting: Nurse Practitioner

## 2020-08-14 ENCOUNTER — Ambulatory Visit: Payer: BC Managed Care – PPO | Admitting: Nurse Practitioner

## 2020-08-14 VITALS — BP 129/82 | HR 109 | Temp 98.0°F | Ht 72.0 in | Wt 268.6 lb

## 2020-08-14 DIAGNOSIS — I10 Essential (primary) hypertension: Secondary | ICD-10-CM | POA: Diagnosis not present

## 2020-08-14 DIAGNOSIS — F430 Acute stress reaction: Secondary | ICD-10-CM

## 2020-08-14 DIAGNOSIS — F411 Generalized anxiety disorder: Secondary | ICD-10-CM

## 2020-08-14 DIAGNOSIS — K219 Gastro-esophageal reflux disease without esophagitis: Secondary | ICD-10-CM | POA: Diagnosis not present

## 2020-08-14 DIAGNOSIS — Z7689 Persons encountering health services in other specified circumstances: Secondary | ICD-10-CM | POA: Diagnosis not present

## 2020-08-14 DIAGNOSIS — F1010 Alcohol abuse, uncomplicated: Secondary | ICD-10-CM

## 2020-08-14 MED ORDER — AMLODIPINE BESYLATE 5 MG PO TABS: 5.0000 mg | ORAL_TABLET | Freq: Every day | ORAL | 1 refills | Status: DC

## 2020-08-14 MED ORDER — ALPRAZOLAM 0.5 MG PO TABS: 0.5000 mg | ORAL_TABLET | Freq: Every day | ORAL | 0 refills | Status: DC | PRN

## 2020-08-14 MED ORDER — OMEPRAZOLE 20 MG PO CPDR: DELAYED_RELEASE_CAPSULE | ORAL | 1 refills | Status: DC

## 2020-08-14 MED ORDER — ESCITALOPRAM OXALATE 10 MG PO TABS: 15.0000 mg | ORAL_TABLET | Freq: Every day | ORAL | 0 refills | Status: DC

## 2020-08-14 NOTE — Patient Instructions (Signed)
[email or web address]">  Generalized Anxiety Disorder, Adult Generalized anxiety disorder (GAD) is a mental health condition. Unlike normal worries, anxiety related to GAD is not triggered by a specific event. These worries do not fade or get better with time. GAD interferes with relationships, work, and school. GAD symptoms can vary from mild to severe. People with severe GAD can have intense waves of anxiety with physical symptoms that are similar to panic attacks. What are the causes? The exact cause of GAD is not known, but the following are believed to have an impact:  Differences in natural brain chemicals.  Genes passed down from parents to children.  Differences in the way threats are perceived.  Development during childhood.  Personality. What increases the risk? The following factors may make you more likely to develop this condition:  Being male.  Having a family history of anxiety disorders.  Being very shy.  Experiencing very stressful life events, such as the death of a loved one.  Having a very stressful family environment. What are the signs or symptoms? People with GAD often worry excessively about many things in their lives, such as their health and family. Symptoms may also include:  Mental and emotional symptoms: ? Worrying excessively about natural disasters. ? Fear of being late. ? Difficulty concentrating. ? Fears that others are judging your performance.  Physical symptoms: ? Fatigue. ? Headaches, muscle tension, muscle twitches, trembling, or feeling shaky. ? Feeling like your heart is pounding or beating very fast. ? Feeling out of breath or like you cannot take a deep breath. ? Having trouble falling asleep or staying asleep, or experiencing restlessness. ? Sweating. ? Nausea, diarrhea, or irritable bowel syndrome (IBS).  Behavioral symptoms: ? Experiencing erratic moods or irritability. ? Avoidance of new situations. ? Avoidance of  people. ? Extreme difficulty making decisions. How is this diagnosed? This condition is diagnosed based on your symptoms and medical history. You will also have a physical exam. Your health care provider may perform tests to rule out other possible causes of your symptoms. To be diagnosed with GAD, a person must have anxiety that:  Is out of his or her control.  Affects several different aspects of his or her life, such as work and relationships.  Causes distress that makes him or her unable to take part in normal activities.  Includes at least three symptoms of GAD, such as restlessness, fatigue, trouble concentrating, irritability, muscle tension, or sleep problems. Before your health care provider can confirm a diagnosis of GAD, these symptoms must be present more days than they are not, and they must last for 6 months or longer. How is this treated? This condition may be treated with:  Medicine. Antidepressant medicine is usually prescribed for long-term daily control. Anti-anxiety medicines may be added in severe cases, especially when panic attacks occur.  Talk therapy (psychotherapy). Certain types of talk therapy can be helpful in treating GAD by providing support, education, and guidance. Options include: ? Cognitive behavioral therapy (CBT). People learn coping skills and self-calming techniques to ease their physical symptoms. They learn to identify unrealistic thoughts and behaviors and to replace them with more appropriate thoughts and behaviors. ? Acceptance and commitment therapy (ACT). This treatment teaches people how to be mindful as a way to cope with unwanted thoughts and feelings. ? Biofeedback. This process trains you to manage your body's response (physiological response) through breathing techniques and relaxation methods. You will work with a therapist while machines are used to monitor your physical   symptoms.  Stress management techniques. These include yoga,  meditation, and exercise. A mental health specialist can help determine which treatment is best for you. Some people see improvement with one type of therapy. However, other people require a combination of therapies.   Follow these instructions at home: Lifestyle  Maintain a consistent routine and schedule.  Anticipate stressful situations. Create a plan, and allow extra time to work with your plan.  Practice stress management or self-calming techniques that you have learned from your therapist or your health care provider. General instructions  Take over-the-counter and prescription medicines only as told by your health care provider.  Understand that you are likely to have setbacks. Accept this and be kind to yourself as you persist to take better care of yourself.  Recognize and accept your accomplishments, even if you judge them as small.  Keep all follow-up visits as told by your health care provider. This is important. Contact a health care provider if:  Your symptoms do not get better.  Your symptoms get worse.  You have signs of depression, such as: ? A persistently sad or irritable mood. ? Loss of enjoyment in activities that used to bring you joy. ? Change in weight or eating. ? Changes in sleeping habits. ? Avoiding friends or family members. ? Loss of energy for normal tasks. ? Feelings of guilt or worthlessness. Get help right away if:  You have serious thoughts about hurting yourself or others. If you ever feel like you may hurt yourself or others, or have thoughts about taking your own life, get help right away. Go to your nearest emergency department or:  Call your local emergency services (911 in the [place]  Call a suicide crisis helpline, such as the National Suicide Prevention Lifeline at 1-800-273-8255. This is open 24 hours a day in the [place]  Text the Crisis Text Line at 741741 (in the [place] Summary  Generalized anxiety disorder (GAD) is a mental  health condition that involves worry that is not triggered by a specific event.  People with GAD often worry excessively about many things in their lives, such as their health and family.  GAD may cause symptoms such as restlessness, trouble concentrating, sleep problems, frequent sweating, nausea, diarrhea, headaches, and trembling or muscle twitching.  A mental health specialist can help determine which treatment is best for you. Some people see improvement with one type of therapy. However, other people require a combination of therapies. This information is not intended to replace advice given to you by your health care provider. Make sure you discuss any questions you have with your health care provider. Document Revised: 01/17/2019 Document Reviewed: 01/17/2019 Elsevier Patient Education  2021 Elsevier Inc.  

## 2020-08-14 NOTE — Progress Notes (Signed)
New Patient Office Visit  Subjective:  Patient ID: Albert Hall, male    DOB: [birth year]  Age: 35 y.o. MRN: [id number]  CC:  Chief Complaint  Patient presents with  . New Patient (Initial Visit)    HPI Albert Hall presents to establish new primary care provider.  He states that he is having severe anxiety. This is especially bad in crowds and when going to the doctor's office. He states that he will leave places which are crowded due to the severity of his anxiety. He has been having severe panic attacks. He states that he had a panic attack after he had discectomy procedure on L4/L6 and L5/S1. He states that procedure was done as outpatient. Continues to see surgeon. Surgeon has been happy with the progress patinet has made. Patient is walking with his wife on the track at the local school. He states that he is able to walk about 3/4 mile before needing a rest. He does this about three times weekly. He has been weaned off of his pain medication and is now taking muscle relaxer only.  For his anxiety, the patient's previous provider started him on lexapro 31m daily. The patient states that he has noted the biggest improvement on this medication. He states that, in the past, he did have prescription for BZO to take as needed. He states this helped a great deal when needed. He has tried trazodone and buspirone as well. These medications caused negative side effects. I reviewed his PDMP profile. His overdose risk score is 270, however, the patient is no longer taking narcotic pain medications. Theses medications were weaned off after he had discectomy procedure.  The patient's blood pressure is well managed on current medications. He denies chest pain, chest pressure, or shortness of breath. He does suffer from GERD. He is currently taking OTC nexium which is getting expensive. Has tried to go without this medication, however, he does get moderate heartburn without it.    Past Medical  History:  Diagnosis Date  . Anxiety   . Generalized anxiety disorder 01/08/2014  . GERD (gastroesophageal reflux disease)   . Hypertension   . Insomnia 01/08/2014  . Male sexual dysfunction   . Tobacco abuse 02/11/2014    Past Surgical History:  Procedure Laterality Date  . LUMBAR LAMINECTOMY/ DECOMPRESSION WITH MET-RX Right 07/24/2020   Procedure: Right Lumbar Four-Five Minimally Invasive Microdiscectomy with Metrx;  Surgeon: DKarsten Ro DO;  [place] [facility]  Service: Neurosurgery;  Laterality: Right;    Family History  Problem Relation Age of Onset  . Alcoholism Mother   . High blood pressure Mother   . Asthma Father   . Alcoholism Father   . Depression Father   . Asthma Sister   . Diabetes Paternal Uncle   . Cancer Neg Hx   . Stroke Neg Hx   . CAD Neg Hx     Social History   Socioeconomic History  . Marital status: Married    Spouse name: HNira Conn . Number of children: 0  . Years of education: 141 . Highest education level: Not on file  Occupational History  . 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 Smoking status: Current Every Day Smoker    Packs/day: 0.25    Years: 3.00    Pack years: 0.75    Types: Cigarettes  . Smokeless tobacco: Former UNetwork engineerand Sexual Activity  . Alcohol use: Yes    Alcohol/week: 7.0 -  8.0 standard drinks    Types: 7 Cans of beer per week    Comment: 1 beer nightly  . Drug use: No  . Sexual activity: Yes    Partners: Female  Other Topics Concern  . 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   Activity: no regular exercise   Diet: good water, fruits/vegetables daily   Social Determinants of Health   Financial Resource Strain: Not on file  Food Insecurity: Not on file  Transportation Needs: Not on file  Physical Activity: Not on file  Stress: Not on file  Social Connections: Not on  file  Intimate Partner Violence: Not on file    ROS Review of Systems  Constitutional: Positive for fatigue. Negative for activity change, chills and fever.  HENT: Negative for congestion and sinus pain.   Eyes: Negative.   Respiratory: Negative for cough, chest tightness and wheezing.   Cardiovascular: Negative for chest pain and palpitations.  Gastrointestinal: Negative for constipation, diarrhea, nausea and vomiting.       Acid reflux.   Endocrine: Negative for cold intolerance, heat intolerance, polydipsia and polyuria.  Musculoskeletal: Positive for back pain. Negative for myalgias.       Patient continues to see surgeon   Skin: Negative for rash.  Allergic/Immunologic: Negative.   Neurological: Negative for dizziness, weakness and headaches.  Psychiatric/Behavioral: Positive for dysphoric mood. The patient is nervous/anxious.   All other systems reviewed and are negative.   Objective:   Today's Vitals   08/14/20 1542 08/14/20 1654  BP: 129/82   Pulse: (!) 119 (!) 109  Temp: 98 F (36.7 C)   SpO2: 96%   Weight: 268 lb 9.6 oz (121.8 kg)   Height: 6' (1.829 m)    Body mass index is 36.43 kg/m.   Physical Exam Vitals and nursing note reviewed.  Constitutional:      Appearance: Normal appearance. He is well-developed.     Comments: Anxious appearing.  HENT:     Head: Normocephalic.     Mouth/Throat:     Mouth: Mucous membranes are moist.  Eyes:     Conjunctiva/sclera: Conjunctivae normal.     Pupils: Pupils are equal, round, and reactive to light.  Cardiovascular:     Rate and Rhythm: Normal rate and regular rhythm.     Pulses: Normal pulses.     Heart sounds: Normal heart sounds.  Pulmonary:     Effort: Pulmonary effort is normal.     Breath sounds: Normal breath sounds.  Abdominal:     Palpations: Abdomen is soft.  Musculoskeletal:        General: Normal range of motion.     Cervical back: Normal range of motion and neck supple.  Skin:    General:  Skin is warm and dry.     Capillary Refill: Capillary refill takes less than 2 seconds.  Neurological:     General: No focal deficit present.     Mental Status: He is alert and oriented to person, place, and time.  Psychiatric:        Attention and Perception: Attention and perception normal.        Mood and Affect: Mood is anxious and depressed.        Speech: Speech normal.        Behavior: Behavior normal. Behavior is cooperative.  Thought Content: Thought content normal.        Cognition and Memory: Cognition and memory normal.        Judgment: Judgment normal.     Assessment & Plan:  1. Encounter to establish care Appointment today to establish new primary care provider.   2. Essential hypertension Stable. Continue bp medication as prescribed. Refilled amlodipine today. - amLODipine (NORVASC) 5 MG tablet; Take 1 tablet (5 mg total) by mouth daily.  Dispense: 90 tablet; Refill: 1  3. Gastroesophageal reflux disease D/c use off OTC nexium. Start omeprazole 69m twice daily. Encouraged him to avoid trigger foods.  - omeprazole (PRILOSEC) 20 MG capsule; Take 1 capsule po BID for GERD  Dispense: 60 capsule; Refill: 1  4. Generalized anxiety disorder/ PTSD Increase escitalopram to 165mdaily. Reassess at next visit.  - escitalopram (LEXAPRO) 10 MG tablet; Take 1.5 tablets (15 mg total) by mouth daily.  Dispense: 135 tablet; Refill: 0  5. Emotional crisis, acute reaction to stress Single prescription for alprazolam 0.5m30mablets which may be taken once daily if needed for severe anxiety. Will reassess at next visit.  - ALPRAZolam (XANAX) 0.5 MG tablet; Take 1 tablet (0.5 mg total) by mouth daily as needed for anxiety.  Dispense: 30 tablet; Refill: 0  Problem List Items Addressed This Visit      Cardiovascular and Mediastinum   Essential hypertension   Relevant Medications   amLODipine (NORVASC) 5 MG tablet     Digestive   Gastroesophageal reflux disease   Relevant  Medications   omeprazole (PRILOSEC) 20 MG capsule     Other   Generalized anxiety disorder/ PTSD (Chronic)   Relevant Medications   escitalopram (LEXAPRO) 10 MG tablet   ALPRAZolam (XANAX) 0.5 MG tablet   Emotional crisis, acute reaction to stress (Chronic)   Relevant Medications   escitalopram (LEXAPRO) 10 MG tablet   ALPRAZolam (XANAX) 0.5 MG tablet   Encounter to establish care - Primary      Outpatient Encounter Medications as of 08/14/2020  Medication Sig  . ALPRAZolam (XANAX) 0.5 MG tablet Take 1 tablet (0.5 mg total) by mouth daily as needed for anxiety.  . aMarland KitchenLODipine (NORVASC) 5 MG tablet Take 1 tablet (5 mg total) by mouth daily.  . eMarland Kitchencitalopram (LEXAPRO) 10 MG tablet Take 1.5 tablets (15 mg total) by mouth daily.  . methocarbamol (ROBAXIN) 500 MG tablet Take 1 tablet (500 mg total) by mouth every 6 (six) hours as needed for muscle spasms.  . oMarland Kitcheneprazole (PRILOSEC) 20 MG capsule Take 1 capsule po BID for GERD  . Vitamin D, Ergocalciferol, (DRISDOL) 50000 units CAPS capsule Take 1 capsule (50,000 Units total) by mouth every 7 (seven) days. (Patient not taking: No sig reported)  . [DISCONTINUED] amitriptyline (ELAVIL) 25 MG tablet Take 2 tablets (50 mg total) by mouth at bedtime. For sleep as needed. (Patient not taking: No sig reported)  . [DISCONTINUED] amLODipine (NORVASC) 5 MG tablet Take 5 mg by mouth daily.  . [DISCONTINUED] busPIRone (BUSPAR) 7.5 MG tablet 1 tablet 2 to 3 times daily. (Patient not taking: No sig reported)  . [DISCONTINUED] escitalopram (LEXAPRO) 10 MG tablet Take 10 mg by mouth daily.  . [DISCONTINUED] FLUoxetine (PROZAC) 10 MG tablet 1 tablet daily * 1 week, then 2 po QD (Patient not taking: No sig reported)  . [DISCONTINUED] HYDROcodone-acetaminophen (NORCO/VICODIN) 5-325 MG tablet Take 1 tablet by mouth 3 (three) times daily as needed for moderate pain.  . [DISCONTINUED] omeprazole (PRILOSEC) 10 MG capsule Take  20 mg by mouth daily.  . [DISCONTINUED]  omeprazole (PRILOSEC) 20 MG capsule 1 p.o. twice daily for 4 weeks then 1 p.o. daily (Patient not taking: No sig reported)  . [DISCONTINUED] oxyCODONE-acetaminophen (PERCOCET) 7.5-325 MG tablet Take 1-2 tablets by mouth every 6 (six) hours as needed for severe pain.   No facility-administered encounter medications on file as of 08/14/2020.    Follow-up: Return in about 4 weeks (around 09/11/2020) for mood - needs FBW add Free T4 about one week prior to next visit. Marland Kitchen   Ronnell Freshwater, NP

## 2020-08-30 DIAGNOSIS — Z7689 Persons encountering health services in other specified circumstances: Secondary | ICD-10-CM | POA: Insufficient documentation

## 2020-08-30 DIAGNOSIS — I10 Essential (primary) hypertension: Secondary | ICD-10-CM | POA: Insufficient documentation

## 2020-09-01 ENCOUNTER — Other Ambulatory Visit: Payer: Self-pay | Admitting: Nurse Practitioner

## 2020-09-01 DIAGNOSIS — I10 Essential (primary) hypertension: Secondary | ICD-10-CM

## 2020-09-01 DIAGNOSIS — Z0000 Encounter for general adult medical examination without abnormal findings: Secondary | ICD-10-CM

## 2020-09-04 ENCOUNTER — Other Ambulatory Visit: Payer: Self-pay

## 2020-09-04 ENCOUNTER — Other Ambulatory Visit: Payer: BC Managed Care – PPO

## 2020-09-04 DIAGNOSIS — Z0000 Encounter for general adult medical examination without abnormal findings: Secondary | ICD-10-CM

## 2020-09-04 DIAGNOSIS — I10 Essential (primary) hypertension: Secondary | ICD-10-CM | POA: Diagnosis not present

## 2020-09-05 LAB — COMPREHENSIVE METABOLIC PANEL
ALT: 102 IU/L — ABNORMAL HIGH (ref 0–44)
AST: 175 IU/L — ABNORMAL HIGH (ref 0–40)
Albumin/Globulin Ratio: 0.9 — ABNORMAL LOW (ref 1.2–2.2)
Albumin: 3.3 g/dL — ABNORMAL LOW (ref 4.0–5.0)
Alkaline Phosphatase: 361 IU/L — ABNORMAL HIGH (ref 44–121)
BUN/Creatinine Ratio: 13 (ref 9–20)
BUN: 6 mg/dL (ref 6–20)
Bilirubin Total: 2 mg/dL — ABNORMAL HIGH (ref 0.0–1.2)
CO2: 24 mmol/L (ref 20–29)
Calcium: 8.4 mg/dL — ABNORMAL LOW (ref 8.7–10.2)
Chloride: 97 mmol/L (ref 96–106)
Creatinine, Ser: 0.45 mg/dL — ABNORMAL LOW (ref 0.76–1.27)
Globulin, Total: 3.7 g/dL (ref 1.5–4.5)
Glucose: 103 mg/dL — ABNORMAL HIGH (ref 65–99)
Potassium: 3.5 mmol/L (ref 3.5–5.2)
Sodium: 140 mmol/L (ref 134–144)
Total Protein: 7 g/dL (ref 6.0–8.5)
eGFR: 142 mL/min/{1.73_m2} (ref >59)

## 2020-09-05 LAB — HEMOGLOBIN A1C
Est. average glucose Bld gHb Est-mCnc: 108 mg/dL
Hgb A1c MFr Bld: 5.4 % (ref 4.8–5.6)

## 2020-09-05 LAB — LIPID PANEL
Chol/HDL Ratio: 8.3 ratio — ABNORMAL HIGH (ref 0.0–5.0)
Cholesterol, Total: 223 mg/dL — ABNORMAL HIGH (ref 100–199)
HDL: 27 mg/dL — ABNORMAL LOW (ref >39)
LDL Chol Calc (NIH): 169 mg/dL — ABNORMAL HIGH (ref 0–99)
Triglycerides: 146 mg/dL (ref 0–149)
VLDL Cholesterol Cal: 27 mg/dL (ref 5–40)

## 2020-09-05 LAB — CBC
Hematocrit: 36.1 % — ABNORMAL LOW (ref 37.5–51.0)
Hemoglobin: 12.5 g/dL — ABNORMAL LOW (ref 13.0–17.7)
MCH: 33.3 pg — ABNORMAL HIGH (ref 26.6–33.0)
MCHC: 34.6 g/dL (ref 31.5–35.7)
MCV: 96 fL (ref 79–97)
Platelets: 154 10*3/uL (ref 150–450)
RBC: 3.75 x10E6/uL — ABNORMAL LOW (ref 4.14–5.80)
RDW: 14.7 % (ref 11.6–15.4)
WBC: 11.4 10*3/uL — ABNORMAL HIGH (ref 3.4–10.8)

## 2020-09-05 LAB — T4, FREE: Free T4: 1.33 ng/dL (ref 0.82–1.77)

## 2020-09-05 LAB — TSH: TSH: 2.54 u[IU]/mL (ref 0.450–4.500)

## 2020-09-11 ENCOUNTER — Encounter: Payer: Self-pay | Admitting: Nurse Practitioner

## 2020-09-11 ENCOUNTER — Ambulatory Visit: Payer: BC Managed Care – PPO | Admitting: Nurse Practitioner

## 2020-09-11 ENCOUNTER — Other Ambulatory Visit: Payer: Self-pay

## 2020-09-11 VITALS — BP 126/78 | HR 98 | Temp 99.3°F | Ht 73.0 in | Wt 266.4 lb

## 2020-09-11 DIAGNOSIS — F5104 Psychophysiologic insomnia: Secondary | ICD-10-CM

## 2020-09-11 DIAGNOSIS — F430 Acute stress reaction: Secondary | ICD-10-CM

## 2020-09-11 DIAGNOSIS — R7989 Other specified abnormal findings of blood chemistry: Secondary | ICD-10-CM | POA: Diagnosis not present

## 2020-09-11 DIAGNOSIS — F1010 Alcohol abuse, uncomplicated: Secondary | ICD-10-CM

## 2020-09-11 DIAGNOSIS — I10 Essential (primary) hypertension: Secondary | ICD-10-CM | POA: Diagnosis not present

## 2020-09-11 DIAGNOSIS — F4310 Post-traumatic stress disorder, unspecified: Secondary | ICD-10-CM | POA: Diagnosis not present

## 2020-09-11 DIAGNOSIS — R188 Other ascites: Secondary | ICD-10-CM

## 2020-09-11 MED ORDER — PRAZOSIN HCL 1 MG PO CAPS: 1.0000 mg | ORAL_CAPSULE | Freq: Every day | ORAL | 1 refills | Status: DC

## 2020-09-11 MED ORDER — ALPRAZOLAM 0.5 MG PO TABS: 0.5000 mg | ORAL_TABLET | Freq: Every day | ORAL | 0 refills | Status: DC | PRN

## 2020-09-11 NOTE — Patient Instructions (Signed)
Managing Post-Traumatic Stress Disorder If you have been diagnosed with post-traumatic stress disorder (PTSD), you may be relieved that you now know why you have felt or behaved a certain way. Still, you may feel overwhelmed about the treatment ahead. You may also wonder how to get the support you need and how to deal with the condition day-to-day. If you are living with PTSD, there are ways to help you recover from it and manage your symptoms. How to manage lifestyle changes Managing stress Stress is your body's reaction to life changes and events, both good and bad. Stress can make PTSD worse. Take the following steps to manage stress:  Talk with your health care provider or a counselor if you would like to learn more about techniques to reduce your stress. He or she may suggest some stress reduction techniques such as: ? Muscle relaxation exercises. ? Regular exercise. ? Meditation, yoga, or other mind-body exercises. ? Breathing exercises. ? Listening to quiet music. ? Spending time outside.  Maintain a healthy lifestyle. Eat a healthy diet, exercise regularly, get plenty of sleep, and take time to relax.  Spend time with others. Talk with them about how you are feeling and what kind of support you need. Try not to isolate yourself, even though you may feel like doing that. Isolating yourself can delay your recovery.  Do activities and hobbies that you enjoy.  Pace yourself when doing stressful things. Take breaks, and reward yourself when you finish. Make sure that you do not overload your schedule.   Medicines Your health care provider may suggest certain medicines if he or she feels that they will help to improve your condition. Medicines for depression (antidepressants) or severe loss of contact with reality (antipsychotics) may be used to treat PTSD. Avoid using alcohol and other substances that may prevent your medicines from working properly. It is also important to:  Talk with  your pharmacist or health care provider about all medicines that you take, their possible side effects, and which medicines are safe to take together.  Make it your goal to take part in all treatment decisions (shared decision-making). Ask about possible side effects of medicines that your health care provider recommends, and tell him or her how you feel about having those side effects. It is best if shared decision-making with your health care provider is part of your total treatment plan. If your health care provider prescribes a medicine, you may not notice the full benefits of it for 4-8 weeks. Most people who are treated for PTSD need to take medicine for at least 6-12 months before they feel better. If you are taking medicines as part of your treatment, do not stop taking medicines before you ask your health care provider if it is safe to stop. You may need to have the medicine slowly decreased (tapered) over time to lower the risk of harmful side effects. Relationships Many people who have PTSD have difficulty trusting others. Make an effort to:  Take risks and develop trust with close friends and family members. Developing trust in others can help you feel safe and connect you with emotional support.  Be open and honest about your feelings.  Have fun and relax in safe spaces, such as with friends and family.  Think about going to couples counseling, family education classes, or family therapy. Your loved ones may not always know how to be supportive. Therapy can be helpful for everyone. How to recognize changes in your condition Be aware of   your symptoms and how often you have them. The following symptoms mean that you need to seek help for your PTSD:  You feel suspicious and angry.  You have repeated flashbacks.  You avoid going out or being with others.  You have an increasing number of fights with close friends or family members, such as your spouse.  You have thoughts about  hurting yourself or others.  You cannot get relief from feelings of depression or anxiety. Follow these instructions at home: Lifestyle  Exercise regularly. Try to do 30 or more minutes of physical activity on most days of the week.  Try to get 7-9 hours of sleep each night. To help with sleep: ? Keep your bedroom cool and dark. ? Avoid screen time before bedtime. This means avoiding use of your TV, computer, tablet, and cell phone.  Practice self-soothing skills and use them daily.  Try to have fun and seek humor in your life. Eating and drinking  Do not eat a heavy meal during the hour before you go to bed.  Do not drink alcohol or caffeinated drinks before bed.  Avoid using alcohol or drugs. General instructions  If your PTSD is affecting your marriage or family, seek help from a family therapist.  Remind yourself that recovering from the trauma is a process and takes time.  Take over-the-counter and prescription medicines only as told by your health care provider.  Make sure to let all of your health care providers know that you have PTSD. This is especially important if you are having surgery or need to be admitted to the hospital.  Keep all follow-up visits as told by your health care providers. This is important. Where to find support Talking to others  Explain that PTSD is a mental health problem. It is something that a person can develop after experiencing or seeing a life-threatening event. Tell them that PTSD makes you feel stress like you did during the event.  Talk to your loved ones about the symptoms you have. Also tell them what things or situations can cause symptoms to start (are triggers for you).  Assure your loved ones that there are treatments to help PTSD. Discuss possibly seeking family therapy or couples therapy.  If you are worried or fearful about seeking treatment, ask for support.  Keep daily contact with at least one trusted friend or family  member. Finances Not all insurance plans cover mental health care, so it is important to check with your insurance carrier. If paying for co-pays or counseling services is a problem, search for a local or county mental health care center. Public mental health care services may be offered there at a low cost or no cost when you are not able to see a private health care provider. If you are a veteran, contact a local veterans organization or veterans hospital for more information. If you are taking medicine for PTSD, you may be able to get the genericform, which may be less expensive than brand-name medicine. Some makers of prescription medicines also offer help to patients who cannot afford the medicines that they need. Therapy and support groups  Find a support group in your community. Often, groups are available for military veterans, trauma victims, and family members or caregivers.  Look into volunteer opportunities. Taking part in these can help you feel more connected to your community.  Contact a local organization to find out if you are eligible for a service dog. Where to find more information Go   to this website to find more information about PTSD, treatment of PTSD, and how to get support:  National Center for PTSD: www.ptsd.va.gov Contact a health care provider if:  Your symptoms get worse or do not get better. Get help right away if:  You have thoughts about hurting yourself or others. If you ever feel like you may hurt yourself or others, or have thoughts about taking your own life, get help right away. You can go to your nearest emergency department or call:  Your local emergency services (911 in the [place]  A suicide crisis helpline, such as the National Suicide Prevention Lifeline at 1-800-273-8255. This is open 24-hours a day. Summary  If you are living with PTSD, there are ways to help you recover from it and manage your symptoms.  Find supportive environments and  people who understand PTSD. Spend time in those places, and maintain contact with those people.  Work with your health care team to create a plan for managing PTSD. The plan should include counseling, stress reduction techniques, and healthy lifestyle habits. This information is not intended to replace advice given to you by your health care provider. Make sure you discuss any questions you have with your health care provider. Document Revised: 12/14/2019 Document Reviewed: 12/14/2019 Elsevier Patient Education  2021 Elsevier Inc.  

## 2020-09-11 NOTE — Progress Notes (Signed)
Established Patient Office Visit  Subjective:  Patient ID: Albert Hall, male    DOB: 1986/01/10  Age: 35 y.o. MRN: [id number]  CC:  Chief Complaint  Patient presents with   Follow-up    HPI Albert Hall presents for follow up visit. States that anxiety, especially related to social situations, is much better. States that he is able to take prescribed alprazolam at those times, when needed. Able tolerate them much better. lexapro also increased to 34m daily. May be contributing to improved anxiety and depression. States that he is having less panic attacks and he is overall, less irritable. He has had no negative side effects from medication. He is using alprazolam no more than what is prescribed and doing well with it. He continues to have problems with insomnia even though anxiety has improved. Patient does have PTSD which has not been effectively treated in the past. May be contributing to his insomnia.  Restarted amlodipine due to elevated blood pressure and heart rate. Both have improved since he was initially seen. He does report some mild ankle and pedal edema. He is unsure if this was present prior to starting the medication or if it is a side effect related to it. He is tolerating the medication well. He denies chest pain, chest pressure or shortness of breath from taking this medication.  The patient did have routine labs prior to this visit. He did have significant elevation of his liver functions. He states that he does drink heavily at home. His wife states taht the patient drinks a bottle of bourbon nearly every night. He states that this is improvement over recent months. Was drinking more than that. He states that drinking is happening mostly in the evenings. Starts when his wife gets home from work. Continues to drink throughout the evening and night. He states that he does not need a "eye opener" and he does not need to drink if at work. He is currently out of work since  having spinal surgery earlier this year. He denies abdominal pain. Denies nausea, vomiting, or changes in in bowel or bladder habits.     Past Medical History:  Diagnosis Date   Anxiety    Generalized anxiety disorder 01/08/2014   GERD (gastroesophageal reflux disease)    Hypertension    Insomnia 01/08/2014   Male sexual dysfunction    Tobacco abuse 02/11/2014    Past Surgical History:  Procedure Laterality Date   LUMBAR LAMINECTOMY/ DECOMPRESSION WITH MET-RX Right 07/24/2020   Procedure: Right Lumbar Four-Five Minimally Invasive Microdiscectomy with Metrx;  Surgeon: DKarsten Ro DO;  Location: [facility]  Service: Neurosurgery;  Laterality: Right;    Family History  Problem Relation Age of Onset   Alcoholism Mother    High blood pressure Mother    Asthma Father    Alcoholism Father    Depression Father    Asthma Sister    Diabetes Paternal Uncle    Cancer Neg Hx    Stroke Neg Hx    CAD Neg Hx     Social History   Socioeconomic History   Marital status: Married    Spouse name: HNira Hall  Number of children: 0   Years of education: 14   Highest education level: Not on file  Occupational History   Occupation: [employer] cyt[employer]ist Tobacco Use   Smoking status: Every Day    Packs/day: 0.25    Years: 3.00    Pack years: 0.75  Types: Cigarettes   Smokeless tobacco: Former  Substance and Sexual Activity   Alcohol use: Yes    Alcohol/week: 7.0 - 8.0 standard drinks    Types: 7 Cans of beer per week    Comment: 1 beer nightly   Drug use: No   Sexual activity: Yes    Partners: Female  Other Topics Concern   Not on file  Social History Narrative   Lives with wife and baby daughter, german shepherd and 2 cats   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   Activity: no regular exercise   Diet: good water, fruits/vegetables daily   Social Determinants of Health   Financial Resource Strain: Not on file  Food Insecurity: Not on file   Transportation Needs: Not on file  Physical Activity: Not on file  Stress: Not on file  Social Connections: Not on file  Intimate Partner Violence: Not on file    Outpatient Medications Prior to Visit  Medication Sig Dispense Refill   amLODipine (NORVASC) 5 MG tablet Take 1 tablet (5 mg total) by mouth daily. 90 tablet 1   escitalopram (LEXAPRO) 10 MG tablet Take 1.5 tablets (15 mg total) by mouth daily. 135 tablet 0   methocarbamol (ROBAXIN) 500 MG tablet Take 1 tablet (500 mg total) by mouth every 6 (six) hours as needed for muscle spasms. 90 tablet 1   omeprazole (PRILOSEC) 20 MG capsule Take 1 capsule po BID for GERD 60 capsule 1   Vitamin D, Ergocalciferol, (DRISDOL) 50000 units CAPS capsule Take 1 capsule (50,000 Units total) by mouth every 7 (seven) days. 12 capsule 3   ALPRAZolam (XANAX) 0.5 MG tablet Take 1 tablet (0.5 mg total) by mouth daily as needed for anxiety. 30 tablet 0   No facility-administered medications prior to visit.    Allergies  Allergen Reactions   Penicillins Anaphylaxis   Shrimp (Diagnostic) Hives   Trazodone And Nefazodone Other (See Comments)    Affected hearing, malaise    ROS Review of Systems  Constitutional:  Positive for fatigue. Negative for activity change, appetite change, chills and fever.  HENT:  Negative for congestion, postnasal drip, rhinorrhea, sinus pressure and sinus pain.   Eyes: Negative.   Respiratory:  Negative for cough, chest tightness, shortness of breath and wheezing.   Cardiovascular:  Positive for leg swelling. Negative for chest pain and palpitations.  Gastrointestinal:  Negative for constipation, diarrhea, nausea and vomiting.  Endocrine: Negative for cold intolerance, heat intolerance, polydipsia and polyuria.  Musculoskeletal:  Positive for back pain. Negative for myalgias.       Chronic back pain since having spinal surgery. No longer taking narcotic pain medication. Does take muscle relaxer and continues to see  neurosurgeon.   Skin:  Negative for rash.  Neurological:  Negative for dizziness, weakness and headaches.  Psychiatric/Behavioral:  Positive for dysphoric mood and sleep disturbance. The patient is nervous/anxious.      Objective:    Physical Exam Vitals and nursing note reviewed.  Constitutional:      Appearance: Normal appearance. He is well-developed. He is obese.  HENT:     Head: Normocephalic and atraumatic.     Nose: Nose normal.     Mouth/Throat:     Mouth: Mucous membranes are moist.  Eyes:     Extraocular Movements: Extraocular movements intact.     Conjunctiva/sclera: Conjunctivae normal.     Pupils: Pupils are equal, round, and reactive to light.  Cardiovascular:     Rate and  Rhythm: Normal rate and regular rhythm.     Pulses: Normal pulses.     Heart sounds: Normal heart sounds.  Pulmonary:     Effort: Pulmonary effort is normal.     Breath sounds: Normal breath sounds.  Abdominal:     General: Bowel sounds are decreased. There is distension.     Palpations: Abdomen is soft. There is no hepatomegaly, splenomegaly, mass or pulsatile mass.     Tenderness: There is no abdominal tenderness.  Musculoskeletal:        General: Normal range of motion.     Cervical back: Normal range of motion and neck supple.  Lymphadenopathy:     Cervical: No cervical adenopathy.  Skin:    General: Skin is warm and dry.     Capillary Refill: Capillary refill takes less than 2 seconds.  Neurological:     General: No focal deficit present.     Mental Status: He is alert and oriented to person, place, and time. Mental status is at baseline.  Psychiatric:        Attention and Perception: Attention and perception normal.        Mood and Affect: Mood is anxious and depressed.        Speech: Speech normal.        Behavior: Behavior normal. Behavior is cooperative.        Thought Content: Thought content normal.        Cognition and Memory: Cognition and memory normal.        Judgment:  Judgment normal.    Today's Vitals   09/11/20 1445  BP: 126/78  Pulse: 98  Temp: 99.3 F (37.4 C)  SpO2: 96%  Weight: 266 lb 6.4 oz (120.8 kg)  Height: 6' 1"  (1.854 m)   Body mass index is 35.15 kg/m.   Wt Readings from Last 3 Encounters:  09/11/20 266 lb 6.4 oz (120.8 kg)  08/14/20 268 lb 9.6 oz (121.8 kg)  07/24/20 280 lb (127 kg)     Health Maintenance Due  Topic Date Due   COVID-19 Vaccine (1) Never done   Pneumococcal Vaccine 32-57 Years old (1 - PCV) Never done   TETANUS/TDAP  04/12/2018    There are no preventive care reminders to display for this patient.  Lab Results  Component Value Date   TSH 2.540 09/04/2020   Lab Results  Component Value Date   WBC 11.4 (H) 09/04/2020   HGB 12.5 (L) 09/04/2020   HCT 36.1 (L) 09/04/2020   MCV 96 09/04/2020   PLT 154 09/04/2020   Lab Results  Component Value Date   NA 139 09/17/2020   K 2.9 (L) 09/17/2020   CO2 25 09/17/2020   GLUCOSE 117 (H) 09/17/2020   BUN 6 09/17/2020   CREATININE 0.58 (L) 09/17/2020   BILITOT 4.7 (H) 09/17/2020   ALKPHOS 330 (H) 09/17/2020   AST 187 (H) 09/17/2020   ALT 94 (H) 09/17/2020   PROT 7.0 09/17/2020   ALBUMIN 3.4 (L) 09/17/2020   CALCIUM 8.6 (L) 09/17/2020   ANIONGAP 10 07/24/2020   EGFR 131 09/17/2020   Lab Results  Component Value Date   CHOL 223 (H) 09/04/2020   Lab Results  Component Value Date   HDL 27 (L) 09/04/2020   Lab Results  Component Value Date   LDLCALC 169 (H) 09/04/2020   Lab Results  Component Value Date   TRIG 146 09/04/2020   Lab Results  Component Value Date   CHOLHDL 8.3 (H)  09/04/2020   Lab Results  Component Value Date   HGBA1C 5.4 09/04/2020      Assessment & Plan:  1. Essential hypertension Blood pressure currently stable. No pedal edema is noted at this time. Will continue amlodipine at current dose.   2. Psychophysiological insomnia Add prazosin 58m at bedtime to help with insomnia which is likely due to PTSD. Will reassess  at next visit. Patient reluctant to see psychiatry at this time.  - prazosin (MINIPRESS) 1 MG capsule; Take 1 capsule (1 mg total) by mouth at bedtime.  Dispense: 30 capsule; Refill: 1  3. Post traumatic stress disorder Add prazosin 186mat bedtime to help with insomnia which is likely due to PTSD. Will reassess at next visit. Patient reluctant to see psychiatry at this time. May continue alprazolam 0.57m57mD as needed for acute anxiety.  - prazosin (MINIPRESS) 1 MG capsule; Take 1 capsule (1 mg total) by mouth at bedtime.  Dispense: 30 capsule; Refill: 1 - ALPRAZolam (XANAX) 0.5 MG tablet; Take 1 tablet (0.5 mg total) by mouth daily as needed for anxiety.  Dispense: 30 tablet; Refill: 0  4. Elevated liver function tests Severe elevation of liver functions. Patient admitting to moderate to significant alcohol use. Will recheck CMP and will add acute hepatitis panel and will get abdominal ultrasound for further evaluation. Refer to GI as indicated.   5. Alcohol abuse Will check [id number] acid, thiamin, and vitamin D level for further evaluation. Long discussion with patient and his wife about gradually cutting back alcohol intake. May consider treatment with naloxone in the future.   6. Other ascites Recheck liver functions along with acute hepatitis panel. Will get abdominal ultrasound for further evaluation.    Problem List Items Addressed This Visit       Cardiovascular and Mediastinum   Essential hypertension - Primary   Relevant Medications   prazosin (MINIPRESS) 1 MG capsule     Other   H/o Insomnia (Chronic)   Relevant Medications   prazosin (MINIPRESS) 1 MG capsule   Emotional crisis, acute reaction to stress (Chronic)   Relevant Medications   ALPRAZolam (XANAX) 0.5 MG tablet   Alcohol abuse   Elevated liver function tests   Relevant Orders   US [place] Complete   Other ascites   Relevant Orders   US [place] Complete    Meds ordered this encounter  Medications    prazosin (MINIPRESS) 1 MG capsule    Sig: Take 1 capsule (1 mg total) by mouth at bedtime.    Dispense:  30 capsule    Refill:  1    Order Specific Question:   Supervising Provider    Answer:   METBeatrice Lecher[[id number]]   ALPRAZolam (XANAX) 0.5 MG tablet    Sig: Take 1 tablet (0.5 mg total) by mouth daily as needed for anxiety.    Dispense:  30 tablet    Refill:  0    Order Specific Question:   Supervising Provider    Answer:   METBeatrice Lecher[[id number]]    Follow-up: Return in about 3 weeks (around 10/02/2020) for results - needs labs tomorrow - b12, folate, thiamin, acute hepatitis panel, vitamin d, cmp.    HeaRonnell FreshwaterP

## 2020-09-12 ENCOUNTER — Other Ambulatory Visit: Payer: Self-pay | Admitting: Nurse Practitioner

## 2020-09-12 ENCOUNTER — Other Ambulatory Visit: Payer: BC Managed Care – PPO

## 2020-09-12 DIAGNOSIS — F1010 Alcohol abuse, uncomplicated: Secondary | ICD-10-CM

## 2020-09-12 DIAGNOSIS — R7989 Other specified abnormal findings of blood chemistry: Secondary | ICD-10-CM | POA: Insufficient documentation

## 2020-09-12 DIAGNOSIS — I10 Essential (primary) hypertension: Secondary | ICD-10-CM

## 2020-09-12 DIAGNOSIS — R188 Other ascites: Secondary | ICD-10-CM | POA: Insufficient documentation

## 2020-09-12 DIAGNOSIS — F430 Acute stress reaction: Secondary | ICD-10-CM

## 2020-09-12 DIAGNOSIS — Z0000 Encounter for general adult medical examination without abnormal findings: Secondary | ICD-10-CM

## 2020-09-12 DIAGNOSIS — F5104 Psychophysiologic insomnia: Secondary | ICD-10-CM

## 2020-09-15 NOTE — Progress Notes (Signed)
Reviewed with patient and his wife. Additional blood work and imaging studies were ordered during visit.

## 2020-09-16 ENCOUNTER — Other Ambulatory Visit: Payer: Self-pay

## 2020-09-16 DIAGNOSIS — F1010 Alcohol abuse, uncomplicated: Secondary | ICD-10-CM

## 2020-09-16 DIAGNOSIS — R188 Other ascites: Secondary | ICD-10-CM

## 2020-09-17 ENCOUNTER — Other Ambulatory Visit: Payer: Self-pay

## 2020-09-17 ENCOUNTER — Other Ambulatory Visit: Payer: BC Managed Care – PPO

## 2020-09-17 DIAGNOSIS — F5104 Psychophysiologic insomnia: Secondary | ICD-10-CM

## 2020-09-17 DIAGNOSIS — Z0000 Encounter for general adult medical examination without abnormal findings: Secondary | ICD-10-CM

## 2020-09-17 DIAGNOSIS — I10 Essential (primary) hypertension: Secondary | ICD-10-CM | POA: Diagnosis not present

## 2020-09-17 DIAGNOSIS — F1010 Alcohol abuse, uncomplicated: Secondary | ICD-10-CM

## 2020-09-17 DIAGNOSIS — F430 Acute stress reaction: Secondary | ICD-10-CM

## 2020-09-17 DIAGNOSIS — R7989 Other specified abnormal findings of blood chemistry: Secondary | ICD-10-CM | POA: Diagnosis not present

## 2020-09-19 DIAGNOSIS — M6281 Muscle weakness (generalized): Secondary | ICD-10-CM | POA: Diagnosis not present

## 2020-09-19 DIAGNOSIS — M5416 Radiculopathy, lumbar region: Secondary | ICD-10-CM | POA: Diagnosis not present

## 2020-09-21 NOTE — Progress Notes (Signed)
Will need treatment for low potassium, vitamin d, and folic acid deficiency. Negative acute hepatitis panel. Waiting on thiamine level. Getting u/s 6/20. Has follow up 6/23 to review labs and ultrasound results.

## 2020-09-22 LAB — B12 AND FOLATE PANEL
Folate: 2.6 ng/mL — ABNORMAL LOW (ref >3.0)
Vitamin B-12: 1560 pg/mL — ABNORMAL HIGH (ref 232–1245)

## 2020-09-22 LAB — ACUTE VIRAL HEPATITIS (HAV, HBV, HCV)
HCV Ab: <0.1 s/co ratio (ref 0.0–0.9)
Hep A IgM: NEGATIVE
Hep B C IgM: NEGATIVE
Hepatitis B Surface Ag: NEGATIVE

## 2020-09-22 LAB — COMPREHENSIVE METABOLIC PANEL
ALT: 94 IU/L — ABNORMAL HIGH (ref 0–44)
AST: 187 IU/L — ABNORMAL HIGH (ref 0–40)
Albumin/Globulin Ratio: 0.9 — ABNORMAL LOW (ref 1.2–2.2)
Albumin: 3.4 g/dL — ABNORMAL LOW (ref 4.0–5.0)
Alkaline Phosphatase: 330 IU/L — ABNORMAL HIGH (ref 44–121)
BUN/Creatinine Ratio: 10 (ref 9–20)
BUN: 6 mg/dL (ref 6–20)
Bilirubin Total: 4.7 mg/dL — ABNORMAL HIGH (ref 0.0–1.2)
CO2: 25 mmol/L (ref 20–29)
Calcium: 8.6 mg/dL — ABNORMAL LOW (ref 8.7–10.2)
Chloride: 95 mmol/L — ABNORMAL LOW (ref 96–106)
Creatinine, Ser: 0.58 mg/dL — ABNORMAL LOW (ref 0.76–1.27)
Globulin, Total: 3.6 g/dL (ref 1.5–4.5)
Glucose: 117 mg/dL — ABNORMAL HIGH (ref 65–99)
Potassium: 2.9 mmol/L — ABNORMAL LOW (ref 3.5–5.2)
Sodium: 139 mmol/L (ref 134–144)
Total Protein: 7 g/dL (ref 6.0–8.5)
eGFR: 131 mL/min/{1.73_m2} (ref >59)

## 2020-09-22 LAB — HCV INTERPRETATION

## 2020-09-22 LAB — VITAMIN D 25 HYDROXY (VIT D DEFICIENCY, FRACTURES): Vit D, 25-Hydroxy: 11.1 ng/mL — ABNORMAL LOW (ref 30.0–100.0)

## 2020-09-22 LAB — VITAMIN B1: Thiamine: 84.9 nmol/L (ref 66.5–200.0)

## 2020-09-23 ENCOUNTER — Inpatient Hospital Stay (HOSPITAL_COMMUNITY)
Admission: EM | Admit: 2020-09-23 | Discharge: 2020-09-28 | DRG: 372 | Disposition: A | Discharge status: Home / Self Care | Payer: BC Managed Care – PPO | Attending: Family Medicine | Admitting: Family Medicine

## 2020-09-23 ENCOUNTER — Encounter (HOSPITAL_COMMUNITY): Payer: Self-pay

## 2020-09-23 ENCOUNTER — Telehealth: Payer: Self-pay | Admitting: Nurse Practitioner

## 2020-09-23 ENCOUNTER — Other Ambulatory Visit: Payer: Self-pay

## 2020-09-23 ENCOUNTER — Emergency Department (HOSPITAL_COMMUNITY): Payer: BC Managed Care – PPO

## 2020-09-23 DIAGNOSIS — K3189 Other diseases of stomach and duodenum: Secondary | ICD-10-CM | POA: Diagnosis present

## 2020-09-23 DIAGNOSIS — K219 Gastro-esophageal reflux disease without esophagitis: Secondary | ICD-10-CM | POA: Diagnosis present

## 2020-09-23 DIAGNOSIS — R1110 Vomiting, unspecified: Secondary | ICD-10-CM | POA: Diagnosis not present

## 2020-09-23 DIAGNOSIS — F411 Generalized anxiety disorder: Secondary | ICD-10-CM | POA: Diagnosis not present

## 2020-09-23 DIAGNOSIS — Z7982 Long term (current) use of aspirin: Secondary | ICD-10-CM

## 2020-09-23 DIAGNOSIS — Z20822 Contact with and (suspected) exposure to covid-19: Secondary | ICD-10-CM | POA: Diagnosis not present

## 2020-09-23 DIAGNOSIS — E876 Hypokalemia: Secondary | ICD-10-CM | POA: Diagnosis not present

## 2020-09-23 DIAGNOSIS — R000 Tachycardia, unspecified: Secondary | ICD-10-CM | POA: Diagnosis not present

## 2020-09-23 DIAGNOSIS — K7031 Alcoholic cirrhosis of liver with ascites: Secondary | ICD-10-CM | POA: Diagnosis present

## 2020-09-23 DIAGNOSIS — Z818 Family history of other mental and behavioral disorders: Secondary | ICD-10-CM | POA: Diagnosis not present

## 2020-09-23 DIAGNOSIS — R1310 Dysphagia, unspecified: Secondary | ICD-10-CM | POA: Diagnosis not present

## 2020-09-23 DIAGNOSIS — K760 Fatty (change of) liver, not elsewhere classified: Secondary | ICD-10-CM | POA: Diagnosis not present

## 2020-09-23 DIAGNOSIS — F1010 Alcohol abuse, uncomplicated: Secondary | ICD-10-CM | POA: Diagnosis not present

## 2020-09-23 DIAGNOSIS — K2970 Gastritis, unspecified, without bleeding: Secondary | ICD-10-CM | POA: Diagnosis not present

## 2020-09-23 DIAGNOSIS — Z79899 Other long term (current) drug therapy: Secondary | ICD-10-CM | POA: Diagnosis not present

## 2020-09-23 DIAGNOSIS — K766 Portal hypertension: Secondary | ICD-10-CM | POA: Diagnosis present

## 2020-09-23 DIAGNOSIS — Z880 Allergy status to penicillin: Secondary | ICD-10-CM

## 2020-09-23 DIAGNOSIS — K7011 Alcoholic hepatitis with ascites: Secondary | ICD-10-CM | POA: Diagnosis not present

## 2020-09-23 DIAGNOSIS — R188 Other ascites: Secondary | ICD-10-CM | POA: Diagnosis not present

## 2020-09-23 DIAGNOSIS — K7010 Alcoholic hepatitis without ascites: Secondary | ICD-10-CM | POA: Diagnosis not present

## 2020-09-23 DIAGNOSIS — R112 Nausea with vomiting, unspecified: Secondary | ICD-10-CM | POA: Diagnosis not present

## 2020-09-23 DIAGNOSIS — Z888 Allergy status to other drugs, medicaments and biological substances status: Secondary | ICD-10-CM | POA: Diagnosis not present

## 2020-09-23 DIAGNOSIS — I10 Essential (primary) hypertension: Secondary | ICD-10-CM | POA: Diagnosis present

## 2020-09-23 DIAGNOSIS — R0781 Pleurodynia: Secondary | ICD-10-CM | POA: Diagnosis not present

## 2020-09-23 DIAGNOSIS — I8510 Secondary esophageal varices without bleeding: Secondary | ICD-10-CM | POA: Diagnosis present

## 2020-09-23 DIAGNOSIS — R1084 Generalized abdominal pain: Secondary | ICD-10-CM | POA: Diagnosis not present

## 2020-09-23 DIAGNOSIS — K652 Spontaneous bacterial peritonitis: Principal | ICD-10-CM | POA: Diagnosis present

## 2020-09-23 DIAGNOSIS — R140 Abdominal distension (gaseous): Secondary | ICD-10-CM | POA: Diagnosis not present

## 2020-09-23 DIAGNOSIS — Z6835 Body mass index (BMI) 35.0-35.9, adult: Secondary | ICD-10-CM | POA: Diagnosis not present

## 2020-09-23 DIAGNOSIS — E669 Obesity, unspecified: Secondary | ICD-10-CM | POA: Diagnosis present

## 2020-09-23 DIAGNOSIS — G4700 Insomnia, unspecified: Secondary | ICD-10-CM | POA: Diagnosis present

## 2020-09-23 DIAGNOSIS — F17210 Nicotine dependence, cigarettes, uncomplicated: Secondary | ICD-10-CM | POA: Diagnosis not present

## 2020-09-23 DIAGNOSIS — K6389 Other specified diseases of intestine: Secondary | ICD-10-CM | POA: Diagnosis present

## 2020-09-23 DIAGNOSIS — Z91013 Allergy to seafood: Secondary | ICD-10-CM

## 2020-09-23 DIAGNOSIS — Z811 Family history of alcohol abuse and dependence: Secondary | ICD-10-CM

## 2020-09-23 DIAGNOSIS — E559 Vitamin D deficiency, unspecified: Secondary | ICD-10-CM | POA: Diagnosis not present

## 2020-09-23 DIAGNOSIS — R109 Unspecified abdominal pain: Secondary | ICD-10-CM | POA: Diagnosis not present

## 2020-09-23 LAB — COMPREHENSIVE METABOLIC PANEL
ALT: 68 U/L — ABNORMAL HIGH (ref 0–44)
AST: 126 U/L — ABNORMAL HIGH (ref 15–41)
Albumin: 2.7 g/dL — ABNORMAL LOW (ref 3.5–5.0)
Alkaline Phosphatase: 212 U/L — ABNORMAL HIGH (ref 38–126)
Anion gap: 14 (ref 5–15)
BUN: <5 mg/dL — ABNORMAL LOW (ref 6–20)
CO2: 26 mmol/L (ref 22–32)
Calcium: 8.9 mg/dL (ref 8.9–10.3)
Chloride: 93 mmol/L — ABNORMAL LOW (ref 98–111)
Creatinine, Ser: 0.54 mg/dL — ABNORMAL LOW (ref 0.61–1.24)
GFR, Estimated: >60 mL/min (ref >60)
Glucose, Bld: 105 mg/dL — ABNORMAL HIGH (ref 70–99)
Potassium: 2.9 mmol/L — ABNORMAL LOW (ref 3.5–5.1)
Sodium: 133 mmol/L — ABNORMAL LOW (ref 135–145)
Total Bilirubin: 5.7 mg/dL — ABNORMAL HIGH (ref 0.3–1.2)
Total Protein: 7.3 g/dL (ref 6.5–8.1)

## 2020-09-23 LAB — PROTIME-INR
INR: 1.3 — ABNORMAL HIGH (ref 0.8–1.2)
Prothrombin Time: 15.7 seconds — ABNORMAL HIGH (ref 11.4–15.2)

## 2020-09-23 LAB — CBC WITH DIFFERENTIAL/PLATELET
Abs Immature Granulocytes: 0.18 10*3/uL — ABNORMAL HIGH (ref 0.00–0.07)
Basophils Absolute: 0.1 10*3/uL (ref 0.0–0.1)
Basophils Relative: 1 %
Eosinophils Absolute: 0.1 10*3/uL (ref 0.0–0.5)
Eosinophils Relative: 1 %
HCT: 36.6 % — ABNORMAL LOW (ref 39.0–52.0)
Hemoglobin: 12.3 g/dL — ABNORMAL LOW (ref 13.0–17.0)
Immature Granulocytes: 1 %
Lymphocytes Relative: 9 %
Lymphs Abs: 1.3 10*3/uL (ref 0.7–4.0)
MCH: 33.2 pg (ref 26.0–34.0)
MCHC: 33.6 g/dL (ref 30.0–36.0)
MCV: 98.7 fL (ref 80.0–100.0)
Monocytes Absolute: 1.1 10*3/uL — ABNORMAL HIGH (ref 0.1–1.0)
Monocytes Relative: 8 %
Neutro Abs: 11.4 10*3/uL — ABNORMAL HIGH (ref 1.7–7.7)
Neutrophils Relative %: 80 %
Platelets: 229 10*3/uL (ref 150–400)
RBC: 3.71 MIL/uL — ABNORMAL LOW (ref 4.22–5.81)
RDW: 17.1 % — ABNORMAL HIGH (ref 11.5–15.5)
WBC: 14.1 10*3/uL — ABNORMAL HIGH (ref 4.0–10.5)
nRBC: 0 % (ref 0.0–0.2)

## 2020-09-23 LAB — LIPASE, BLOOD: Lipase: 23 U/L (ref 11–51)

## 2020-09-23 MED ORDER — IOHEXOL 300 MG/ML  SOLN
100.0000 mL | Freq: Once | INTRAMUSCULAR | Status: AC | PRN
  Administered 2020-09-23: 100 mL via INTRAVENOUS

## 2020-09-23 NOTE — Telephone Encounter (Signed)
Patients wife states patient is in extreme pain with nausea, vomiting and diarrhea. The location of the pain is described as being URQ. Patient states the pain is a level 8 right now and has "eased off" some.   Advised patient to go to ED for evaluation and treatment. Pt verbalized understanding and was agreeable.

## 2020-09-23 NOTE — ED Provider Notes (Signed)
Emergency Medicine Provider Triage Evaluation Note  Albert Hall , a 35 y.o. male  was evaluated in triage.  Pt complains of abdominal pain, vomiting and diarrhea.  Patient reports for the past 2 months he has been having pain across his upper abdomen but over the past 2 days this pain has been getting worse and he has now had associated vomiting.  Was not able to eat or drink yesterday.  On chart review it appears patient is being evaluated for alcoholic liver disease.  Does report drinking 2 cups of liquor typically on a daily basis, last drink at 10 PM last night.  Denies any blood in emesis or stool.  PCP was trying to send patient for abdominal ultrasound..  Review of Systems  Positive: Abdominal, vomiting and diarrhea Negative: Fevers, hematemesis, blood in stool  Physical Exam  BP (!) 146/76   Pulse (!) 117   Temp 98.8 F (37.1 C) (Oral)   Resp 18   SpO2 94%  Gen:   Awake, no distress   Resp:  Normal effort  MSK:   Moves extremities without difficulty  Other:  Abdomen mildly distended with tenderness across the upper abdomen, slight scleral icterus noted.  Medical Decision Making  Medically screening exam initiated at 11:43 AM.  Appropriate orders placed.  Albert Hall was informed that the remainder of the evaluation will be completed by another provider, this initial triage assessment does not replace that evaluation, and the importance of remaining in the ED until their evaluation is complete.     Dartha Lodge, PA-C 09/23/20 1155    Gerhard Munch, MD 09/24/20 [phone]

## 2020-09-23 NOTE — ED Notes (Signed)
Pt stepped outside. Pt wearing blue shirt!!

## 2020-09-23 NOTE — ED Triage Notes (Signed)
Presents with upper abd pain x2 months. Worsening pain, unable to eat yesterday, vomiting and diarrhea. Daily drinker being evaluated for liver problems.Marland Kitchen last drank 10pm last night. Drinks 2 whiskey drinks a day

## 2020-09-23 NOTE — Telephone Encounter (Signed)
Patient is experiencing abdominal pain, diarrhea, and vomiting, Please advise.

## 2020-09-23 NOTE — ED Notes (Signed)
Pt step outside 

## 2020-09-24 ENCOUNTER — Emergency Department (HOSPITAL_COMMUNITY): Payer: BC Managed Care – PPO

## 2020-09-24 ENCOUNTER — Inpatient Hospital Stay (HOSPITAL_COMMUNITY): Payer: BC Managed Care – PPO

## 2020-09-24 DIAGNOSIS — Z880 Allergy status to penicillin: Secondary | ICD-10-CM | POA: Diagnosis not present

## 2020-09-24 DIAGNOSIS — R1310 Dysphagia, unspecified: Secondary | ICD-10-CM | POA: Diagnosis present

## 2020-09-24 DIAGNOSIS — E876 Hypokalemia: Secondary | ICD-10-CM | POA: Diagnosis not present

## 2020-09-24 DIAGNOSIS — F1010 Alcohol abuse, uncomplicated: Secondary | ICD-10-CM | POA: Diagnosis present

## 2020-09-24 DIAGNOSIS — R1084 Generalized abdominal pain: Secondary | ICD-10-CM | POA: Diagnosis present

## 2020-09-24 DIAGNOSIS — Z91013 Allergy to seafood: Secondary | ICD-10-CM | POA: Diagnosis not present

## 2020-09-24 DIAGNOSIS — K652 Spontaneous bacterial peritonitis: Principal | ICD-10-CM

## 2020-09-24 DIAGNOSIS — K3189 Other diseases of stomach and duodenum: Secondary | ICD-10-CM | POA: Diagnosis present

## 2020-09-24 DIAGNOSIS — Z7982 Long term (current) use of aspirin: Secondary | ICD-10-CM | POA: Diagnosis not present

## 2020-09-24 DIAGNOSIS — R112 Nausea with vomiting, unspecified: Secondary | ICD-10-CM

## 2020-09-24 DIAGNOSIS — K766 Portal hypertension: Secondary | ICD-10-CM | POA: Diagnosis present

## 2020-09-24 DIAGNOSIS — Z20822 Contact with and (suspected) exposure to covid-19: Secondary | ICD-10-CM | POA: Diagnosis present

## 2020-09-24 DIAGNOSIS — I10 Essential (primary) hypertension: Secondary | ICD-10-CM

## 2020-09-24 DIAGNOSIS — Z79899 Other long term (current) drug therapy: Secondary | ICD-10-CM | POA: Diagnosis not present

## 2020-09-24 DIAGNOSIS — K7031 Alcoholic cirrhosis of liver with ascites: Secondary | ICD-10-CM

## 2020-09-24 DIAGNOSIS — Z811 Family history of alcohol abuse and dependence: Secondary | ICD-10-CM | POA: Diagnosis not present

## 2020-09-24 DIAGNOSIS — Z818 Family history of other mental and behavioral disorders: Secondary | ICD-10-CM | POA: Diagnosis not present

## 2020-09-24 DIAGNOSIS — F17210 Nicotine dependence, cigarettes, uncomplicated: Secondary | ICD-10-CM | POA: Diagnosis present

## 2020-09-24 DIAGNOSIS — F411 Generalized anxiety disorder: Secondary | ICD-10-CM | POA: Diagnosis present

## 2020-09-24 DIAGNOSIS — K6389 Other specified diseases of intestine: Secondary | ICD-10-CM | POA: Diagnosis present

## 2020-09-24 DIAGNOSIS — I8510 Secondary esophageal varices without bleeding: Secondary | ICD-10-CM | POA: Diagnosis present

## 2020-09-24 DIAGNOSIS — Z888 Allergy status to other drugs, medicaments and biological substances status: Secondary | ICD-10-CM | POA: Diagnosis not present

## 2020-09-24 DIAGNOSIS — Z6835 Body mass index (BMI) 35.0-35.9, adult: Secondary | ICD-10-CM | POA: Diagnosis not present

## 2020-09-24 DIAGNOSIS — K219 Gastro-esophageal reflux disease without esophagitis: Secondary | ICD-10-CM | POA: Diagnosis present

## 2020-09-24 DIAGNOSIS — K7011 Alcoholic hepatitis with ascites: Secondary | ICD-10-CM | POA: Diagnosis present

## 2020-09-24 DIAGNOSIS — K7010 Alcoholic hepatitis without ascites: Secondary | ICD-10-CM | POA: Diagnosis not present

## 2020-09-24 DIAGNOSIS — G4700 Insomnia, unspecified: Secondary | ICD-10-CM | POA: Diagnosis present

## 2020-09-24 LAB — COMPREHENSIVE METABOLIC PANEL
ALT: 58 U/L — ABNORMAL HIGH (ref 0–44)
AST: 99 U/L — ABNORMAL HIGH (ref 15–41)
Albumin: 2.5 g/dL — ABNORMAL LOW (ref 3.5–5.0)
Alkaline Phosphatase: 169 U/L — ABNORMAL HIGH (ref 38–126)
Anion gap: 15 (ref 5–15)
BUN: 8 mg/dL (ref 6–20)
CO2: 26 mmol/L (ref 22–32)
Calcium: 8.4 mg/dL — ABNORMAL LOW (ref 8.9–10.3)
Chloride: 92 mmol/L — ABNORMAL LOW (ref 98–111)
Creatinine, Ser: 0.52 mg/dL — ABNORMAL LOW (ref 0.61–1.24)
GFR, Estimated: >60 mL/min (ref >60)
Glucose, Bld: 89 mg/dL (ref 70–99)
Potassium: 3 mmol/L — ABNORMAL LOW (ref 3.5–5.1)
Sodium: 133 mmol/L — ABNORMAL LOW (ref 135–145)
Total Bilirubin: 6.7 mg/dL — ABNORMAL HIGH (ref 0.3–1.2)
Total Protein: 7.1 g/dL (ref 6.5–8.1)

## 2020-09-24 LAB — RESP PANEL BY RT-PCR (FLU A&B, COVID) ARPGX2
Influenza A by PCR: NEGATIVE
Influenza B by PCR: NEGATIVE
SARS Coronavirus 2 by RT PCR: NEGATIVE

## 2020-09-24 LAB — URINALYSIS, ROUTINE W REFLEX MICROSCOPIC
Glucose, UA: NEGATIVE mg/dL
Hgb urine dipstick: NEGATIVE
Ketones, ur: NEGATIVE mg/dL
Leukocytes,Ua: NEGATIVE
Nitrite: NEGATIVE
Protein, ur: NEGATIVE mg/dL
Specific Gravity, Urine: 1.016 (ref 1.005–1.030)
pH: 6 (ref 5.0–8.0)

## 2020-09-24 LAB — CBC
HCT: 32.8 % — ABNORMAL LOW (ref 39.0–52.0)
Hemoglobin: 11.1 g/dL — ABNORMAL LOW (ref 13.0–17.0)
MCH: 33.8 pg (ref 26.0–34.0)
MCHC: 33.8 g/dL (ref 30.0–36.0)
MCV: 100 fL (ref 80.0–100.0)
Platelets: 203 10*3/uL (ref 150–400)
RBC: 3.28 MIL/uL — ABNORMAL LOW (ref 4.22–5.81)
RDW: 17.2 % — ABNORMAL HIGH (ref 11.5–15.5)
WBC: 15.2 10*3/uL — ABNORMAL HIGH (ref 4.0–10.5)
nRBC: 0 % (ref 0.0–0.2)

## 2020-09-24 LAB — HIV ANTIBODY (ROUTINE TESTING W REFLEX): HIV Screen 4th Generation wRfx: NONREACTIVE

## 2020-09-24 LAB — PHOSPHORUS: Phosphorus: 4.8 mg/dL — ABNORMAL HIGH (ref 2.5–4.6)

## 2020-09-24 LAB — MAGNESIUM: Magnesium: 1.8 mg/dL (ref 1.7–2.4)

## 2020-09-24 MED ORDER — ESCITALOPRAM OXALATE 10 MG PO TABS
15.0000 mg | ORAL_TABLET | Freq: Every day | ORAL | Status: DC
  Administered 2020-09-24 – 2020-09-28 (×5): 15 mg via ORAL
  Filled 2020-09-24 (×5): qty 2

## 2020-09-24 MED ORDER — ONDANSETRON HCL 4 MG PO TABS: 4.0000 mg | ORAL_TABLET | Freq: Four times a day (QID) | ORAL | Status: DC | PRN

## 2020-09-24 MED ORDER — FOLIC ACID 1 MG PO TABS
1.0000 mg | ORAL_TABLET | Freq: Every day | ORAL | Status: DC
  Administered 2020-09-24 – 2020-09-28 (×5): 1 mg via ORAL
  Filled 2020-09-24 (×5): qty 1

## 2020-09-24 MED ORDER — LORAZEPAM 2 MG/ML IJ SOLN
0.0000 mg | Freq: Four times a day (QID) | INTRAMUSCULAR | Status: DC
Start: 2020-09-24 — End: 2020-09-24

## 2020-09-24 MED ORDER — POTASSIUM CHLORIDE 10 MEQ/100ML IV SOLN
10.0000 meq | INTRAVENOUS | Status: AC
  Administered 2020-09-24 (×3): 10 meq via INTRAVENOUS
  Filled 2020-09-24 (×3): qty 100

## 2020-09-24 MED ORDER — AMLODIPINE BESYLATE 5 MG PO TABS
5.0000 mg | ORAL_TABLET | Freq: Every day | ORAL | Status: DC
Start: 2020-09-24 — End: 2020-09-24

## 2020-09-24 MED ORDER — METHOCARBAMOL 500 MG PO TABS
500.0000 mg | ORAL_TABLET | Freq: Four times a day (QID) | ORAL | Status: DC | PRN
  Administered 2020-09-24 – 2020-09-26 (×2): 500 mg via ORAL
  Filled 2020-09-24 (×2): qty 1

## 2020-09-24 MED ORDER — LORAZEPAM 2 MG/ML IJ SOLN
1.0000 mg | INTRAMUSCULAR | Status: AC | PRN
  Administered 2020-09-24: 2 mg via INTRAVENOUS
  Administered 2020-09-24 – 2020-09-25 (×2): 1 mg via INTRAVENOUS
  Filled 2020-09-24 (×3): qty 1

## 2020-09-24 MED ORDER — ONDANSETRON HCL 4 MG/2ML IJ SOLN
4.0000 mg | Freq: Once | INTRAMUSCULAR | Status: AC
  Administered 2020-09-24: 4 mg via INTRAVENOUS
  Filled 2020-09-24: qty 2

## 2020-09-24 MED ORDER — SODIUM CHLORIDE 0.9 % IV SOLN
1.0000 g | Freq: Three times a day (TID) | INTRAVENOUS | Status: DC
  Filled 2020-09-24: qty 1

## 2020-09-24 MED ORDER — MORPHINE SULFATE (PF) 2 MG/ML IV SOLN
2.0000 mg | INTRAVENOUS | Status: DC | PRN
Start: 2020-09-24 — End: 2020-09-28
  Administered 2020-09-24: 4 mg via INTRAVENOUS
  Administered 2020-09-25: 2 mg via INTRAVENOUS
  Filled 2020-09-24: qty 1
  Filled 2020-09-24: qty 2

## 2020-09-24 MED ORDER — LORAZEPAM 2 MG/ML IJ SOLN: 0.0000 mg | Freq: Two times a day (BID) | INTRAMUSCULAR | Status: DC

## 2020-09-24 MED ORDER — POTASSIUM CHLORIDE 10 MEQ/100ML IV SOLN
10.0000 meq | INTRAVENOUS | Status: AC
  Administered 2020-09-24 (×4): 10 meq via INTRAVENOUS
  Filled 2020-09-24 (×4): qty 100

## 2020-09-24 MED ORDER — ONDANSETRON HCL 4 MG/2ML IJ SOLN
4.0000 mg | Freq: Four times a day (QID) | INTRAMUSCULAR | Status: DC | PRN
  Filled 2020-09-24: qty 2

## 2020-09-24 MED ORDER — PANTOPRAZOLE SODIUM 40 MG PO TBEC
40.0000 mg | DELAYED_RELEASE_TABLET | Freq: Every day | ORAL | Status: DC
  Administered 2020-09-24 – 2020-09-28 (×5): 40 mg via ORAL
  Filled 2020-09-24 (×5): qty 1

## 2020-09-24 MED ORDER — LORAZEPAM 1 MG PO TABS
1.0000 mg | ORAL_TABLET | ORAL | Status: AC | PRN
  Administered 2020-09-24: 2 mg via ORAL
  Administered 2020-09-26: 1 mg via ORAL
  Filled 2020-09-24: qty 2
  Filled 2020-09-24: qty 1

## 2020-09-24 MED ORDER — THIAMINE HCL 100 MG PO TABS
100.0000 mg | ORAL_TABLET | Freq: Every day | ORAL | Status: DC
  Administered 2020-09-24 – 2020-09-28 (×5): 100 mg via ORAL
  Filled 2020-09-24 (×5): qty 1

## 2020-09-24 MED ORDER — LORAZEPAM 1 MG PO TABS
0.0000 mg | ORAL_TABLET | Freq: Two times a day (BID) | ORAL | Status: DC
Start: 2020-09-26 — End: 2020-09-24

## 2020-09-24 MED ORDER — SODIUM CHLORIDE 0.9 % IV SOLN: Freq: Once | INTRAVENOUS | Status: AC

## 2020-09-24 MED ORDER — SODIUM CHLORIDE 0.9 % IV SOLN
1.0000 g | Freq: Three times a day (TID) | INTRAVENOUS | Status: DC
  Administered 2020-09-24 – 2020-09-25 (×3): 1 g via INTRAVENOUS
  Filled 2020-09-24 (×5): qty 1

## 2020-09-24 MED ORDER — LORAZEPAM 2 MG/ML IJ SOLN
1.0000 mg | Freq: Once | INTRAMUSCULAR | Status: AC
  Administered 2020-09-24: 1 mg via INTRAVENOUS
  Filled 2020-09-24: qty 1

## 2020-09-24 MED ORDER — HYDROMORPHONE HCL 1 MG/ML IJ SOLN
1.0000 mg | Freq: Once | INTRAMUSCULAR | Status: AC
  Administered 2020-09-24: 1 mg via INTRAVENOUS
  Filled 2020-09-24: qty 1

## 2020-09-24 MED ORDER — ENOXAPARIN SODIUM 40 MG/0.4ML IJ SOSY
40.0000 mg | PREFILLED_SYRINGE | INTRAMUSCULAR | Status: DC
  Administered 2020-09-24 – 2020-09-28 (×5): 40 mg via SUBCUTANEOUS
  Filled 2020-09-24 (×5): qty 0.4

## 2020-09-24 MED ORDER — THIAMINE HCL 100 MG/ML IJ SOLN
100.0000 mg | Freq: Every day | INTRAMUSCULAR | Status: DC
  Administered 2020-09-24: 100 mg via INTRAVENOUS
  Filled 2020-09-24: qty 2

## 2020-09-24 MED ORDER — PRAZOSIN HCL 1 MG PO CAPS
1.0000 mg | ORAL_CAPSULE | Freq: Every day | ORAL | Status: DC
  Administered 2020-09-24 – 2020-09-27 (×4): 1 mg via ORAL
  Filled 2020-09-24 (×6): qty 1

## 2020-09-24 MED ORDER — SODIUM CHLORIDE 0.9 % IV SOLN
1.0000 g | Freq: Once | INTRAVENOUS | Status: DC
  Filled 2020-09-24: qty 1

## 2020-09-24 MED ORDER — LORAZEPAM 1 MG PO TABS: 0.0000 mg | ORAL_TABLET | Freq: Four times a day (QID) | ORAL | Status: DC

## 2020-09-24 NOTE — Progress Notes (Signed)
   Follow Up Note  Pt admitted earlier this morning. Please see H&P done this am for full details Briefly 35 year old male with significant history of alcohol abuse, hypertension, presented to the ED with worsening generalized abdominal pain started about 2 weeks ago, associated with worsening nausea/vomiting and some episodes of diarrhea.  In the ED, patient noted to have elevated LFTs, WBC 14,000.  CT abdomen showed small volume ascites, early cirrhosis and findings suggestive of developing portal hypertension.  Concern for SBP, patient was started on Merrem.  Patient admitted for further management.   Today, patient continues to complain of generalized abdominal pain, nausea/vomiting has since resolved.  GI consulted  Exam: General: NAD  Cardiovascular: S1, S2 present Respiratory: CTAB Abdomen: Soft, generalized tenderness, distended, bowel sounds present Musculoskeletal: 2+ bilateral pedal edema noted Skin: Normal Psychiatry: Normal mood   Present on Admission:  Alcohol abuse  Alcoholic cirrhosis of liver with ascites (HCC)  SBP (spontaneous bacterial peritonitis) (HCC)  Essential hypertension  Mesenteric mass   Likely alcohol hepatitis New onset alcoholic liver cirrhosis ??  Unlikely SBP Noted transaminitis Currently afebrile, with leukocytosis Hepatitis panel negative CT abdomen showed small volume ascites, early cirrhosis and findings suggestive of developing portal hypertension Limited ultrasound for ascites, no significant ascites therefore no paracentesis was performed (SBP unlikely) GI consulted, appreciate recommendations Continue Merrem for now Monitor closely  Alcohol abuse with possible withdrawal CIWA protocol

## 2020-09-24 NOTE — ED Notes (Signed)
Denies pain, anxiety or other sx. To IR at this time on 4L [place].

## 2020-09-24 NOTE — Consult Note (Signed)
Referring Provider: Dr. Lesia Sago  Primary Care Physician:  Ronnell Freshwater, NP Primary Gastroenterologist:  Althia Forts   Reason for Consultation: Nausea/vomiting, abdominal pain, diarrhea.  Newly diagnosed cirrhosis  HPI: Albert Hall is a 35 y.o. male with a past medical history of anxiety, hypertension, GERD symptoms and alcohol abuse. He presented to [facility] 09/23/2020 due to worsening N/V, diarrhea and upper abdominal pain. He developed upper abdominal pain since his back surgery 07/2020 which has progressively worsened.  Intermittently takes Naproxen.  He reported falling and landing on his stomach approximately 1 month ago which also worsened his abdominal pain intermittent bulging across the upper abdomen.  He developed nausea with vomiting two weeks ago and watery nonbloody diarrhea as reported by his wife. He stated vomiting at least once daily. He has occasional heartburn. Food sometimes feels slow to pass down his esophagus. History of alcohol abuse. Increased alcohol intake since December 2022. He drinks 1/5th of bourbon daily. He stated he last drank alcohol on 09/19/2020. No drug use.  Mother with history of alcohol use disorder and stomach issues.  No known family history of esophageal, stomach or colon cancer.  Labs in the ED showed a sodium level 133.  Potassium 2.9.  Glucose 105.  BUN less than 5.  Creatinine 0.54.  Alk Phos 212.  Albumin 2.7. Alk phos 212 AST 126.  ALT 68.  Total bili 5.7.  Lipase 23.  WBC 14.1.  Hemoglobin 12.3.  Hematocrit 36.6.  Platelet 229.  INR 1.3.  SARS coronavirus 2 negative.  CTAP with contrast showed severe hepatic steatosis with evidence of developing cirrhosis, splenomegaly and a small volume of ascites.  An indeterminate lesion in the small bowel mesentery with an unusual appearance was noted.  Trace left pleural effusion.  A paracentesis was attempted ordered but was insufficient amount of ascites to tap.  Currently, he is  somewhat somnolent.  He received Ativan IV at 5 AM and oral Ativan around 9 AM this morning.  He is somewhat restless but in no acute distress.  He is anxious.  He denies having any current nausea or vomiting.  No upper or lower abdominal pain at this time.  He stated he wished his wife was still at the bedside.  He stated he was recently seen by his PCP due to having elevated LFTs and he was advised to decrease his alcohol intake.  He stated he was diagnosed with possible cirrhosis.   Prior laboratory studies done by his PCP 09/04/2020 showed alk phos level 361.  AST 175.  ALT 102.  Total bili 2.0.  Abdominal sonogram was ordered but was not yet completed. Laboratory studies on 09/17/2020 showed a negative hepatitis C antibody.  Hepatitis B surface antigen negative.  Hepatitis B core IgM antibody negative.  Hepatitis A IgM antibody negative.  CTAP with contrast 09/23/2020: 1. Severe hepatic steatosis with evidence of developing cirrhosis. 2. Splenomegaly, which could suggest developing portal venous hypertension. 3. Small volume of ascites. 4. Indeterminate lesion in the small bowel mesentery which has an unusual appearance. This may represent an enlarged lymph node or other mesenteric mass such as a desmoid tumor. Repeat contrast enhanced CT the abdomen and pelvis is recommended in 3 months to ensure the stability or regression of this lesion 5. Trace left pleural effusion lying dependently   Past Medical History:  Diagnosis Date   Anxiety    Generalized anxiety disorder 01/08/2014   GERD (gastroesophageal reflux disease)    Hypertension  Insomnia 01/08/2014   Male sexual dysfunction    Tobacco abuse 02/11/2014   Past Surgical History:  Procedure Laterality Date   LUMBAR LAMINECTOMY/ DECOMPRESSION WITH MET-RX Right 07/24/2020   Procedure: Right Lumbar Four-Five Minimally Invasive Microdiscectomy with Metrx;  Surgeon: Karsten Ro, DO;  Location: [place];  Service: Neurosurgery;   Laterality: Right;    Prior to Admission medications   Medication Sig Start Date End Date Taking? Authorizing Provider  ALPRAZolam Duanne Moron) 0.5 MG tablet Take 1 tablet (0.5 mg total) by mouth daily as needed for anxiety. 09/11/20  Yes Boscia, Heather E, NP  amLODipine (NORVASC) 5 MG tablet Take 1 tablet (5 mg total) by mouth daily. 08/14/20  Yes Boscia, Greer Ee, NP  aspirin-sod bicarb-citric acid (ALKA-SELTZER) 325 MG TBEF tablet Take 325 mg by mouth every 6 (six) hours as needed (indestion).   Yes [provider]  escitalopram (LEXAPRO) 10 MG tablet Take 1.5 tablets (15 mg total) by mouth daily. 08/14/20  Yes Boscia, Greer Ee, NP  methocarbamol (ROBAXIN) 500 MG tablet Take 1 tablet (500 mg total) by mouth every 6 (six) hours as needed for muscle spasms. 07/24/20  Yes Dawley, Troy C, DO  omeprazole (PRILOSEC) 20 MG capsule Take 1 capsule po BID for GERD Patient taking differently: Take 20 mg by mouth 2 (two) times daily before a meal. 08/14/20  Yes Boscia, Heather E, NP  prazosin (MINIPRESS) 1 MG capsule Take 1 capsule (1 mg total) by mouth at bedtime. 09/11/20  Yes Boscia, Greer Ee, NP  Vitamin D, Ergocalciferol, (DRISDOL) 50000 units CAPS capsule Take 1 capsule (50,000 Units total) by mouth every 7 (seven) days. Patient not taking: Reported on 09/24/2020 10/18/17   Mellody Dance, DO    Current Facility-Administered Medications  Medication Dose Route Frequency Provider Last Rate Last Admin   enoxaparin (LOVENOX) injection 40 mg  40 mg Subcutaneous Q24H Jennette Kettle M, DO   40 mg at 09/24/20 1419   escitalopram (LEXAPRO) tablet 15 mg  15 mg Oral Daily Jennette Kettle M, DO   15 mg at 25/36/64 4034   folic acid (FOLVITE) tablet 1 mg  1 mg Oral Daily Jennette Kettle M, DO   1 mg at 09/24/20 7425   LORazepam (ATIVAN) tablet 1-4 mg  1-4 mg Oral Q1H PRN Etta Quill, DO   2 mg at 09/24/20 9563   Or   LORazepam (ATIVAN) injection 1-4 mg  1-4 mg Intravenous Q1H PRN Etta Quill, DO   2 mg  at 09/24/20 0525   meropenem (MERREM) 1 g in sodium chloride 0.9 % 100 mL IVPB  1 g Intravenous Q8H Laren Everts, RPH   Stopped at 09/24/20 1155   methocarbamol (ROBAXIN) tablet 500 mg  500 mg Oral Q6H PRN Etta Quill, DO   500 mg at 09/24/20 0904   morphine 2 MG/ML injection 2-4 mg  2-4 mg Intravenous Q4H PRN Etta Quill, DO   4 mg at 09/24/20 0525   ondansetron (ZOFRAN) tablet 4 mg  4 mg Oral Q6H PRN Etta Quill, DO       Or   ondansetron [facility]) injection 4 mg  4 mg Intravenous Q6H PRN Etta Quill, DO       pantoprazole (PROTONIX) EC tablet 40 mg  40 mg Oral Daily Jennette Kettle M, DO   40 mg at 09/24/20 0904   potassium chloride 10 mEq in 100 mL IVPB  10 mEq Intravenous Q1 Hr x 4 Lesia Sago  J, MD 100 mL/hr at 09/24/20 1516 10 mEq at 09/24/20 1516   prazosin (MINIPRESS) capsule 1 mg  1 mg Oral QHS Jennette Kettle M, DO       thiamine tablet 100 mg  100 mg Oral Daily Muthersbaugh, Jarrett Soho, PA-C   100 mg at 09/24/20 7414   Or   thiamine (B-1) injection 100 mg  100 mg Intravenous Daily Muthersbaugh, Jarrett Soho, PA-C   100 mg at 09/24/20 0907   Current Outpatient Medications  Medication Sig Dispense Refill   ALPRAZolam (XANAX) 0.5 MG tablet Take 1 tablet (0.5 mg total) by mouth daily as needed for anxiety. 30 tablet 0   amLODipine (NORVASC) 5 MG tablet Take 1 tablet (5 mg total) by mouth daily. 90 tablet 1   aspirin-sod bicarb-citric acid (ALKA-SELTZER) 325 MG TBEF tablet Take 325 mg by mouth every 6 (six) hours as needed (indestion).     escitalopram (LEXAPRO) 10 MG tablet Take 1.5 tablets (15 mg total) by mouth daily. 135 tablet 0   methocarbamol (ROBAXIN) 500 MG tablet Take 1 tablet (500 mg total) by mouth every 6 (six) hours as needed for muscle spasms. 90 tablet 1   omeprazole (PRILOSEC) 20 MG capsule Take 1 capsule po BID for GERD (Patient taking differently: Take 20 mg by mouth 2 (two) times daily before a meal.) 60 capsule 1   prazosin (MINIPRESS) 1 MG capsule  Take 1 capsule (1 mg total) by mouth at bedtime. 30 capsule 1   Vitamin D, Ergocalciferol, (DRISDOL) 50000 units CAPS capsule Take 1 capsule (50,000 Units total) by mouth every 7 (seven) days. (Patient not taking: Reported on 09/24/2020) 12 capsule 3    Allergies as of 09/23/2020 - Review Complete 09/11/2020  Allergen Reaction Noted   Penicillins Anaphylaxis 12/05/2011   Shrimp (diagnostic) Hives 07/23/2020   Trazodone and nefazodone Other (See Comments) 09/16/2015    Family History  Problem Relation Age of Onset   Alcoholism Mother    High blood pressure Mother    Asthma Father    Alcoholism Father    Depression Father    Asthma Sister    Diabetes Paternal Uncle    Cancer Neg Hx    Stroke Neg Hx    CAD Neg Hx     Social History   Socioeconomic History   Marital status: Married    Spouse name: Water quality scientist   Number of children: 0   Years of education: 14   Highest education level: Not on file  Occupational History   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   Activity: no regular exercise   Diet: good water, fruits/vegetables daily   Social Determinants of Health   Financial Resource Strain: Not on file  Food Insecurity: Not on file  Transportation Needs: Not on file  Physical Activity:  Not on file  Stress: Not on file  Social Connections: Not on file  Intimate Partner Violence: Not on file    Review of Systems: Gen: Denies  fever, sweats or chills. No weight loss.  CV: Denies chest pain, palpitations or edema. Resp: Denies cough, shortness of breath of hemoptysis.  GI: See HPI.    GU : Denies urinary burning, blood in urine, increased urinary frequency or incontinence. MS:  + Back pain.  Derm: Denies rash, itchiness, skin lesions or unhealing ulcers. Psych: Denies depression, anxiety or depression.  Heme: Denies easy bruising, bleeding. Neuro:  Denies headaches, dizziness or paresthesias. Endo:  Denies any problems with DM, thyroid or adrenal function.  Physical Exam: Vital signs in last 24 hours: Temp:  [98.2 F (36.8 C)-98.5 F (36.9 C)] 98.3 F (36.8 C) (06/15 1400) Pulse Rate:  [76-103] 101 (06/15 1520) Resp:  [9-25] 19 (06/15 1400) BP: (109-149)/(64-98) 132/92 (06/15 1520) SpO2:  [85 %-100 %] 95 % (06/15 1500)   General: 35 year old male somewhat somnolent but arousable and mildly agitated in no acute distress. Head:  Normocephalic and atraumatic. Eyes: Faint scleral icterus.  Conjunctiva pink. Ears:  Normal auditory acuity. Nose:  No deformity, discharge or lesions. Mouth:  Dentition intact. No ulcers or lesions.  White coating on tongue, upper palate mildly erythematous. Neck:  Supple. No lymphadenopathy or thyromegaly.  Lungs: Breath sounds clear throughout. Heart: Regular rate and rhythm, no murmurs. Abdomen: Soft, mild distention to the upper abdomen Rectal: Deferred. Musculoskeletal:  Symmetrical without gross deformities.  Pulses:  Normal pulses noted. Extremities:  Without clubbing or edema. Neurologic:  Alert and  oriented x4. No focal deficits.  Skin:  Intact without significant lesions or rashes. Psych:  Alert and cooperative. Normal mood and affect.  Intake/Output from previous day: 06/14 0701 - 06/15 0700 In: 300 [IV Piggyback:300] Out: -  Intake/Output this shift: Total I/O In: 1000 [I.V.:1000] Out: -   Lab Results: Recent Labs    09/23/20 1146 09/24/20 0633  WBC  14.1* 15.2*  HGB 12.3* 11.1*  HCT 36.6* 32.8*  PLT 229 203   BMET Recent Labs    09/23/20 1146 09/24/20 0633  NA 133* 133*  K 2.9* 3.0*  CL 93* 92*  CO2 26 26  GLUCOSE 105* 89  BUN <5* 8  CREATININE 0.54* 0.52*  CALCIUM 8.9 8.4*   LFT Recent Labs    09/24/20 0633  PROT 7.1  ALBUMIN 2.5*  AST 99*  ALT 58*  ALKPHOS 169*  BILITOT 6.7*   PT/INR Recent Labs    09/23/20 1146  LABPROT 15.7*  INR 1.3*   Hepatitis Panel No results for input(s): HEPBSAG, HCVAB, HEPAIGM, HEPBIGM in the last 72 hours.    Studies/Results: DG Ribs Unilateral W/Chest Left  Result Date: 09/24/2020 CLINICAL DATA:  Pain after fall EXAM: LEFT RIBS AND CHEST - 3+ VIEW COMPARISON:  06/05/2016 FINDINGS: No fracture or other bone lesions are seen involving the ribs. There is no evidence of pneumothorax or pleural effusion. Both lungs are clear. Heart size and mediastinal contours are within normal limits. IMPRESSION: Negative. Electronically Signed   By: Rolm Baptise M.D.   On: 09/24/2020 02:35   CT Abdomen Pelvis W Contrast  Result Date: 09/23/2020 CLINICAL DATA:  35 year old male with history of upper abdominal pain for the past 2 months. Vomiting and diarrhea. EXAM: CT ABDOMEN AND PELVIS WITH CONTRAST TECHNIQUE: Multidetector CT imaging of the abdomen and pelvis was performed using the standard protocol following bolus administration of  intravenous contrast. CONTRAST:  159m OMNIPAQUE IOHEXOL 300 MG/ML  SOLN COMPARISON:  None. FINDINGS: Lower chest: Trace left pleural effusion lying dependently. Hepatobiliary: Severe diffuse low attenuation throughout the hepatic parenchyma, indicative of severe hepatic steatosis. Liver also has a nodular contour, suggesting developing cirrhosis. No discrete cystic or solid hepatic lesions are confidently identified. No intra or extrahepatic biliary ductal dilatation. Gallbladder is normal in appearance. Pancreas: No pancreatic mass. No pancreatic ductal dilatation. No  pancreatic or peripancreatic fluid collections or inflammatory changes. Spleen: Spleen is enlarged measuring 16.8 x 8.4 x 14.8 cm (estimated splenic volume of 1,044 mL) . Adrenals/Urinary Tract: Bilateral kidneys and adrenal glands are normal in appearance. No hydroureteronephrosis. Urinary bladder is normal in appearance. Stomach/Bowel: The appearance of the stomach is normal. There is no pathologic dilatation of small bowel or colon. Normal appendix. Vascular/Lymphatic: No atherosclerotic calcifications in the abdominal aorta or pelvic vasculature. In the central small bowel mesentery (axial image 61 of series 3 and coronal image 66 of series 6) there is a 2.7 x 2.2 x 2.6 cm low-intermediate attenuation lesion (21 HU) which may represent an enlarged lymph node. No other definite lymphadenopathy is noted elsewhere in the abdomen or pelvis. Reproductive: Prostate gland and seminal vesicles are unremarkable in appearance. Other: Small volume of ascites.  No pneumoperitoneum. Musculoskeletal: There are no aggressive appearing lytic or blastic lesions noted in the visualized portions of the skeleton. IMPRESSION: 1. Severe hepatic steatosis with evidence of developing cirrhosis. 2. Splenomegaly, which could suggest developing portal venous hypertension. 3. Small volume of ascites. 4. Indeterminate lesion in the small bowel mesentery which has an unusual appearance. This may represent an enlarged lymph node or other mesenteric mass such as a desmoid tumor. Repeat contrast enhanced CT the abdomen and pelvis is recommended in 3 months to ensure the stability or regression of this lesion 5. Trace left pleural effusion lying dependently. Electronically Signed   By: DVinnie LangtonM.D.   On: 09/23/2020 20:27   IR ABDOMEN [place]ITED  Result Date: 09/24/2020 CLINICAL DATA:  35year old gentleman presented for paracentesis EXAM: LIMITED ABDOMEN ULTRASOUND FOR ASCITES TECHNIQUE: Limited ultrasound survey for ascites was  performed in all four abdominal quadrants. COMPARISON:  None. FINDINGS: No significant ascites. IMPRESSION: No significant ascites.  No paracentesis performed. Electronically Signed   By: FMiachel RouxM.D.   On: 09/24/2020 13:39    IMPRESSION/PLAN:  [age]year old male with alcohol use disorder presenting to the ED with N/V/D and abdominal pain.  Elevated LFTs. AST 126 ->99. ALT 68 -> 58. Alk phos 212 -> 169. T. Bili 5.7 -> 6.7.  Leukocytosis with WBC 15.2. Likely has alcohol associated hepatitis. MDF 16.7. CTAP identified severe hepatic steatosis with developing cirrhosis, small amount of ascites and splenomegaly.  Abdominal sonogram completed by IR, insufficient amount of ascites for paracentesis. -Hepatic panel, BMP and INR in a.m. -Ceruloplasmin, ANA, SMA, AMA, IgG, A1AT, iron, ferritin, hepatitis B surface antibody, hepatitis B core total antibody, hepatitis A total antibody in a.m. -PPI 497mpo QD -Thiamine, multivitamin and folate -CIWA protocol in process, monitor neuro status closely  -Eventual EGD to assess for esophageal/gastric varices  -Monitor neuro status closely  -Patient counseled no alcohol ever -No NSAID use -Clear liquid diet for now -Further recommendations per Dr. MaRush Landmark2. Normocytic anemia.  Hemoglobin 12.3 -> 11.1 MCV 100.  No overt GI bleeding.  B12 level 1560 on 09/17/2020. -CBC and iron panel in am  3. Hypokalemia. K + 3.0.  -KCL replacement per  the hospitalist   4. Leukocytosis, WBC 15.2. Received Meropenem IV for suspected SBP.   -Does he require Meropenem?   5. Diarrhea, likely related to alcohol use -Check GI pathogen panel and C. Diff PCR if patient has diarrhea   6.  CTAP identified an indeterminate lesion in the small bowel mesentery which has an unusual appearance.  -Defer further evaluation to the hospitalist  Noralyn Pick  09/24/2020, 3:46 PM

## 2020-09-24 NOTE — Progress Notes (Signed)
Patient ID: Albert Hall, male   DOB: August 07, 1985, 35 y.o.   MRN: [id number] Pt presented to IR today for paracentesis. On limited US abd in all 4 quadrants there is only a trace amount of fluid in perihepatic region and not enough to safely tap at this time. Procedure cancelled. Can repeat US in few days if desired to reassess for any potential increase in ascites.

## 2020-09-24 NOTE — ED Notes (Signed)
Attempted to call report

## 2020-09-24 NOTE — H&P (Signed)
History and Physical    SAVIR BLANKE BWG:[id number] DOB: 1985-07-09 DOA: 09/23/2020  PCP: Ronnell Freshwater, NP  Patient coming from: Home  I have personally briefly reviewed patient's old medical records in [place]  Chief Complaint: Abd pain  HPI: Albert Hall is a 35 y.o. male with medical history significant of EtOH abuse, HTN.  Pt presents to the ED with c/o gradual onset, persistent, progressively worsening abd pain.  Symptoms onset 2 weeks ago.  Now severe.  Associated N/V which is also working.  Until 2 weeks ago large amount of EtOH intake daily, slowly cutting down.  No fevers nor chills.  Pain worse with any movement or palpation.   ED Course: WBC 14k.  AST 126, ALT 68.  TBili 5.7  Hepatitis pnl was neg last week.  CT AP: 1) cirrhosis and findings suggestive of developing portal HTN, small volume ascites 2) indeterminate lesion in small bowel mesentery - recd follow up CT in 3 months.  EDP ordered merrem for suspected SBP.   Review of Systems: As per HPI, otherwise all review of systems negative.  Past Medical History:  Diagnosis Date   Anxiety    Generalized anxiety disorder 01/08/2014   GERD (gastroesophageal reflux disease)    Hypertension    Insomnia 01/08/2014   Male sexual dysfunction    Tobacco abuse 02/11/2014    Past Surgical History:  Procedure Laterality Date   LUMBAR LAMINECTOMY/ DECOMPRESSION WITH MET-RX Right 07/24/2020   Procedure: Right Lumbar Four-Five Minimally Invasive Microdiscectomy with Metrx;  Surgeon: Karsten Ro, DO;  Location: [facility];  Service: Neurosurgery;  Laterality: Right;     reports that he has been smoking cigarettes. He has a 0.75 pack-year smoking history. He has quit using smokeless tobacco. He reports current alcohol use of about 7.0 - 8.0 standard drinks of alcohol per week. He reports that he does not use drugs.  Allergies  Allergen Reactions   Penicillins Anaphylaxis   Shrimp (Diagnostic)  Hives   Gabapentin     Other reaction(s): Hallucinations   Hydrochlorothiazide    Iodine    Trazodone And Nefazodone Other (See Comments)    Affected hearing, malaise    Family History  Problem Relation Age of Onset   Alcoholism Mother    High blood pressure Mother    Asthma Father    Alcoholism Father    Depression Father    Asthma Sister    Diabetes Paternal Uncle    Cancer Neg Hx    Stroke Neg Hx    CAD Neg Hx      Prior to Admission medications   Medication Sig Start Date End Date Taking? Authorizing Provider  ALPRAZolam Duanne Moron) 0.5 MG tablet Take 1 tablet (0.5 mg total) by mouth daily as needed for anxiety. 09/11/20  Yes Boscia, Heather E, NP  amLODipine (NORVASC) 5 MG tablet Take 1 tablet (5 mg total) by mouth daily. 08/14/20  Yes Boscia, Greer Ee, NP  aspirin-sod bicarb-citric acid (ALKA-SELTZER) 325 MG TBEF tablet Take 325 mg by mouth every 6 (six) hours as needed (indestion).   Yes [provider]  escitalopram (LEXAPRO) 10 MG tablet Take 1.5 tablets (15 mg total) by mouth daily. 08/14/20  Yes Boscia, Greer Ee, NP  methocarbamol (ROBAXIN) 500 MG tablet Take 1 tablet (500 mg total) by mouth every 6 (six) hours as needed for muscle spasms. 07/24/20  Yes Dawley, Troy C, DO  omeprazole (PRILOSEC) 20 MG capsule Take 1 capsule po BID  for GERD Patient taking differently: Take 20 mg by mouth 2 (two) times daily before a meal. 08/14/20  Yes Boscia, Heather E, NP  prazosin (MINIPRESS) 1 MG capsule Take 1 capsule (1 mg total) by mouth at bedtime. 09/11/20  Yes Boscia, Greer Ee, NP  Vitamin D, Ergocalciferol, (DRISDOL) 50000 units CAPS capsule Take 1 capsule (50,000 Units total) by mouth every 7 (seven) days. Patient not taking: Reported on 09/24/2020 10/18/17   Mellody Dance, DO    Physical Exam: Vitals:   09/23/20 2345 09/24/20 0000 09/24/20 0230 09/24/20 0232  BP: 133/80 132/79 137/88 137/88  Pulse: 94 99 98 98  Resp: 18  18   Temp:      TempSrc:      SpO2: 96% 97% 100%      Constitutional: NAD, calm, comfortable Eyes: PERRL, lids and conjunctivae normal ENMT: Mucous membranes are moist. Posterior pharynx clear of any exudate or lesions.Normal dentition.  Neck: normal, supple, no masses, no thyromegaly Respiratory: clear to auscultation bilaterally, no wheezing, no crackles. Normal respiratory effort. No accessory muscle use.  Cardiovascular: Regular rate and rhythm, no murmurs / rubs / gallops. No extremity edema. 2+ pedal pulses. No carotid bruits.  Abdomen: Severe TTP with guarding Musculoskeletal: no clubbing / cyanosis. No joint deformity upper and lower extremities. Good ROM, no contractures. Normal muscle tone.  Skin: no rashes, lesions, ulcers. No induration Neurologic: CN 2-12 grossly intact. Sensation intact, DTR normal. Strength 5/5 in all 4.  Psychiatric: Normal judgment and insight. Alert and oriented x 3. Normal mood.    Labs on Admission: I have personally reviewed following labs and imaging studies  CBC: Recent Labs  Lab 09/23/20 1146  WBC 14.1*  NEUTROABS 11.4*  HGB 12.3*  HCT 36.6*  MCV 98.7  PLT 097   Basic Metabolic Panel: Recent Labs  Lab 09/17/20 1053 09/23/20 1146  NA 139 133*  K 2.9* 2.9*  CL 95* 93*  CO2 25 26  GLUCOSE 117* 105*  BUN 6 <5*  CREATININE 0.58* 0.54*  CALCIUM 8.6* 8.9   GFR: Estimated Creatinine Clearance: 177.2 mL/min (A) (by C-G formula based on SCr of 0.54 mg/dL (L)). Liver Function Tests: Recent Labs  Lab 09/17/20 1053 09/23/20 1146  AST 187* 126*  ALT 94* 68*  ALKPHOS 330* 212*  BILITOT 4.7* 5.7*  PROT 7.0 7.3  ALBUMIN 3.4* 2.7*   Recent Labs  Lab 09/23/20 1146  LIPASE 23   No results for input(s): AMMONIA in the last 168 hours. Coagulation Profile: Recent Labs  Lab 09/23/20 1146  INR 1.3*   Cardiac Enzymes: No results for input(s): CKTOTAL, CKMB, CKMBINDEX, TROPONINI in the last 168 hours. BNP (last 3 results) No results for input(s): PROBNP in the last 8760  hours. HbA1C: No results for input(s): HGBA1C in the last 72 hours. CBG: No results for input(s): GLUCAP in the last 168 hours. Lipid Profile: No results for input(s): CHOL, HDL, LDLCALC, TRIG, CHOLHDL, LDLDIRECT in the last 72 hours. Thyroid Function Tests: No results for input(s): TSH, T4TOTAL, FREET4, T3FREE, THYROIDAB in the last 72 hours. Anemia Panel: No results for input(s): VITAMINB12, FOLATE, FERRITIN, TIBC, IRON, RETICCTPCT in the last 72 hours. Urine analysis: No results found for: COLORURINE, APPEARANCEUR, [place], [place], [place], [place], BILIRUBINUR, KETONESUR, PROTEINUR, UROBILINOGEN, NITRITE, LEUKOCYTESUR  Radiological Exams on Admission: DG Ribs Unilateral W/Chest Left  Result Date: 09/24/2020 CLINICAL DATA:  Pain after fall EXAM: LEFT RIBS AND CHEST - 3+ VIEW COMPARISON:  06/05/2016 FINDINGS: No fracture or other bone lesions  are seen involving the ribs. There is no evidence of pneumothorax or pleural effusion. Both lungs are clear. Heart size and mediastinal contours are within normal limits. IMPRESSION: Negative. Electronically Signed   By: Rolm Baptise M.D.   On: 09/24/2020 02:35   CT Abdomen Pelvis W Contrast  Result Date: 09/23/2020 CLINICAL DATA:  35 year old male with history of upper abdominal pain for the past 2 months. Vomiting and diarrhea. EXAM: CT ABDOMEN AND PELVIS WITH CONTRAST TECHNIQUE: Multidetector CT imaging of the abdomen and pelvis was performed using the standard protocol following bolus administration of intravenous contrast. CONTRAST:  147m OMNIPAQUE IOHEXOL 300 MG/ML  SOLN COMPARISON:  None. FINDINGS: Lower chest: Trace left pleural effusion lying dependently. Hepatobiliary: Severe diffuse low attenuation throughout the hepatic parenchyma, indicative of severe hepatic steatosis. Liver also has a nodular contour, suggesting developing cirrhosis. No discrete cystic or solid hepatic lesions are confidently identified. No intra or extrahepatic biliary  ductal dilatation. Gallbladder is normal in appearance. Pancreas: No pancreatic mass. No pancreatic ductal dilatation. No pancreatic or peripancreatic fluid collections or inflammatory changes. Spleen: Spleen is enlarged measuring 16.8 x 8.4 x 14.8 cm (estimated splenic volume of 1,044 mL) . Adrenals/Urinary Tract: Bilateral kidneys and adrenal glands are normal in appearance. No hydroureteronephrosis. Urinary bladder is normal in appearance. Stomach/Bowel: The appearance of the stomach is normal. There is no pathologic dilatation of small bowel or colon. Normal appendix. Vascular/Lymphatic: No atherosclerotic calcifications in the abdominal aorta or pelvic vasculature. In the central small bowel mesentery (axial image 61 of series 3 and coronal image 66 of series 6) there is a 2.7 x 2.2 x 2.6 cm low-intermediate attenuation lesion (21 HU) which may represent an enlarged lymph node. No other definite lymphadenopathy is noted elsewhere in the abdomen or pelvis. Reproductive: Prostate gland and seminal vesicles are unremarkable in appearance. Other: Small volume of ascites.  No pneumoperitoneum. Musculoskeletal: There are no aggressive appearing lytic or blastic lesions noted in the visualized portions of the skeleton. IMPRESSION: 1. Severe hepatic steatosis with evidence of developing cirrhosis. 2. Splenomegaly, which could suggest developing portal venous hypertension. 3. Small volume of ascites. 4. Indeterminate lesion in the small bowel mesentery which has an unusual appearance. This may represent an enlarged lymph node or other mesenteric mass such as a desmoid tumor. Repeat contrast enhanced CT the abdomen and pelvis is recommended in 3 months to ensure the stability or regression of this lesion 5. Trace left pleural effusion lying dependently. Electronically Signed   By: DVinnie LangtonM.D.   On: 09/23/2020 20:27    EKG: Independently reviewed.  Assessment/Plan Principal Problem:   SBP (spontaneous  bacterial peritonitis) ([place] Active Problems:   Alcohol abuse   Essential hypertension   Alcoholic cirrhosis of liver with ascites (HCC)   Mesenteric mass    SBP - suspected Empiric merrem Did warn pt of 5% chance of cross reactivity with PCN allergy Fluoroquinolones are the other alternative to beta-lactams but these dont have as good penetration to ascitic fluid. IR paracentesis in AM Repeat CBC/CMP in AM NPO IVF EtOH cirrhosis with ascites - Likely needs follow up with GI Mesenteric mass - F/u CT in 3 months HTN - Cont home amlodipine EtOH abuse - CIWA  DVT prophylaxis: SCDs for now Code Status: Full Family Communication: No family in room Disposition Plan: Home after admit Consults called: None Admission status: Place in o66  Rogerio Boutelle, JWoodfieldHospitalists  How to contact the [facility] HospitalAttending or Consulting provider 7A -  7P or covering provider during after hours [place], for this patient?  Check the care team in [facility] and look for a) attending/consulting [facility] provider listed and b) the [facility] team listed Log into [email or web address]  Amion Physician Scheduling and messaging for groups and whole hospitals  On call and physician scheduling software for group practices, residents, hospitalists and other medical providers for call, clinic, rotation and shift schedules. On[employer] is a hospital-wide system for scheduling doctors and paging doctors on call. EasyPlot is for scientific plotting and data analysis.  [email or web address]  and use [facility]'s universal password to access. If you do not have the password, please contact the hospital operator.  Locate the [facility] provider you are looking for under [facility] and page to a number that you can be directly reached. If you still have difficulty reaching the provider, please page the [facility] (Director on Call) for the Hospitalists listed on amion for assistance.  09/24/2020, 3:54 AM

## 2020-09-24 NOTE — ED Provider Notes (Signed)
[facility] EMERGENCY DEPARTMENT Provider Note   CSN: [id number] Arrival date & time: 09/23/20  1106     History Chief Complaint  Patient presents with   Abdominal Pain    Albert Hall is a 35 y.o. male presents to the Emergency Department complaining of gradual, persistent, progressively worsening abdominal pain onset 2 weeks ago.  Patient reports around that time he did fall striking the left side of his chest and abdomen.  Reports that for a while he had a bruise but this seems to be improving.  He reports that the abdominal pain has continued to worsen over the last 2 weeks and he has had worsening nausea and vomiting as well.  Wife at bedside assists with history.  She reports patient is unable to keep down foods or liquids at this time.  Until 2 weeks ago patient was drinking large amounts of bourbon every night.  He has slowly been cutting down on the amount of alcohol that he has been drinking after discussions with his primary care provider.  Patient reports pain in his abdomen has become so severe that he is having a difficult time breathing.  Denies fevers or chills.  Patient reports any movement or palpation makes his symptoms worse.  Nothing makes it better.  The history is provided by the patient, the spouse and medical records. No language interpreter was used.      Past Medical History:  Diagnosis Date   Anxiety    Generalized anxiety disorder 01/08/2014   GERD (gastroesophageal reflux disease)    Hypertension    Insomnia 01/08/2014   Male sexual dysfunction    Tobacco abuse 02/11/2014    Patient Active Problem List   Diagnosis Date Noted   Alcoholic cirrhosis of liver with ascites ([place]) 09/24/2020   SBP (spontaneous bacterial peritonitis) ([place]) 09/24/2020   Elevated liver function tests 09/12/2020   Other ascites 09/12/2020   Encounter to establish care 08/30/2020   Essential hypertension 08/30/2020   Adjustment disorder with depressed mood  11/15/2017   Musculoskeletal chest pain 10/10/2017   Emotional crisis, acute reaction to stress 10/10/2017   Chronic post-traumatic stress disorder (PTSD)- history from being active duty 10/10/2017   Alcohol abuse 10/10/2017   Insomnia due to psychological stress 10/10/2017   Elevated blood pressure, situational- likely due to stress ( OK at home when pt checks it- usually less than 130/80 ) 10/10/2017   Vitamin D deficiency 12/08/2015   fatigue and daytime Somnolence 12/03/2015   BMI >er 30 11/15/2015   mild Fatigue 11/11/2015   Stressful life event affecting family 09/16/2015   Gastroesophageal reflux disease    Tobacco abuse 02/11/2014   H/o Insomnia 01/08/2014   Generalized anxiety disorder/ PTSD 01/08/2014    Past Surgical History:  Procedure Laterality Date   LUMBAR LAMINECTOMY/ DECOMPRESSION WITH MET-RX Right 07/24/2020   Procedure: Right Lumbar Four-Five Minimally Invasive Microdiscectomy with Metrx;  Surgeon: Karsten Ro, DO;  Location: [facility];  Service: Neurosurgery;  Laterality: Right;       Family History  Problem Relation Age of Onset   Alcoholism Mother    High blood pressure Mother    Asthma Father    Alcoholism Father    Depression Father    Asthma Sister    Diabetes Paternal Uncle    Cancer Neg Hx    Stroke Neg Hx    CAD Neg Hx     Social History   Tobacco Use   Smoking status: Every  Day    Packs/day: 0.25    Years: 3.00    Pack years: 0.75    Types: Cigarettes   Smokeless tobacco: Former  Substance Use Topics   Alcohol use: Yes    Alcohol/week: 7.0 - 8.0 standard drinks    Types: 7 Cans of beer per week    Comment: 1 beer nightly   Drug use: No    Home Medications Prior to Admission medications   Medication Sig Start Date End Date Taking? Authorizing Provider  ALPRAZolam Duanne Moron) 0.5 MG tablet Take 1 tablet (0.5 mg total) by mouth daily as needed for anxiety. 09/11/20  Yes Boscia, Heather E, NP  amLODipine (NORVASC) 5 MG tablet Take 1  tablet (5 mg total) by mouth daily. 08/14/20  Yes Boscia, Greer Ee, NP  aspirin-sod bicarb-citric acid (ALKA-SELTZER) 325 MG TBEF tablet Take 325 mg by mouth every 6 (six) hours as needed (indestion).   Yes [provider]  escitalopram (LEXAPRO) 10 MG tablet Take 1.5 tablets (15 mg total) by mouth daily. 08/14/20  Yes Boscia, Greer Ee, NP  methocarbamol (ROBAXIN) 500 MG tablet Take 1 tablet (500 mg total) by mouth every 6 (six) hours as needed for muscle spasms. 07/24/20  Yes Dawley, Troy C, DO  omeprazole (PRILOSEC) 20 MG capsule Take 1 capsule po BID for GERD Patient taking differently: Take 20 mg by mouth 2 (two) times daily before a meal. 08/14/20  Yes Boscia, Heather E, NP  prazosin (MINIPRESS) 1 MG capsule Take 1 capsule (1 mg total) by mouth at bedtime. 09/11/20  Yes Boscia, Greer Ee, NP  Vitamin D, Ergocalciferol, (DRISDOL) 50000 units CAPS capsule Take 1 capsule (50,000 Units total) by mouth every 7 (seven) days. Patient not taking: Reported on 09/24/2020 10/18/17   Mellody Dance, DO    Allergies    Penicillins, Shrimp (diagnostic), Gabapentin, Hydrochlorothiazide, Iodine, and Trazodone and nefazodone  Review of Systems   Review of Systems  Constitutional:  Positive for fatigue. Negative for appetite change, diaphoresis, fever and unexpected weight change.  HENT:  Negative for mouth sores.   Eyes:  Negative for visual disturbance.  Respiratory:  Negative for cough, chest tightness, shortness of breath and wheezing.   Cardiovascular:  Negative for chest pain.  Gastrointestinal:  Positive for abdominal distention, abdominal pain, nausea and vomiting. Negative for constipation and diarrhea.  Endocrine: Negative for polydipsia, polyphagia and polyuria.  Genitourinary:  Negative for dysuria, frequency, hematuria and urgency.  Musculoskeletal:  Negative for back pain and neck stiffness.  Skin:  Negative for rash.  Allergic/Immunologic: Negative for immunocompromised state.   Neurological:  Negative for syncope, light-headedness and headaches.  Hematological:  Bruises/bleeds easily.  Psychiatric/Behavioral:  Negative for sleep disturbance. The patient is not nervous/anxious.    Physical Exam Updated Vital Signs BP 132/79   Pulse 99   Temp 98.5 F (36.9 C) (Oral)   Resp 18   SpO2 97%   Physical Exam Vitals and nursing note reviewed.  Constitutional:      General: He is not in acute distress.    Appearance: He is ill-appearing. He is not diaphoretic.  HENT:     Head: Normocephalic.  Eyes:     General: No scleral icterus.    Extraocular Movements: Extraocular movements intact.     Conjunctiva/sclera: Conjunctivae normal.  Cardiovascular:     Rate and Rhythm: Normal rate and regular rhythm.     Pulses: Normal pulses.          Radial pulses are 2+ on  the right side and 2+ on the left side.  Pulmonary:     Effort: No tachypnea, accessory muscle usage, prolonged expiration, respiratory distress or retractions.     Breath sounds: No stridor.     Comments: Equal chest rise. No increased work of breathing. Abdominal:     General: Abdomen is protuberant. There is distension.     Tenderness: There is abdominal tenderness (Exquisite tenderness throughout). There is no guarding or rebound.  Musculoskeletal:     Cervical back: Normal range of motion.     Right lower leg: 1+ Pitting Edema present.     Left lower leg: 1+ Pitting Edema present.     Comments: Moves all extremities equally and without difficulty.  Skin:    General: Skin is warm and dry.     Capillary Refill: Capillary refill takes less than 2 seconds.     Coloration: Skin is pale.  Neurological:     General: No focal deficit present.     Mental Status: He is alert.     GCS: GCS eye subscore is 4. GCS verbal subscore is 5. GCS motor subscore is 6.     Comments: Speech is clear and goal oriented.  Psychiatric:        Mood and Affect: Mood is anxious.    ED Results / Procedures /  Treatments   Labs (all labs ordered are listed, but only abnormal results are displayed) Labs Reviewed  COMPREHENSIVE METABOLIC PANEL - Abnormal; Notable for the following components:      Result Value   Sodium 133 (*)    Potassium 2.9 (*)    Chloride 93 (*)    Glucose, Bld 105 (*)    BUN <5 (*)    Creatinine, Ser 0.54 (*)    Albumin 2.7 (*)    AST 126 (*)    ALT 68 (*)    Alkaline Phosphatase 212 (*)    Total Bilirubin 5.7 (*)    All other components within normal limits  CBC WITH DIFFERENTIAL/PLATELET - Abnormal; Notable for the following components:   WBC 14.1 (*)    RBC 3.71 (*)    Hemoglobin 12.3 (*)    HCT 36.6 (*)    RDW 17.1 (*)    Neutro Abs 11.4 (*)    Monocytes Absolute 1.1 (*)    Abs Immature Granulocytes 0.18 (*)    All other components within normal limits  PROTIME-INR - Abnormal; Notable for the following components:   Prothrombin Time 15.7 (*)    INR 1.3 (*)    All other components within normal limits  RESP PANEL BY RT-PCR (FLU A&B, COVID) ARPGX2  LIPASE, BLOOD  URINALYSIS, ROUTINE W REFLEX MICROSCOPIC  ETHANOL    EKG None  Radiology DG Ribs Unilateral W/Chest Left  Result Date: 09/24/2020 CLINICAL DATA:  Pain after fall EXAM: LEFT RIBS AND CHEST - 3+ VIEW COMPARISON:  06/05/2016 FINDINGS: No fracture or other bone lesions are seen involving the ribs. There is no evidence of pneumothorax or pleural effusion. Both lungs are clear. Heart size and mediastinal contours are within normal limits. IMPRESSION: Negative. Electronically Signed   By: Rolm Baptise M.D.   On: 09/24/2020 02:35   CT Abdomen Pelvis W Contrast  Result Date: 09/23/2020 CLINICAL DATA:  35 year old male with history of upper abdominal pain for the past 2 months. Vomiting and diarrhea. EXAM: CT ABDOMEN AND PELVIS WITH CONTRAST TECHNIQUE: Multidetector CT imaging of the abdomen and pelvis was performed using the standard protocol  following bolus administration of intravenous contrast.  CONTRAST:  132m OMNIPAQUE IOHEXOL 300 MG/ML  SOLN COMPARISON:  None. FINDINGS: Lower chest: Trace left pleural effusion lying dependently. Hepatobiliary: Severe diffuse low attenuation throughout the hepatic parenchyma, indicative of severe hepatic steatosis. Liver also has a nodular contour, suggesting developing cirrhosis. No discrete cystic or solid hepatic lesions are confidently identified. No intra or extrahepatic biliary ductal dilatation. Gallbladder is normal in appearance. Pancreas: No pancreatic mass. No pancreatic ductal dilatation. No pancreatic or peripancreatic fluid collections or inflammatory changes. Spleen: Spleen is enlarged measuring 16.8 x 8.4 x 14.8 cm (estimated splenic volume of 1,044 mL) . Adrenals/Urinary Tract: Bilateral kidneys and adrenal glands are normal in appearance. No hydroureteronephrosis. Urinary bladder is normal in appearance. Stomach/Bowel: The appearance of the stomach is normal. There is no pathologic dilatation of small bowel or colon. Normal appendix. Vascular/Lymphatic: No atherosclerotic calcifications in the abdominal aorta or pelvic vasculature. In the central small bowel mesentery (axial image 61 of series 3 and coronal image 66 of series 6) there is a 2.7 x 2.2 x 2.6 cm low-intermediate attenuation lesion (21 HU) which may represent an enlarged lymph node. No other definite lymphadenopathy is noted elsewhere in the abdomen or pelvis. Reproductive: Prostate gland and seminal vesicles are unremarkable in appearance. Other: Small volume of ascites.  No pneumoperitoneum. Musculoskeletal: There are no aggressive appearing lytic or blastic lesions noted in the visualized portions of the skeleton. IMPRESSION: 1. Severe hepatic steatosis with evidence of developing cirrhosis. 2. Splenomegaly, which could suggest developing portal venous hypertension. 3. Small volume of ascites. 4. Indeterminate lesion in the small bowel mesentery which has an unusual appearance. This may  represent an enlarged lymph node or other mesenteric mass such as a desmoid tumor. Repeat contrast enhanced CT the abdomen and pelvis is recommended in 3 months to ensure the stability or regression of this lesion 5. Trace left pleural effusion lying dependently. Electronically Signed   By: DVinnie LangtonM.D.   On: 09/23/2020 20:27    Procedures Procedures   Medications Ordered in ED Medications  LORazepam (ATIVAN) injection 0-4 mg (0 mg Intravenous Not Given 09/24/20 0234)    Or  LORazepam (ATIVAN) tablet 0-4 mg ( Oral See Alternative 09/24/20 0234)  LORazepam (ATIVAN) injection 0-4 mg (has no administration in time range)    Or  LORazepam (ATIVAN) tablet 0-4 mg (has no administration in time range)  thiamine tablet 100 mg (has no administration in time range)    Or  thiamine (B-1) injection 100 mg (has no administration in time range)  potassium chloride 10 mEq in 100 mL IVPB (10 mEq Intravenous New Bag/Given 09/24/20 0250)  meropenem (MERREM) 1 g in sodium chloride 0.9 % 100 mL IVPB (has no administration in time range)  iohexol (OMNIPAQUE) 300 MG/ML solution 100 mL (100 mLs Intravenous Contrast Given 09/23/20 2001)  HYDROmorphone (DILAUDID) injection 1 mg (1 mg Intravenous Given 09/24/20 0202)  ondansetron (ZOFRAN) injection 4 mg (4 mg Intravenous Given 09/24/20 0202)  LORazepam (ATIVAN) injection 1 mg (1 mg Intravenous Given 09/24/20 0228)  0.9 %  sodium chloride infusion ( Intravenous New Bag/Given 09/24/20 0250)    ED Course  I have reviewed the triage vital signs and the nursing notes.  Pertinent labs & imaging results that were available during my care of the patient were reviewed by me and considered in my medical decision making (see chart for details).    MDM Rules/Calculators/A&P  Patient presents with consistent nausea, vomiting and severe abdominal pain.  On exam abdomen is exquisitely tender, significantly limiting exam.  Patient actively vomiting  in the room.  With anemia.  Suspect this is anemia of chronic disease secondary to his alcoholism.  Elevated PT/INR in the setting of new diagnosis of cirrhosis.  Patient does have ascites on CT scan.  Hypochloremia, hyponatremia and hypokalemia all noted.  Suspect this is secondary to patient not eating and drinking much.  IV potassium ordered as patient is unable to keep down orals at this time.  Elevation in AST and ALT consistent with alcoholism however significant elevation in alkaline phosphatase and total bili.  Concern for SBP versus hepatic obstruction.  Will need to be evaluated by GI.  CT scan without bowel obstruction, but undefined lesion of the colon is noted.  Patient will need close follow-up with this.  Given the symptoms, abnormal labs and inability to control vomiting here in the emergency department patient will need admission.  Meropenem given.  The patient was discussed with and evaluated by Dr. Roslynn Amble who agrees with the treatment plan.  2:50 AM Discussed patient's case with hospitalist, Dr. Alcario Drought.  I have recommended admission and patient (and family if present) agree with this plan. Admitting physician will place admission orders.    Final Clinical Impression(s) / ED Diagnoses Final diagnoses:  Intractable vomiting with nausea, unspecified vomiting type  Generalized abdominal pain  Alcoholic cirrhosis of liver with ascites [facility])    Rx / DC Orders ED Discharge Orders     None        Aluna Whiston, Gwenlyn Perking 09/24/20 0251    Lucrezia Starch, MD 09/25/20 [phone]

## 2020-09-24 NOTE — ED Notes (Signed)
Hospitalist in room asked transporter to wait as they were just beginning exam so pt not able to be take to room at this time

## 2020-09-24 NOTE — ED Notes (Signed)
Patient transported to X-ray 

## 2020-09-24 NOTE — ED Notes (Addendum)
Pt alert, NAD, calm, sitting upright on side of stretcher. C/o pain and anxiety. Woke startled d/t shift change activity. Repositioned to lying. IVF infusing. Denies current questions or needs. Pending IR paracentesis at ~11am.

## 2020-09-24 NOTE — Plan of Care (Signed)
  Problem: Clinical Measurements: Goal: Ability to maintain clinical measurements within normal limits will improve Outcome: Progressing  Alert and oriented x4 . Reports 0/10 pain, Ciwa Score 2.  Oriented to the room, call bell close by , floor mats and bed alarm.

## 2020-09-24 NOTE — Progress Notes (Signed)
Pharmacy Antibiotic Note  Albert Hall is a 35 y.o. male admitted on 09/23/2020 with  SBP .  Pharmacy has been consulted for meropenem dosing.  Plan: Meropenem 1g IV Q8H.  Temp (24hrs), Avg:98.5 F (36.9 C), Min:98.2 F (36.8 C), Max:98.8 F (37.1 C)  Recent Labs  Lab 09/17/20 1053 09/23/20 1146  WBC  --  14.1*  CREATININE 0.58* 0.54*    Estimated Creatinine Clearance: 177.2 mL/min (A) (by C-G formula based on SCr of 0.54 mg/dL (L)).    Allergies  Allergen Reactions   Penicillins Anaphylaxis   Shrimp (Diagnostic) Hives   Gabapentin     Other reaction(s): Hallucinations   Hydrochlorothiazide    Iodine    Trazodone And Nefazodone Other (See Comments)    Affected hearing, malaise     Thank you for allowing pharmacy to be a part of this patient's care.  Vernard Gambles, PharmD, BCPS  09/24/2020 3:37 AM

## 2020-09-25 ENCOUNTER — Telehealth: Payer: Self-pay | Admitting: Nurse Practitioner

## 2020-09-25 DIAGNOSIS — F1010 Alcohol abuse, uncomplicated: Secondary | ICD-10-CM

## 2020-09-25 DIAGNOSIS — K7010 Alcoholic hepatitis without ascites: Secondary | ICD-10-CM

## 2020-09-25 LAB — CBC
HCT: 31.2 % — ABNORMAL LOW (ref 39.0–52.0)
Hemoglobin: 10.5 g/dL — ABNORMAL LOW (ref 13.0–17.0)
MCH: 34.2 pg — ABNORMAL HIGH (ref 26.0–34.0)
MCHC: 33.7 g/dL (ref 30.0–36.0)
MCV: 101.6 fL — ABNORMAL HIGH (ref 80.0–100.0)
Platelets: 164 10*3/uL (ref 150–400)
RBC: 3.07 MIL/uL — ABNORMAL LOW (ref 4.22–5.81)
RDW: 17.2 % — ABNORMAL HIGH (ref 11.5–15.5)
WBC: 10.1 10*3/uL (ref 4.0–10.5)
nRBC: 0 % (ref 0.0–0.2)

## 2020-09-25 LAB — HEPATIC FUNCTION PANEL
ALT: 49 U/L — ABNORMAL HIGH (ref 0–44)
AST: 103 U/L — ABNORMAL HIGH (ref 15–41)
Albumin: 2.2 g/dL — ABNORMAL LOW (ref 3.5–5.0)
Alkaline Phosphatase: 181 U/L — ABNORMAL HIGH (ref 38–126)
Bilirubin, Direct: 3 mg/dL — ABNORMAL HIGH (ref 0.0–0.2)
Indirect Bilirubin: 2.8 mg/dL — ABNORMAL HIGH (ref 0.3–0.9)
Total Bilirubin: 5.8 mg/dL — ABNORMAL HIGH (ref 0.3–1.2)
Total Protein: 6.1 g/dL — ABNORMAL LOW (ref 6.5–8.1)

## 2020-09-25 LAB — BASIC METABOLIC PANEL
Anion gap: 8 (ref 5–15)
BUN: 7 mg/dL (ref 6–20)
CO2: 28 mmol/L (ref 22–32)
Calcium: 8.3 mg/dL — ABNORMAL LOW (ref 8.9–10.3)
Chloride: 100 mmol/L (ref 98–111)
Creatinine, Ser: 0.55 mg/dL — ABNORMAL LOW (ref 0.61–1.24)
GFR, Estimated: >60 mL/min (ref >60)
Glucose, Bld: 83 mg/dL (ref 70–99)
Potassium: 3.2 mmol/L — ABNORMAL LOW (ref 3.5–5.1)
Sodium: 136 mmol/L (ref 135–145)

## 2020-09-25 LAB — AMMONIA: Ammonia: 59 umol/L — ABNORMAL HIGH (ref 9–35)

## 2020-09-25 LAB — IRON AND TIBC
Iron: 58 ug/dL (ref 45–182)
Saturation Ratios: 31 % (ref 17.9–39.5)
TIBC: 185 ug/dL — ABNORMAL LOW (ref 250–450)
UIBC: 127 ug/dL

## 2020-09-25 LAB — PROTIME-INR
INR: 1.3 — ABNORMAL HIGH (ref 0.8–1.2)
Prothrombin Time: 16.6 seconds — ABNORMAL HIGH (ref 11.4–15.2)

## 2020-09-25 LAB — FERRITIN: Ferritin: 323 ng/mL (ref 24–336)

## 2020-09-25 LAB — HEPATITIS B CORE ANTIBODY, TOTAL: Hep B Core Total Ab: NONREACTIVE

## 2020-09-25 LAB — HEPATITIS A ANTIBODY, TOTAL: hep A Total Ab: REACTIVE — AB

## 2020-09-25 MED ORDER — POTASSIUM CHLORIDE 10 MEQ/100ML IV SOLN
10.0000 meq | INTRAVENOUS | Status: AC
  Administered 2020-09-25 (×4): 10 meq via INTRAVENOUS
  Filled 2020-09-25 (×4): qty 100

## 2020-09-25 NOTE — Progress Notes (Signed)
Gastroenterology Inpatient Follow-up Note   PATIENT IDENTIFICATION  Albert [name] is a 35 y.o. male with a pmh significant for alcohol use disorder, anxiety, hypertension, GERD who was admitted to the hospital with abdominal pain and found to have alcoholic hepatitis.  Remains hospitalized with concern for underlying cirrhosis. Hospital Day: 3  SUBJECTIVE  The patient is evaluated this evening with his wife and sister at bedside. Patient currently denies any fevers or chills. Abdominal discomfort is improving. Able to tolerate full liquid diet at this time without overt dysphagia. He tells me that within the last week he developed issues of difficulty swallowing solid foods leading to regurgitation but no hematemesis or coffee-ground emesis. Patient is adamant that he will be able to continue to cut back his alcohol consumption since he used to drink much more.   OBJECTIVE  Scheduled Inpatient Medications:   enoxaparin (LOVENOX) injection  40 mg Subcutaneous Q24H   escitalopram  15 mg Oral Daily   folic acid  1 mg Oral Daily   pantoprazole  40 mg Oral Daily   prazosin  1 mg Oral QHS   thiamine  100 mg Oral Daily   Or   thiamine  100 mg Intravenous Daily   Continuous Inpatient Infusions:  PRN Inpatient Medications: LORazepam **OR** LORazepam, methocarbamol, morphine injection, ondansetron **OR** ondansetron (ZOFRAN) IV   Physical Examination  Temp:  [98 F (36.7 C)-98.4 F (36.9 C)] 98 F (36.7 C) (06/16 2041) Pulse Rate:  [83-98] 83 (06/16 2041) Resp:  [18-20] 20 (06/16 2041) BP: (109-123)/(56-77) 123/71 (06/16 2041) SpO2:  [93 %-97 %] 96 % (06/16 2041) Temp (24hrs), Avg:98.1 F (36.7 C), Min:98 F (36.7 C), Max:98.4 F (36.9 C)    GEN: NAD, appears old than stated age, chronically ill but non-toxic PSYCH: Cooperative, without pressured speech EYE: Sclerae icteric ENT: Dry MM CV: RR without R/Gs  RESP: Decreased BS at bases bilaterally GI: NABS, soft,  protuberant, distended, TTP in upper abdomen MSK/EXT: LE edema noted SKIN: Jaundiced with spider angiomata NEURO:  Alert & Oriented x 3, no focal deficits, no evidence of asterixis   Review of Data   Laboratory Studies   Recent Labs  Lab 09/24/20 0633 09/25/20 0535  NA 133* 136  K 3.0* 3.2*  CL 92* 100  CO2 26 28  BUN 8 7  CREATININE 0.52* 0.55*  GLUCOSE 89 83  CALCIUM 8.4* 8.3*  MG 1.8  --   PHOS 4.8*  --    Recent Labs  Lab 09/25/20 0535  AST 103*  ALT 49*  ALKPHOS 181*    Recent Labs  Lab 09/23/20 1146 09/24/20 0633 09/25/20 0535  WBC 14.1* 15.2* 10.1  HGB 12.3* 11.1* 10.5*  HCT 36.6* 32.8* 31.2*  PLT 229 203 164   Recent Labs  Lab 09/23/20 1146 09/25/20 0535  INR 1.3* 1.3*   MELD-Na score: 17 at 09/25/2020  5:35 AM MELD score: 16 at 09/25/2020  5:35 AM Calculated from: Serum Creatinine: 0.55 mg/dL (Using min of 1 mg/dL) at 07/29/3788  2:40 AM Serum Sodium: 136 mmol/L at 09/25/2020  5:35 AM Total Bilirubin: 5.8 mg/dL at 9/73/5329  9:24 AM INR(ratio): 1.3 at 09/25/2020  5:35 AM Age: 13 years  Imaging Studies  DG Ribs Unilateral W/Chest Left  Result Date: 09/24/2020 CLINICAL DATA:  Pain after fall EXAM: LEFT RIBS AND CHEST - 3+ VIEW COMPARISON:  06/05/2016 FINDINGS: No fracture or other bone lesions are seen involving the ribs. There is no evidence of pneumothorax or pleural  effusion. Both lungs are clear. Heart size and mediastinal contours are within normal limits. IMPRESSION: Negative. Electronically Signed   By: Charlett Nose M.D.   On: 09/24/2020 02:35   IR ABDOMEN US LIMITED  Result Date: 09/24/2020 CLINICAL DATA:  35 year old gentleman presented for paracentesis EXAM: LIMITED ABDOMEN ULTRASOUND FOR ASCITES TECHNIQUE: Limited ultrasound survey for ascites was performed in all four abdominal quadrants. COMPARISON:  None. FINDINGS: No significant ascites. IMPRESSION: No significant ascites.  No paracentesis performed. Electronically Signed   By: Acquanetta Belling M.D.   On: 09/24/2020 13:39    GI Procedures and Studies  No relevant studies to review   ASSESSMENT  Mr. Albert [name] is a 35 y.o. male with a pmh significant for alcohol use disorder, anxiety, hypertension, GERD who was admitted to the hospital with abdominal pain and found to have alcoholic hepatitis.  Remains hospitalized with concern for underlying cirrhosis.  Patient is hemodynamically stable.  Improved since yesterday but not ready for discharge.  Long ocnversation again with family at bedside as well for complete alcohol cessation rather than just alcohol decreasing.  Discussed MELD score.  Discussed he is not a transplant candidate.  Will plan for possible EGD tomorrow or Saturday to evaluate for Varices.  NPO tonight.  The risks and benefits of endoscopic evaluation were discussed with the patient; these include but are not limited to the risk of perforation, infection, bleeding, missed lesions, lack of diagnosis, severe illness requiring hospitalization, as well as anesthesia and sedation related illnesses.  The patient and/or family is agreeable to proceed.  All patient questions were answered to the best of my ability, and the patient agrees to the aforementioned plan of action with follow-up as indicated.   PLAN/RECOMMENDATIONS  NPO at midnight tonight for possible EGD on Friday -Will know by 900AM if able to add on or not -Otherwise EGD on Saturday AM  Chronic Liver Disease Managment Volume -Obtain daily standing weight -2000 mg Na diet Infection -Not enough ascites to tap so unlikely Bleeding -EGD on Friday or Monday Encephalopathy -Not an issue currently Screening -Will need U/S in 12/22 Transplant -Not a candidate Vaccination -Will evaluate for HAV Immunity, HBV Immunity -Recommend Influenza, Pneumococcal vaccines Other -Obtain daily MELD Labs -Promote intake of 1.5 g/kg/day of Protein (Ensures/Boosts at each meal)   Please page/call with questions or  concerns.   Corliss Parish, MD [facility] Office # [phone]    LOS: 1 day  Lemar Lofty  09/25/2020, 10:17 PM

## 2020-09-25 NOTE — Progress Notes (Signed)
PROGRESS NOTE  Albert [name] EXN:[id number] DOB: [birth year] DOA: 09/23/2020 PCP: Carlean Jews, NP   LOS: 1 day   Brief narrative:  35 year old male with significant history of alcohol abuse, hypertension, presented to the hospital with worsening abdominal pain for 2 weeks with nausea vomiting diarrhea.  In the ED patient was noted to have elevated LFTs with WBC 14,000.  CT abdomen showed small volume ascites, early cirrhosis and findings suggestive of developing portal hypertension.  There was some concern for SBP, patient was started on Merrem.  Patient was then admitted to the hospital for further management.   Assessment/Plan:  Principal Problem:   SBP (spontaneous bacterial peritonitis) (HCC) Active Problems:   Alcohol abuse   Essential hypertension   Alcoholic cirrhosis of liver with ascites (HCC)   Mesenteric mass  New onset alcoholic liver cirrhosis with alcoholic hepatitis. Elevated LFTs.  Currently afebrile.  With mild leukocytosis.  Hepatitis panel negative.  CT scan of the abdomen showed small volume ascites cirrhosis with developing portal hypertension.  Ultrasound did not show significant ascites so paracentesis was done but patient was empirically treated with meropenem for SBP.  GI has been consulted.  Patient is n.p.o. today.  Patient does not meet criteria for treatment of alcoholic hepatitis.  Counseling done per quitting alcohol.  Will need endoscopic evaluation to assess for varices. Liver labs have been sent pending.  Continue PPI twice daily.  Continue thiamine multiple vitamin and folate.  Concern for SBP.  On meropenem.  Looks like anaphylaxis to penicillin.  Unable to do tapping.    Hypokalemia.  We will replenish through IV.  Check levels in a.m.   Alcohol abuse with possible withdrawal CIWA protocol will continue.  No active withdrawals at this time.  DVT prophylaxis: enoxaparin (LOVENOX) injection 40 mg Start: 09/24/20 1400   Code Status: Full  code  Family Communication: None  Status is: Inpatient  Remains inpatient appropriate because:IV treatments appropriate due to intensity of illness or inability to take PO and Inpatient level of care appropriate due to severity of illness  Dispo: The patient is from: Home              Anticipated d/c is to: Home              Patient currently is not medically stable to d/c.   Difficult to place patient No   Consultants: GI  Procedures: None yet  Anti-infectives:  Meropenem IV  Anti-infectives (From admission, onward)    Start     Dose/Rate Route Frequency Ordered Stop   09/24/20 1200  meropenem (MERREM) 1 g in sodium chloride 0.9 % 100 mL IVPB  Status:  Discontinued        1 g 200 mL/hr over 30 Minutes Intravenous Every 8 hours 09/24/20 0250 09/24/20 0300   09/24/20 0924  meropenem (MERREM) 1 g in sodium chloride 0.9 % 100 mL IVPB  Status:  Discontinued        1 g 200 mL/hr over 30 Minutes Intravenous Every 8 hours 09/24/20 0339 09/25/20 0927   09/24/20 0300  meropenem (MERREM) 1 g in sodium chloride 0.9 % 100 mL IVPB  Status:  Discontinued        1 g 200 mL/hr over 30 Minutes Intravenous  Once 09/24/20 0248 09/24/20 0250        Subjective: Today, patient was seen and examined at bedside.  Patient complains of mild abdominal discomfort.  Denies any fever chills nausea or vomiting today.  Objective: Vitals:   09/25/20 0211 09/25/20 0608  BP: 121/77 114/76  Pulse: 98 98  Resp: 19 18  Temp: 98 F (36.7 C) 98.1 F (36.7 C)  SpO2: 96% 93%    Intake/Output Summary (Last 24 hours) at 09/25/2020 1106 Last data filed at 09/25/2020 0308 Gross per 24 hour  Intake 1300 ml  Output --  Net 1300 ml   There were no vitals filed for this visit. There is no height or weight on file to calculate BMI.   Physical Exam: GENERAL: Patient is alert awake and oriented. Not in obvious distress.  Obese, nasal cannula in place. HENT: No scleral pallor or icterus. Pupils equally  reactive to light. Oral mucosa is moist NECK: is supple, no gross swelling noted. CHEST: Diminished breath sounds bilaterally.  Spider nevi present. CVS: S1 and S2 heard, no murmur. Regular rate and rhythm.  ABDOMEN: Soft, non-tender, bowel sounds are present. EXTREMITIES: Bilateral lower extremity edema. CNS: Cranial nerves are intact. No focal motor deficits. SKIN: warm and dry without rashes.  Data Review: I have personally reviewed the following laboratory data and studies,  CBC: Recent Labs  Lab 09/23/20 1146 09/24/20 0633 09/25/20 0535  WBC 14.1* 15.2* 10.1  NEUTROABS 11.4*  --   --   HGB 12.3* 11.1* 10.5*  HCT 36.6* 32.8* 31.2*  MCV 98.7 100.0 101.6*  PLT 229 203 164   Basic Metabolic Panel: Recent Labs  Lab 09/23/20 1146 09/24/20 0633 09/25/20 0535  NA 133* 133* 136  K 2.9* 3.0* 3.2*  CL 93* 92* 100  CO2 26 26 28   GLUCOSE 105* 89 83  BUN <5* 8 7  CREATININE 0.54* 0.52* 0.55*  CALCIUM 8.9 8.4* 8.3*  MG  --  1.8  --   PHOS  --  4.8*  --    Liver Function Tests: Recent Labs  Lab 09/23/20 1146 09/24/20 0633 09/25/20 0535  AST 126* 99* 103*  ALT 68* 58* 49*  ALKPHOS 212* 169* 181*  BILITOT 5.7* 6.7* 5.8*  PROT 7.3 7.1 6.1*  ALBUMIN 2.7* 2.5* 2.2*   Recent Labs  Lab 09/23/20 1146  LIPASE 23   Recent Labs  Lab 09/25/20 0535  AMMONIA 59*   Cardiac Enzymes: No results for input(s): CKTOTAL, CKMB, CKMBINDEX, TROPONINI in the last 168 hours. BNP (last 3 results) No results for input(s): BNP in the last 8760 hours.  ProBNP (last 3 results) No results for input(s): PROBNP in the last 8760 hours.  CBG: No results for input(s): GLUCAP in the last 168 hours. Recent Results (from the past 240 hour(s))  Resp Panel by RT-PCR (Flu A&B, Covid) Nasopharyngeal Swab     Status: None   Collection Time: 09/24/20  3:59 AM   Specimen: Nasopharyngeal Swab; Nasopharyngeal(NP) swabs in vial transport medium  Result Value Ref Range Status   SARS Coronavirus 2 by  RT PCR NEGATIVE NEGATIVE Final    Comment: (NOTE) SARS-CoV-2 target nucleic acids are NOT DETECTED.  The SARS-CoV-2 RNA is generally detectable in upper respiratory specimens during the acute phase of infection. The lowest concentration of SARS-CoV-2 viral copies this assay can detect is 138 copies/mL. A negative result does not preclude SARS-Cov-2 infection and should not be used as the sole basis for treatment or other patient management decisions. A negative result may occur with  improper specimen collection/handling, submission of specimen other than nasopharyngeal swab, presence of viral mutation(s) within the areas targeted by this assay, and inadequate number of viral copies(<138 copies/mL). A negative  result must be combined with clinical observations, patient history, and epidemiological information. The expected result is Negative.  Fact Sheet for Patients:  BloggerCourse.com  Fact Sheet for Healthcare Providers:  SeriousBroker.it  This test is no t yet approved or cleared by the [place] FDA and  has been authorized for detection and/or diagnosis of SARS-CoV-2 by FDA under an Emergency Use Authorization (EUA). This EUA will remain  in effect (meaning this test can be used) for the duration of the COVID-19 declaration under Section 564(b)(1) of the Act, 21 U.S.C.section 360bbb-3(b)(1), unless the authorization is terminated  or revoked sooner.       Influenza A by PCR NEGATIVE NEGATIVE Final   Influenza B by PCR NEGATIVE NEGATIVE Final    Comment: (NOTE) The Xpert Xpress SARS-CoV-2/FLU/RSV plus assay is intended as an aid in the diagnosis of influenza from Nasopharyngeal swab specimens and should not be used as a sole basis for treatment. Nasal washings and aspirates are unacceptable for Xpert Xpress SARS-CoV-2/FLU/RSV testing.  Fact Sheet for Patients: BloggerCourse.com  Fact Sheet  for Healthcare Providers: SeriousBroker.it  This test is not yet approved or cleared by the [place] FDA and has been authorized for detection and/or diagnosis of SARS-CoV-2 by FDA under an Emergency Use Authorization (EUA). This EUA will remain in effect (meaning this test can be used) for the duration of the COVID-19 declaration under Section 564(b)(1) of the Act, 21 U.S.C. section 360bbb-3(b)(1), unless the authorization is terminated or revoked.  Performed at [facility], [address] [place], [place] [address]      Studies: DG Ribs Unilateral W/Chest Left  Result Date: 09/24/2020 CLINICAL DATA:  Pain after fall EXAM: LEFT RIBS AND CHEST - 3+ VIEW COMPARISON:  06/05/2016 FINDINGS: No fracture or other bone lesions are seen involving the ribs. There is no evidence of pneumothorax or pleural effusion. Both lungs are clear. Heart size and mediastinal contours are within normal limits. IMPRESSION: Negative. Electronically Signed   By: Charlett Nose M.D.   On: 09/24/2020 02:35   CT Abdomen Pelvis W Contrast  Result Date: 09/23/2020 CLINICAL DATA:  35 year old male with history of upper abdominal pain for the past 2 months. Vomiting and diarrhea. EXAM: CT ABDOMEN AND PELVIS WITH CONTRAST TECHNIQUE: Multidetector CT imaging of the abdomen and pelvis was performed using the standard protocol following bolus administration of intravenous contrast. CONTRAST:  OMNIPAQUE IOHEXOL 300 MG/ML  SOLN COMPARISON:  None. FINDINGS: Lower chest: Trace left pleural effusion lying dependently. Hepatobiliary: Severe diffuse low attenuation throughout the hepatic parenchyma, indicative of severe hepatic steatosis. Liver also has a nodular contour, suggesting developing cirrhosis. No discrete cystic or solid hepatic lesions are confidently identified. No intra or extrahepatic biliary ductal dilatation. Gallbladder is normal in appearance. Pancreas: No pancreatic mass. No  pancreatic ductal dilatation. No pancreatic or peripancreatic fluid collections or inflammatory changes. Spleen: Spleen is enlarged measuring 16.8 x 8.4 x 14.8 cm (estimated splenic volume of 1,044 mL) . Adrenals/Urinary Tract: Bilateral kidneys and adrenal glands are normal in appearance. No hydroureteronephrosis. Urinary bladder is normal in appearance. Stomach/Bowel: The appearance of the stomach is normal. There is no pathologic dilatation of small bowel or colon. Normal appendix. Vascular/Lymphatic: No atherosclerotic calcifications in the abdominal aorta or pelvic vasculature. In the central small bowel mesentery (axial image 61 of series 3 and coronal image 66 of series 6) there is a 2.7 x 2.2 x 2.6 cm low-intermediate attenuation lesion (21 HU) which may represent an enlarged lymph  node. No other definite lymphadenopathy is noted elsewhere in the abdomen or pelvis. Reproductive: Prostate gland and seminal vesicles are unremarkable in appearance. Other: Small volume of ascites.  No pneumoperitoneum. Musculoskeletal: There are no aggressive appearing lytic or blastic lesions noted in the visualized portions of the skeleton. IMPRESSION: 1. Severe hepatic steatosis with evidence of developing cirrhosis. 2. Splenomegaly, which could suggest developing portal venous hypertension. 3. Small volume of ascites. 4. Indeterminate lesion in the small bowel mesentery which has an unusual appearance. This may represent an enlarged lymph node or other mesenteric mass such as a desmoid tumor. Repeat contrast enhanced CT the abdomen and pelvis is recommended in 3 months to ensure the stability or regression of this lesion 5. Trace left pleural effusion lying dependently. Electronically Signed   By: Trudie Reed M.D.   On: 09/23/2020 20:27   IR ABDOMEN US LIMITED  Result Date: 09/24/2020 CLINICAL DATA:  35 year old gentleman presented for paracentesis EXAM: LIMITED ABDOMEN ULTRASOUND FOR ASCITES TECHNIQUE: Limited  ultrasound survey for ascites was performed in all four abdominal quadrants. COMPARISON:  None. FINDINGS: No significant ascites. IMPRESSION: No significant ascites.  No paracentesis performed. Electronically Signed   By: Acquanetta Belling M.D.   On: 09/24/2020 13:39      Joycelyn Das, MD  [facility] 09/25/2020  If 7PM-7AM, please contact night-coverage

## 2020-09-25 NOTE — Telephone Encounter (Signed)
Patient is in the hospital and he cannot talk. Heather said to please call her if Albert [name] (PCP) has any questions. Please advise, thanks.

## 2020-09-25 NOTE — Progress Notes (Signed)
Gastroenterology Inpatient Follow-up Note   [name] IDENTIFICATION  Albert [name] is a 35 y.o. male with a pmh significant for alcohol use disorder, anxiety, hypertension, GERD who was admitted to [facility] with abdominal pain and found to have alcoholic hepatitis.  Remains hospitalized with concern for underlying cirrhosis. Hospital Day: 3  SUBJECTIVE  Albert [name] is evaluated this evening with his wife and sister at bedside. [name] currently denies any fevers or chills. Abdominal discomfort is improving. Able to tolerate full liquid diet at this time without overt dysphagia. He tells me that within Albert last week he developed issues of difficulty swallowing solid foods leading to regurgitation but no hematemesis or coffee-ground emesis. [name] is adamant that he will be able to continue to cut back his alcohol consumption since he used to drink much more.   OBJECTIVE  Scheduled Inpatient Medications:   enoxaparin (LOVENOX) injection  40 mg Subcutaneous Q24H   escitalopram  15 mg Oral Daily   folic acid  1 mg Oral Daily   pantoprazole  40 mg Oral Daily   prazosin  1 mg Oral QHS   thiamine  100 mg Oral Daily   Or   thiamine  100 mg Intravenous Daily   Continuous Inpatient Infusions:  PRN Inpatient Medications: LORazepam **OR** LORazepam, methocarbamol, morphine injection, ondansetron **OR** ondansetron (ZOFRAN) IV   Physical Examination  Temp:  [98 F (36.7 C)-98.4 F (36.9 C)] 98 F (36.7 C) (06/16 2041) Pulse Rate:  [83-98] 83 (06/16 2041) Resp:  [18-20] 20 (06/16 2041) BP: (109-123)/(56-77) 123/71 (06/16 2041) SpO2:  [93 %-97 %] 96 % (06/16 2041) Temp (24hrs), Avg:98.1 F (36.7 C), Min:98 F (36.7 C), Max:98.4 F (36.9 C)    GEN: NAD, appears older than stated age, chronically ill but nontoxic  PSYCH: Cooperative, without pressured speech EYE: Sclerae icteric ENT: MMM CV: Nontachycardic RESP: Decreased breath sounds at bases bilaterally GI: NABS, soft,  protuberant abdomen, distended, mild volitional guarding in midepigastrium, hepatosplenomegaly is appreciated  MSK/EXT: Lower extremity edema present SKIN: Jaundiced, spider angiomata present NEURO:  Alert & Oriented x 3, no focal deficits, no evidence of asterixis   Review of Data   Laboratory Studies   Recent Labs  Lab 09/24/20 0633 09/25/20 0535  NA 133* 136  K 3.0* 3.2*  CL 92* 100  CO2 26 28  BUN 8 7  CREATININE 0.52* 0.55*  GLUCOSE 89 83  CALCIUM 8.4* 8.3*  MG 1.8  --   PHOS 4.8*  --    Recent Labs  Lab 09/25/20 0535  AST 103*  ALT 49*  ALKPHOS 181*    Recent Labs  Lab 09/23/20 1146 09/24/20 0633 09/25/20 0535  WBC 14.1* 15.2* 10.1  HGB 12.3* 11.1* 10.5*  HCT 36.6* 32.8* 31.2*  PLT 229 203 164   Recent Labs  Lab 09/23/20 1146 09/25/20 0535  INR 1.3* 1.3*   MELD-Na score: 17 at 09/25/2020  5:35 AM MELD score: 16 at 09/25/2020  5:35 AM Calculated from: Serum Creatinine: 0.55 mg/dL (Using min of 1 mg/dL) at 07/03/4008  2:72 AM Serum Sodium: 136 mmol/L at 09/25/2020  5:35 AM Total Bilirubin: 5.8 mg/dL at 5/36/6440  3:47 AM INR(ratio): 1.3 at 09/25/2020  5:35 AM Age: 72 years  Imaging Studies  DG Ribs Unilateral W/Chest Left  Result Date: 09/24/2020 CLINICAL DATA:  Pain after fall EXAM: LEFT RIBS AND CHEST - 3+ VIEW COMPARISON:  06/05/2016 FINDINGS: No fracture or other bone lesions are seen involving Albert ribs. There is no  evidence of pneumothorax or pleural effusion. Both lungs are clear. Heart size and mediastinal contours are within normal limits. IMPRESSION: Negative. Electronically Signed   By: Charlett Nose M.D.   On: 09/24/2020 02:35   IR ABDOMEN US LIMITED  Result Date: 09/24/2020 CLINICAL DATA:  35 year old gentleman presented for paracentesis EXAM: LIMITED ABDOMEN ULTRASOUND FOR ASCITES TECHNIQUE: Limited ultrasound survey for ascites was performed in all four abdominal quadrants. COMPARISON:  None. FINDINGS: No significant ascites. IMPRESSION: No  significant ascites.  No paracentesis performed. Electronically Signed   By: Acquanetta Belling M.D.   On: 09/24/2020 13:39    GI Procedures and Studies  No relevant studies to review   ASSESSMENT  Albert [name] is a 35 y.o. male with a pmh significant for alcohol use disorder, anxiety, hypertension, GERD who was admitted to [facility] with abdominal pain and found to have alcoholic hepatitis.  Remains hospitalized with concern for underlying cirrhosis.  Albert [name] is hemodynamically stable and clinically improved from yesterday into today.  He has a long ways to go to get through this alcoholic hepatitis.  We are going to do a variceal screen either on Friday or Saturday depending on timings for Friday.  He will be n.p.o. at midnight with possible endoscopy at which point we will know by 9 AM on Friday morning if he can be done or not.  Supportive management and care is ideal at this time.  Hopefully will be able to get his diet and nutrition optimized.  Alcohol cessation remains of Albert utmost need.  They understand he is not a transplant candidate at this time.   PLAN/RECOMMENDATIONS  N.p.o. at midnight in small chance that we can fit him in tomorrow for EGD We will know by 9 AM whether he can have EGD on Friday or if it will be pushed to Saturday If EGD will not occur on Friday then he can be uptitrated on his diet from full liquids to heart healthy  #ESLD Management Volume -Please obtain daily standing weight -2000 mg Na diet Infection -No evidence of SBP since not enough ascites to tap Bleeding -Planning endoscopy to evaluate for potential risk Encephalopathy -Not required at this time Screening -Based on imaging will need repeat ultrasound in December 2022 Transplant -Not candidate due to active alcohol consumption Vaccination -We will evaluate for HAV Immunity, HBV Immunity -Recommend [name] get Influenza, Pneumococcal Other -Obtain daily MELD Labs -Promote intake of 1.5  g/kg/day of Protein (Ensures/Boosts at each meal)   Please page/call with questions or concerns.   Corliss Parish, MD [facility] Office # [phone]    LOS: 1 day  Lemar Lofty  09/25/2020, 10:17 PM

## 2020-09-25 NOTE — Telephone Encounter (Signed)
That's good. Could be related to gallbladder or liver. We are in process of evaluating his liver now. I'm glad he is being seen in hospital.

## 2020-09-26 ENCOUNTER — Inpatient Hospital Stay (HOSPITAL_COMMUNITY): Payer: BC Managed Care – PPO | Admitting: Anesthesiology

## 2020-09-26 ENCOUNTER — Encounter (HOSPITAL_COMMUNITY): Payer: Self-pay | Admitting: Internal Medicine

## 2020-09-26 ENCOUNTER — Encounter (HOSPITAL_COMMUNITY)
Admission: EM | Disposition: A | Discharge status: Home / Self Care | Payer: Self-pay | Source: Home / Self Care | Attending: Family Medicine

## 2020-09-26 DIAGNOSIS — I8510 Secondary esophageal varices without bleeding: Secondary | ICD-10-CM

## 2020-09-26 DIAGNOSIS — R1310 Dysphagia, unspecified: Secondary | ICD-10-CM

## 2020-09-26 HISTORY — PX: BIOPSY: SHX5522

## 2020-09-26 HISTORY — PX: ESOPHAGOGASTRODUODENOSCOPY: SHX5428

## 2020-09-26 LAB — COMPREHENSIVE METABOLIC PANEL
ALT: 51 U/L — ABNORMAL HIGH (ref 0–44)
AST: 109 U/L — ABNORMAL HIGH (ref 15–41)
Albumin: 2.2 g/dL — ABNORMAL LOW (ref 3.5–5.0)
Alkaline Phosphatase: 179 U/L — ABNORMAL HIGH (ref 38–126)
Anion gap: 9 (ref 5–15)
BUN: 8 mg/dL (ref 6–20)
CO2: 27 mmol/L (ref 22–32)
Calcium: 8.3 mg/dL — ABNORMAL LOW (ref 8.9–10.3)
Chloride: 98 mmol/L (ref 98–111)
Creatinine, Ser: 0.54 mg/dL — ABNORMAL LOW (ref 0.61–1.24)
GFR, Estimated: >60 mL/min (ref >60)
Glucose, Bld: 71 mg/dL (ref 70–99)
Potassium: 3.1 mmol/L — ABNORMAL LOW (ref 3.5–5.1)
Sodium: 134 mmol/L — ABNORMAL LOW (ref 135–145)
Total Bilirubin: 5 mg/dL — ABNORMAL HIGH (ref 0.3–1.2)
Total Protein: 5.9 g/dL — ABNORMAL LOW (ref 6.5–8.1)

## 2020-09-26 LAB — MAGNESIUM: Magnesium: 2 mg/dL (ref 1.7–2.4)

## 2020-09-26 LAB — PHOSPHORUS: Phosphorus: 2.9 mg/dL (ref 2.5–4.6)

## 2020-09-26 LAB — CBC
HCT: 30 % — ABNORMAL LOW (ref 39.0–52.0)
Hemoglobin: 10.4 g/dL — ABNORMAL LOW (ref 13.0–17.0)
MCH: 34.9 pg — ABNORMAL HIGH (ref 26.0–34.0)
MCHC: 34.7 g/dL (ref 30.0–36.0)
MCV: 100.7 fL — ABNORMAL HIGH (ref 80.0–100.0)
Platelets: 163 10*3/uL (ref 150–400)
RBC: 2.98 MIL/uL — ABNORMAL LOW (ref 4.22–5.81)
RDW: 17 % — ABNORMAL HIGH (ref 11.5–15.5)
WBC: 8.4 10*3/uL (ref 4.0–10.5)
nRBC: 0 % (ref 0.0–0.2)

## 2020-09-26 LAB — PROTIME-INR
INR: 1.4 — ABNORMAL HIGH (ref 0.8–1.2)
Prothrombin Time: 16.6 seconds — ABNORMAL HIGH (ref 11.4–15.2)

## 2020-09-26 LAB — CERULOPLASMIN: Ceruloplasmin: 25.2 mg/dL (ref 16.0–31.0)

## 2020-09-26 LAB — ALPHA-1-ANTITRYPSIN: A-1 Antitrypsin, Ser: 256 mg/dL — ABNORMAL HIGH (ref 95–164)

## 2020-09-26 LAB — ANA: Anti Nuclear Antibody (ANA): NEGATIVE

## 2020-09-26 LAB — IGG: IgG (Immunoglobin G), Serum: 1020 mg/dL (ref 603–1613)

## 2020-09-26 LAB — HEPATITIS B SURFACE ANTIBODY, QUANTITATIVE: Hep B S AB Quant (Post): 31.5 m[IU]/mL (ref >9.9)

## 2020-09-26 SURGERY — EGD (ESOPHAGOGASTRODUODENOSCOPY)
Anesthesia: Monitor Anesthesia Care

## 2020-09-26 MED ORDER — SODIUM CHLORIDE 0.9 % IV SOLN: INTRAVENOUS | Status: DC

## 2020-09-26 MED ORDER — POTASSIUM CHLORIDE 10 MEQ/100ML IV SOLN
10.0000 meq | INTRAVENOUS | Status: AC
  Administered 2020-09-26 (×3): 10 meq via INTRAVENOUS
  Filled 2020-09-26 (×2): qty 100

## 2020-09-26 MED ORDER — PROPOFOL 500 MG/50ML IV EMUL
INTRAVENOUS | Status: DC | PRN
  Administered 2020-09-26: 200 ug/kg/min via INTRAVENOUS

## 2020-09-26 MED ORDER — NADOLOL 20 MG PO TABS
20.0000 mg | ORAL_TABLET | Freq: Every day | ORAL | Status: DC
  Administered 2020-09-27 – 2020-09-28 (×2): 20 mg via ORAL
  Filled 2020-09-26 (×2): qty 1

## 2020-09-26 MED ORDER — PHENYLEPHRINE HCL (PRESSORS) 10 MG/ML IV SOLN
INTRAVENOUS | Status: DC | PRN
  Administered 2020-09-26: 80 ug via INTRAVENOUS

## 2020-09-26 MED ORDER — PROPOFOL 10 MG/ML IV BOLUS
INTRAVENOUS | Status: DC | PRN
  Administered 2020-09-26 (×2): 20 mg via INTRAVENOUS

## 2020-09-26 MED ORDER — LACTATED RINGERS IV SOLN: INTRAVENOUS | Status: DC | PRN

## 2020-09-26 MED ORDER — POTASSIUM CHLORIDE 10 MEQ/100ML IV SOLN
10.0000 meq | INTRAVENOUS | Status: AC
  Administered 2020-09-26: 10 meq via INTRAVENOUS
  Filled 2020-09-26 (×2): qty 100

## 2020-09-26 NOTE — Anesthesia Postprocedure Evaluation (Signed)
Anesthesia Post Note  Patient: Albert Hall  Procedure(s) Performed: ESOPHAGOGASTRODUODENOSCOPY (EGD) BIOPSY     Patient location during evaluation: PACU Anesthesia Type: MAC Level of consciousness: awake and alert and oriented Pain management: pain level controlled Vital Signs Assessment: post-procedure vital signs reviewed and stable Respiratory status: spontaneous breathing, nonlabored ventilation and respiratory function stable Cardiovascular status: stable and blood pressure returned to baseline Postop Assessment: no apparent nausea or vomiting Anesthetic complications: no   No notable events documented.  Last Vitals:  Vitals:   09/26/20 1217 09/26/20 1308  BP: 126/75 (!) 104/54  Pulse: 92 87  Resp: 18 18  Temp: 36.5 C 36.9 C  SpO2: 94% 95%    Last Pain:  Vitals:   09/26/20 1308  TempSrc: Axillary  PainSc: 0-No pain                 Jamari Diana,Zymier A.

## 2020-09-26 NOTE — Progress Notes (Signed)
PROGRESS NOTE  Albert Hall DSK:[id number] DOB: Dec 16, 1985 DOA: 09/23/2020 PCP: Carlean Jews, NP   LOS: 2 days   Brief narrative:  35 year old male with significant history of alcohol abuse, hypertension, presented to the hospital with worsening abdominal pain for 2 weeks with nausea vomiting diarrhea.  In the ED patient was noted to have elevated LFTs with WBC 14,000.  CT abdomen showed small volume ascites, early cirrhosis and findings suggestive of developing portal hypertension.  There was some concern for SBP, patient was started on Merrem.  Patient was then admitted to the hospital for further management.   Assessment/Plan:  Principal Problem:   SBP (spontaneous bacterial peritonitis) (HCC) Active Problems:   Alcohol abuse   Essential hypertension   Alcoholic cirrhosis of liver with ascites (HCC)   Mesenteric mass  New onset alcoholic liver cirrhosis with alcoholic hepatitis. -No significant ascites on abdominal ultrasound , GI does not think there is SBP , antibiotic discontinued  -CT scan of the abdomen showed small volume ascites cirrhosis with developing portal hypertension.   -Hepatitis panel negative.  A- 1 antitrypsin elevated at 256, ANA negative, anti-smooth muscle antibody, mitochondrial antibody in process - GI has been consulted.    Patient does not meet criteria for treatment of alcoholic hepatitis.  Counseling done per quitting alcohol.  S/p EGD on 6/17, + varices but no high risk stigmata -he is started on nadolol, he is on PPI,Continue thiamine multiple vitamin and folate. -Will follow GI recommendation    Hypokalemia.  Remain low, continue replace by IV due to npo.  Check levels in a.m.   Alcohol abuse  CIWA protocol will continue.  No active withdrawals at this time. Continue thiamine multiple vitamin and folate.  Body mass index is 35.09 kg/m.   DVT prophylaxis: enoxaparin (LOVENOX) injection 40 mg Start: 09/24/20 1400   Code Status: Full  code  Family Communication: None  Status is: Inpatient  Remains inpatient appropriate because:IV treatments appropriate due to intensity of illness or inability to take PO and Inpatient level of care appropriate due to severity of illness  Dispo: The patient is from: Home              Anticipated d/c is to: Home              possible d/c in 1-2 days,  Need GI clearance for discharge   Difficult to place patient No   Consultants: GI  Procedures: EGD on 6/17  Anti-infectives:    Anti-infectives (From admission, onward)    Start     Dose/Rate Route Frequency Ordered Stop   09/24/20 1200  meropenem (MERREM) 1 g in sodium chloride 0.9 % 100 mL IVPB  Status:  Discontinued        1 g 200 mL/hr over 30 Minutes Intravenous Every 8 hours 09/24/20 0250 09/24/20 0300   09/24/20 0924  meropenem (MERREM) 1 g in sodium chloride 0.9 % 100 mL IVPB  Status:  Discontinued        1 g 200 mL/hr over 30 Minutes Intravenous Every 8 hours 09/24/20 0339 09/25/20 0927   09/24/20 0300  meropenem (MERREM) 1 g in sodium chloride 0.9 % 100 mL IVPB  Status:  Discontinued        1 g 200 mL/hr over 30 Minutes Intravenous  Once 09/24/20 0248 09/24/20 0250        Subjective: Today, patient was seen and examined at bedside.   He is sitting up on the edge of bed,  currently denies ab pain, no n/v, no fever, reports epigastric pain after eating, he is npo today , awaiting for EGD There is no confusion, no tremor  Objective: Vitals:   09/25/20 2041 09/26/20 0504  BP: 123/71 121/77  Pulse: 83 92  Resp: 20 19  Temp: 98 F (36.7 C) 98.4 F (36.9 C)  SpO2: 96% 95%    Intake/Output Summary (Last 24 hours) at 09/26/2020 0926 Last data filed at 09/25/2020 1400 Gross per 24 hour  Intake 0 ml  Output --  Net 0 ml   There were no vitals filed for this visit. There is no height or weight on file to calculate BMI.   Physical Exam: GENERAL: Patient is alert awake and oriented. Not in obvious distress.   Obese, he is on room air HENT: No scleral pallor or icterus. Pupils equally reactive to light. Oral mucosa is moist NECK: is supple, no gross swelling noted. CHEST: Diminished breath sounds bilaterally.  Spider nevi present. CVS: S1 and S2 heard, no murmur. Regular rate and rhythm.  ABDOMEN: protuberant abdomen, Soft, non-tender, bowel sounds are present. EXTREMITIES: Bilateral lower extremity edema documented previously, today no obvious  lower extremity edema on exam today CNS: Cranial nerves are intact. No focal motor deficits. SKIN: warm and dry without rashes.  Data Review: I have personally reviewed the following laboratory data and studies,  CBC: Recent Labs  Lab 09/23/20 1146 09/24/20 0633 09/25/20 0535 09/26/20 0100  WBC 14.1* 15.2* 10.1 8.4  NEUTROABS 11.4*  --   --   --   HGB 12.3* 11.1* 10.5* 10.4*  HCT 36.6* 32.8* 31.2* 30.0*  MCV 98.7 100.0 101.6* 100.7*  PLT 229 203 164 163   Basic Metabolic Panel: Recent Labs  Lab 09/23/20 1146 09/24/20 0633 09/25/20 0535 09/26/20 0100  NA 133* 133* 136 134*  K 2.9* 3.0* 3.2* 3.1*  CL 93* 92* 100 98  CO2 26 26 28 27   GLUCOSE 105* 89 83 71  BUN <5* 8 7 8   CREATININE 0.54* 0.52* 0.55* 0.54*  CALCIUM 8.9 8.4* 8.3* 8.3*  MG  --  1.8  --  2.0  PHOS  --  4.8*  --  2.9   Liver Function Tests: Recent Labs  Lab 09/23/20 1146 09/24/20 0633 09/25/20 0535 09/26/20 0100  AST 126* 99* 103* 109*  ALT 68* 58* 49* 51*  ALKPHOS 212* 169* 181* 179*  BILITOT 5.7* 6.7* 5.8* 5.0*  PROT 7.3 7.1 6.1* 5.9*  ALBUMIN 2.7* 2.5* 2.2* 2.2*   Recent Labs  Lab 09/23/20 1146  LIPASE 23   Recent Labs  Lab 09/25/20 0535  AMMONIA 59*   Cardiac Enzymes: No results for input(s): CKTOTAL, CKMB, CKMBINDEX, TROPONINI in the last 168 hours. BNP (last 3 results) No results for input(s): BNP in the last 8760 hours.  ProBNP (last 3 results) No results for input(s): PROBNP in the last 8760 hours.  CBG: No results for input(s): GLUCAP in  the last 168 hours. Recent Results (from the past 240 hour(s))  Resp Panel by RT-PCR (Flu A&B, Covid) Nasopharyngeal Swab     Status: None   Collection Time: 09/24/20  3:59 AM   Specimen: Nasopharyngeal Swab; Nasopharyngeal(NP) swabs in vial transport medium  Result Value Ref Range Status   SARS Coronavirus 2 by RT PCR NEGATIVE NEGATIVE Final    Comment: (NOTE) SARS-CoV-2 target nucleic acids are NOT DETECTED.  The SARS-CoV-2 RNA is generally detectable in upper respiratory specimens during the acute phase of infection. The lowest  concentration of SARS-CoV-2 viral copies this assay can detect is 138 copies/mL. A negative result does not preclude SARS-Cov-2 infection and should not be used as the sole basis for treatment or other patient management decisions. A negative result may occur with  improper specimen collection/handling, submission of specimen other than nasopharyngeal swab, presence of viral mutation(s) within the areas targeted by this assay, and inadequate number of viral copies(<138 copies/mL). A negative result must be combined with clinical observations, patient history, and epidemiological information. The expected result is Negative.  Fact Sheet for Patients:  BloggerCourse.com  Fact Sheet for Healthcare Providers:  SeriousBroker.it  This test is no t yet approved or cleared by the [place] FDA and  has been authorized for detection and/or diagnosis of SARS-CoV-2 by FDA under an Emergency Use Authorization (EUA). This EUA will remain  in effect (meaning this test can be used) for the duration of the COVID-19 declaration under Section 564(b)(1) of the Act, 21 U.S.C.section 360bbb-3(b)(1), unless the authorization is terminated  or revoked sooner.       Influenza A by PCR NEGATIVE NEGATIVE Final   Influenza B by PCR NEGATIVE NEGATIVE Final    Comment: (NOTE) The Xpert Xpress SARS-CoV-2/FLU/RSV plus assay is  intended as an aid in the diagnosis of influenza from Nasopharyngeal swab specimens and should not be used as a sole basis for treatment. Nasal washings and aspirates are unacceptable for Xpert Xpress SARS-CoV-2/FLU/RSV testing.  Fact Sheet for Patients: BloggerCourse.com  Fact Sheet for Healthcare Providers: SeriousBroker.it  This test is not yet approved or cleared by the [place] FDA and has been authorized for detection and/or diagnosis of SARS-CoV-2 by FDA under an Emergency Use Authorization (EUA). This EUA will remain in effect (meaning this test can be used) for the duration of the COVID-19 declaration under Section 564(b)(1) of the Act, 21 U.S.C. section 360bbb-3(b)(1), unless the authorization is terminated or revoked.  Performed at [facility], [address], [place], [place] [address]      Studies: IR ABDOMEN US LIMITED  Result Date: 09/24/2020 CLINICAL DATA:  35 year old gentleman presented for paracentesis EXAM: LIMITED ABDOMEN ULTRASOUND FOR ASCITES TECHNIQUE: Limited ultrasound survey for ascites was performed in all four abdominal quadrants. COMPARISON:  None. FINDINGS: No significant ascites. IMPRESSION: No significant ascites.  No paracentesis performed. Electronically Signed   By: Acquanetta Belling M.D.   On: 09/24/2020 13:39      Albertine Grates, MD PHD FACP [facility] 09/26/2020  If 7PM-7AM, please contact night-coverage

## 2020-09-26 NOTE — Anesthesia Preprocedure Evaluation (Addendum)
Anesthesia Evaluation  Patient identified by MRN, date of birth, ID band Patient awake    Reviewed: Allergy & Precautions, NPO status , Patient's Chart, lab work & pertinent test results, reviewed documented beta blocker date and time   Airway Mallampati: III  TM Distance: >3 FB Neck ROM: Full    Dental no notable dental hx. (+) Dental Advisory Given, Caps   Pulmonary Current Smoker and Patient abstained from smoking.,    Pulmonary exam normal breath sounds clear to auscultation       Cardiovascular hypertension, Pt. on medications Normal cardiovascular exam Rhythm:Regular Rate:Normal     Neuro/Psych PSYCHIATRIC DISORDERS Anxiety PTSDnegative neurological ROS     GI/Hepatic GERD  Medicated,(+) Cirrhosis     substance abuse  alcohol use, Elevated LFT's   Endo/Other  Obesity  Renal/GU negative Renal ROS  negative genitourinary   Musculoskeletal negative musculoskeletal ROS (+)   Abdominal (+) + obese,   Peds  Hematology  (+) anemia ,   Anesthesia Other Findings   Reproductive/Obstetrics                            Anesthesia Physical Anesthesia Plan  ASA: 2  Anesthesia Plan: MAC   Post-op Pain Management:    Induction: Intravenous  PONV Risk Score and Plan: 1 and Propofol infusion and Treatment may vary due to age or medical condition  Airway Management Planned: Natural Airway and Nasal Cannula  Additional Equipment:   Intra-op Plan:   Post-operative Plan:   Informed Consent: I have reviewed the patients History and Physical, chart, labs and discussed the procedure including the risks, benefits and alternatives for the proposed anesthesia with the patient or authorized representative who has indicated his/her understanding and acceptance.     Dental advisory given  Plan Discussed with: Anesthesiologist and CRNA  Anesthesia Plan Comments:         Anesthesia Quick  Evaluation

## 2020-09-26 NOTE — TOC Initial Note (Signed)
Transition of Care [facility]) - Initial/Assessment Note    Patient Details  Name: Albert Hall MRN: [id number] Date of Birth: 1985/11/19  Transition of Care [facility]) CM/SW Contact:    Bethann Berkshire, [place] Phone Number: 09/26/2020, 4:09 PM  Clinical Narrative:                  CSW met with pt for substance use consult. Met pt and pt wife bedside. Pt agreeable to discuss with wife present and participating. Pt lives at home with wife. He works at a [employer], [employer]. Pt states he drinks around half a fifth of liquor per day but has been attempting to reduce over the last two weeks. He was able to reduce to around a quarter of a fifth per day. He reports no previous alcohol use tx. He is agreeable to discuss different tx options. CSW provides resources list and provides education on the different levels of care including residential, IOP, and community/peer support such as AA. Pt and pt wife have no further questions and are encouraged to ask for social work if questions arise.   Expected Discharge Plan: Home/Self Care Barriers to Discharge: Continued Medical Work up   Patient Goals and CMS Choice        Expected Discharge Plan and Services Expected Discharge Plan: Home/Self Care       Living arrangements for the past 2 months: Single Family Home                                      Prior Living Arrangements/Services Living arrangements for the past 2 months: Single Family Home Lives with:: Spouse          Need for Family Participation in Patient Care: No (Comment) Care giver support system in place?: Yes (comment)      Activities of Daily Living Home Assistive Devices/Equipment: None ADL Screening (condition at time of admission) Patient's cognitive ability adequate to safely complete daily activities?: Yes Is the patient deaf or have difficulty hearing?: No Does the patient have difficulty concentrating, remembering, or making decisions?: No Patient able to express need  for assistance with ADLs?: Yes Does the patient have difficulty dressing or bathing?: No Independently performs ADLs?: Yes (appropriate for developmental age) Does the patient have difficulty walking or climbing stairs?: No Weakness of Legs: None Weakness of Arms/Hands: None  Permission Sought/Granted                  Emotional Assessment Appearance:: Appears stated age Attitude/Demeanor/Rapport: Lethargic Affect (typically observed): Blunt Orientation: : Oriented to Self, Oriented to Place, Oriented to Situation Alcohol / Substance Use: Alcohol Use Psych Involvement: No (comment)  Admission diagnosis:  Generalized abdominal pain [R10.84] SBP (spontaneous bacterial peritonitis) ([place]) [Y51.1] Alcoholic cirrhosis of liver with ascites (HCC) [K70.31] Ascites [R18.8] Intractable vomiting with nausea, unspecified vomiting type [R11.2] Patient Active Problem List   Diagnosis Date Noted   Alcoholic cirrhosis of liver with ascites ([place]) 09/24/2020   SBP (spontaneous bacterial peritonitis) ([place]) 09/24/2020   Mesenteric mass 09/24/2020   Elevated liver function tests 09/12/2020   Other ascites 09/12/2020   Encounter to establish care 08/30/2020   Essential hypertension 08/30/2020   Adjustment disorder with depressed mood 11/15/2017   Musculoskeletal chest pain 10/10/2017   Emotional crisis, acute reaction to stress 10/10/2017   Chronic post-traumatic stress disorder (PTSD)- history from being active duty 10/10/2017   Alcohol abuse  10/10/2017   Insomnia due to psychological stress 10/10/2017   Elevated blood pressure, situational- likely due to stress ( OK at home when pt checks it- usually less than 130/80 ) 10/10/2017   Vitamin D deficiency 12/08/2015   fatigue and daytime Somnolence 12/03/2015   BMI >er 30 11/15/2015   mild Fatigue 11/11/2015   Stressful life event affecting family 09/16/2015   Gastroesophageal reflux disease    Tobacco abuse 02/11/2014   H/o Insomnia  01/08/2014   Generalized anxiety disorder/ PTSD 01/08/2014   PCP:  Ronnell Freshwater, NP Pharmacy:   [facility], [address] [place] [address] Phone: [phone] Fax: [phone]     Social Determinants of Health (SDOH) Interventions    Readmission Risk Interventions No flowsheet data found.

## 2020-09-26 NOTE — Anesthesia Procedure Notes (Signed)
Procedure Name: MAC Date/Time: 09/26/2020 12:45 PM Performed by: Gaylene Brooks, CRNA Pre-anesthesia Checklist: Patient identified, Emergency Drugs available, Suction available and Patient being monitored Oxygen Delivery Method: Nasal cannula Placement Confirmation: positive ETCO2

## 2020-09-26 NOTE — Transfer of Care (Signed)
Immediate Anesthesia Transfer of Care Note  Patient: Albert Hall  Procedure(s) Performed: ESOPHAGOGASTRODUODENOSCOPY (EGD) BIOPSY  Patient Location: Endoscopy Unit  Anesthesia Type:MAC  Level of Consciousness: awake, alert  and patient cooperative  Airway & Oxygen Therapy: Patient Spontanous Breathing and Patient connected to nasal cannula oxygen  Post-op Assessment: Report given to RN, Post -op Vital signs reviewed and stable and Patient moving all extremities X 4  Post vital signs: Reviewed and stable  Last Vitals:  Vitals Value Taken Time  BP    Temp    Pulse    Resp    SpO2      Last Pain:  Vitals:   09/26/20 1217  TempSrc: Oral  PainSc: 0-No pain      Patients Stated Pain Goal: 3 (81/84/03 7543)  Complications: No notable events documented.

## 2020-09-26 NOTE — Op Note (Signed)
[facility] Patient Name: Albert Hall Procedure Date : 09/26/2020 MRN: [id number] Attending MD: Justice Britain , MD Date of Birth: [birth year] CSN: [id number] Age: 35 Admit Type: Inpatient Procedure:                Upper GI endoscopy Indications:              Screening procedure, Dysphagia, Portal hypertension                            rule out esophageal varices Providers:                Justice Britain, MD, Kary Kos RN, RN,                            Tyrone Apple, Technician Referring MD:             [facility] Medicines:                Monitored Anesthesia Care Complications:            No immediate complications. Estimated Blood Loss:     Estimated blood loss was minimal. Procedure:                Pre-Anesthesia Assessment:                           - Prior to the procedure, a History and Physical                            was performed, and patient medications and                            allergies were reviewed. The patient's tolerance of                            previous anesthesia was also reviewed. The risks                            and benefits of the procedure and the sedation                            options and risks were discussed with the patient.                            All questions were answered, and informed consent                            was obtained. Prior Anticoagulants: The patient has                            taken no previous anticoagulant or antiplatelet                            agents. ASA Grade Assessment: III - A patient with  severe systemic disease. After reviewing the risks                            and benefits, the patient was deemed in                            satisfactory condition to undergo the procedure.                           After obtaining informed consent, the endoscope was                            passed under direct vision. Throughout the                             procedure, the patient's blood pressure, pulse, and                            oxygen saturations were monitored continuously. The                            [id number]-H190 ([id number] Olympus gastroscope was                            introduced through the mouth, and advanced to the                            second part of duodenum. The upper GI endoscopy was                            accomplished without difficulty. The patient                            tolerated the procedure. Scope In: Scope Out: Findings:      No gross lesions were noted in the proximal esophagus and in the mid       esophagus.      Grade I and grade II varices were found in the distal esophagus - no       evidence of high risk stigmata.      The Z-line was irregular and was found 44 cm from the incisors.      Moderate portal hypertensive gastropathy was found in the cardia, in the       gastric fundus and in the gastric body.      Striped mildly erythematous mucosa without bleeding was found in the       gastric antrum.      No other gross lesions (including no gastric varices) were noted in the       entire examined stomach. Biopsies were taken with a cold forceps for       histology and Helicobacter pylori testing.      No gross lesions were noted in the duodenal bulb, in the first portion       of the duodenum and in the second portion of the duodenum. Impression:               - No gross  lesions in esophagus proximally.                           - Grade I and grade II esophageal varices noted                            distally.                           - Z-line irregular, 44 cm from the incisors.                           - No gastric varices.                           - Portal hypertensive gastropathy noted.                           - Erythematous mucosa in the antrum noted.                           - Stomach biopsied for HP.                           - No gross lesions in the duodenal  bulb, in the                            first portion of the duodenum and in the second                            portion of the duodenum. Recommendation:           - The patient will be observed post-procedure,                            until all discharge criteria are met.                           - Return patient to hospital ward for ongoing care.                           - Resume previous diet.                           - Continue PPI 40 mg daily.                           - If HRs allow, would like to start Nadolol 20 mg                            daily tomorrow AM (order placed already) for                            primary prophylaxis of bleeding from varices. He  has candidate varices for banding if he cannot                            tolerate NSBB however.                           - Observe patient's clinical course.                           - Await pathology results.                           - Repeat upper endoscopy in 1 year for surveillance                            (with hope patient has been abstinent of alcohol to                            see if varices still present or if the portal                            hypertension has improved).                           - The findings and recommendations were discussed                            with the patient.                           - The findings and recommendations were discussed                            with the patient's family.                           - The findings and recommendations were discussed                            with the referring physician. Procedure Code(s):        --- Professional ---                           [phone], Esophagogastroduodenoscopy, flexible,                            transoral; with biopsy, single or multiple Diagnosis Code(s):        --- Professional ---                           K76.6, Portal hypertension                           I85.10,  Secondary esophageal varices without  bleeding                           K22.8, Other specified diseases of esophagus                           K31.89, Other diseases of stomach and duodenum                           Z13.810, Encounter for screening for upper                            gastrointestinal disorder                           R13.10, Dysphagia, unspecified CPT copyright 2019 American Medical Association. All rights reserved. The codes documented in this report are preliminary and upon coder review may  be revised to meet current compliance requirements. Justice Britain, MD 09/26/2020 1:10:24 PM Number of Addenda: 0

## 2020-09-27 ENCOUNTER — Inpatient Hospital Stay (HOSPITAL_COMMUNITY): Payer: BC Managed Care – PPO

## 2020-09-27 LAB — CBC WITH DIFFERENTIAL/PLATELET
Abs Immature Granulocytes: 0.18 10*3/uL — ABNORMAL HIGH (ref 0.00–0.07)
Basophils Absolute: 0.1 10*3/uL (ref 0.0–0.1)
Basophils Relative: 1 %
Eosinophils Absolute: 0.1 10*3/uL (ref 0.0–0.5)
Eosinophils Relative: 1 %
HCT: 30.9 % — ABNORMAL LOW (ref 39.0–52.0)
Hemoglobin: 10.3 g/dL — ABNORMAL LOW (ref 13.0–17.0)
Immature Granulocytes: 2 %
Lymphocytes Relative: 14 %
Lymphs Abs: 1.1 10*3/uL (ref 0.7–4.0)
MCH: 34.2 pg — ABNORMAL HIGH (ref 26.0–34.0)
MCHC: 33.3 g/dL (ref 30.0–36.0)
MCV: 102.7 fL — ABNORMAL HIGH (ref 80.0–100.0)
Monocytes Absolute: 0.9 10*3/uL (ref 0.1–1.0)
Monocytes Relative: 11 %
Neutro Abs: 5.9 10*3/uL (ref 1.7–7.7)
Neutrophils Relative %: 71 %
Platelets: 161 10*3/uL (ref 150–400)
RBC: 3.01 MIL/uL — ABNORMAL LOW (ref 4.22–5.81)
RDW: 17.2 % — ABNORMAL HIGH (ref 11.5–15.5)
WBC: 8.3 10*3/uL (ref 4.0–10.5)
nRBC: 0 % (ref 0.0–0.2)

## 2020-09-27 LAB — COMPREHENSIVE METABOLIC PANEL
ALT: 54 U/L — ABNORMAL HIGH (ref 0–44)
AST: 125 U/L — ABNORMAL HIGH (ref 15–41)
Albumin: 2.2 g/dL — ABNORMAL LOW (ref 3.5–5.0)
Alkaline Phosphatase: 179 U/L — ABNORMAL HIGH (ref 38–126)
Anion gap: 7 (ref 5–15)
BUN: 6 mg/dL (ref 6–20)
CO2: 27 mmol/L (ref 22–32)
Calcium: 8.5 mg/dL — ABNORMAL LOW (ref 8.9–10.3)
Chloride: 102 mmol/L (ref 98–111)
Creatinine, Ser: 0.56 mg/dL — ABNORMAL LOW (ref 0.61–1.24)
GFR, Estimated: >60 mL/min (ref >60)
Glucose, Bld: 76 mg/dL (ref 70–99)
Potassium: 3.3 mmol/L — ABNORMAL LOW (ref 3.5–5.1)
Sodium: 136 mmol/L (ref 135–145)
Total Bilirubin: 4 mg/dL — ABNORMAL HIGH (ref 0.3–1.2)
Total Protein: 6.2 g/dL — ABNORMAL LOW (ref 6.5–8.1)

## 2020-09-27 LAB — PROTIME-INR
INR: 1.3 — ABNORMAL HIGH (ref 0.8–1.2)
Prothrombin Time: 16.5 seconds — ABNORMAL HIGH (ref 11.4–15.2)

## 2020-09-27 LAB — VITAMIN B12: Vitamin B-12: 889 pg/mL (ref 180–914)

## 2020-09-27 LAB — MITOCHONDRIAL ANTIBODIES: Mitochondrial M2 Ab, IgG: <20 Units (ref 0.0–20.0)

## 2020-09-27 LAB — FOLATE: Folate: 6.3 ng/mL (ref >5.9)

## 2020-09-27 LAB — ANTI-SMOOTH MUSCLE ANTIBODY, IGG: F-Actin IgG: 18 Units (ref 0–19)

## 2020-09-27 MED ORDER — ALPRAZOLAM 0.25 MG PO TABS
0.2500 mg | ORAL_TABLET | Freq: Once | ORAL | Status: AC
  Administered 2020-09-27: 0.25 mg via ORAL
  Filled 2020-09-27: qty 1

## 2020-09-27 MED ORDER — PHYTONADIONE 5 MG PO TABS
10.0000 mg | ORAL_TABLET | Freq: Once | ORAL | Status: AC
  Administered 2020-09-27: 10 mg via ORAL
  Filled 2020-09-27: qty 2

## 2020-09-27 MED ORDER — POTASSIUM CHLORIDE CRYS ER 20 MEQ PO TBCR
40.0000 meq | EXTENDED_RELEASE_TABLET | Freq: Once | ORAL | Status: AC
  Administered 2020-09-27: 40 meq via ORAL
  Filled 2020-09-27: qty 2

## 2020-09-27 MED ORDER — POLYETHYLENE GLYCOL 3350 17 G PO PACK: 17.0000 g | PACK | Freq: Every day | ORAL | Status: DC | PRN

## 2020-09-27 NOTE — Progress Notes (Addendum)
Pt c/o anxiety, notified on call X. Blount,NP via text page with new order  received Xanax 0.25mg  PO x1.

## 2020-09-27 NOTE — Progress Notes (Addendum)
Gastroenterology Inpatient Follow-up Note   PATIENT IDENTIFICATION  Albert Hall is a 35 y.o. male with a pmh significant for alcohol use disorder, anxiety, hypertension, GERD who was admitted to the hospital with abdominal pain and found to have alcoholic hepatitis.  Remains hospitalized with concern for underlying cirrhosis. Hospital Day: 5  SUBJECTIVE  Patient is evaluated this morning and doing relatively well.  He is alone today.  We went over the results of his endoscopy once again.  Patient did not have a good dinner experience but is hopeful that his breakfast experience will be better.  Her abdomen feels more distended to him than prior.  He is passing bowel movements.  His liver biochemical testing was reviewed with him and showing an improvement day by day.  Still describing some slowness of passage of foodstuffs from his esophagus into his stomach.  No progressive abdominal pain however.  He remains adamant that he will be abstinent of alcohol moving forward.   OBJECTIVE  Scheduled Inpatient Medications:   enoxaparin (LOVENOX) injection  40 mg Subcutaneous Q24H   escitalopram  15 mg Oral Daily   folic acid  1 mg Oral Daily   nadolol  20 mg Oral Daily   pantoprazole  40 mg Oral Daily   prazosin  1 mg Oral QHS   thiamine  100 mg Oral Daily   Or   thiamine  100 mg Intravenous Daily   Continuous Inpatient Infusions:  PRN Inpatient Medications: methocarbamol, morphine injection, ondansetron **OR** ondansetron (ZOFRAN) IV, polyethylene glycol   Physical Examination  Temp:  [97.8 F (36.6 C)-98.4 F (36.9 C)] 97.9 F (36.6 C) (06/18 0515) Pulse Rate:  [84-95] 88 (06/18 0515) Resp:  [15-20] 19 (06/18 0515) BP: (104-124)/(54-81) 124/81 (06/18 0515) SpO2:  [91 %-97 %] 92 % (06/18 0515) Temp (24hrs), Avg:98 F (36.7 C), Min:97.8 F (36.6 C), Max:98.4 F (36.9 C)  Weight: 120.7 kg GEN: NAD, nontoxic, PSYCH: Cooperative, without pressured speech EYE: Sclerae  icteric ENT: MMM CV: Nontachycardic RESP: Decreased breath sounds at bases bilaterally GI: NABS, soft, protuberant abdomen, distended, mild volitional guarding in midepigastrium MSK/EXT: Trace lower extremity edema present SKIN: Jaundiced, spider angiomata present NEURO:  Alert & Oriented x 3, no focal deficits, no evidence of asterixis   Review of Data   Laboratory Studies   Recent Labs  Lab 09/24/20 0633 09/25/20 0535 09/26/20 0100 09/27/20 0158  NA 133*   < > 134* 136  K 3.0*   < > 3.1* 3.3*  CL 92*   < > 98 102  CO2 26   < > 27 27  BUN 8   < > 8 6  CREATININE 0.52*   < > 0.54* 0.56*  GLUCOSE 89   < > 71 76  CALCIUM 8.4*   < > 8.3* 8.5*  MG 1.8  --  2.0  --   PHOS 4.8*  --  2.9  --    < > = values in this interval not displayed.   Recent Labs  Lab 09/27/20 0158  AST 125*  ALT 54*  ALKPHOS 179*    Recent Labs  Lab 09/25/20 0535 09/26/20 0100 09/27/20 0158  WBC 10.1 8.4 8.3  HGB 10.5* 10.4* 10.3*  HCT 31.2* 30.0* 30.9*  PLT 164 163 161   Recent Labs  Lab 09/25/20 0535 09/26/20 0100 09/27/20 0158  INR 1.3* 1.4* 1.3*   MELD-Na score: 16 at 09/27/2020  1:58 AM MELD score: 15 at 09/27/2020  1:58 AM Calculated  from: Serum Creatinine: 0.56 mg/dL (Using min of 1 mg/dL) at 09/29/5091  2:67 AM Serum Sodium: 136 mmol/L at 09/27/2020  1:58 AM Total Bilirubin: 4.0 mg/dL at 05/06/5807  9:83 AM INR(ratio): 1.3 at 09/27/2020  1:58 AM Age: 56 years  Imaging Studies  No results found.  GI Procedures and Studies  EGD - No gross lesions in esophagus proximally. - Grade I and grade II esophageal varices noted distally. - Z-line irregular, 44 cm from the incisors. - No gastric varices. - Portal hypertensive gastropathy noted. - Erythematous mucosa in the antrum noted. - Stomach biopsied for HP. - No gross lesions in the duodenal bulb, in the first portion of the duodenum and in the second portion of the duodenum.  ASSESSMENT  Mr. Erbes is a 35 y.o. male with a  pmh significant for alcohol use disorder, anxiety, hypertension, GERD who was admitted to the hospital with abdominal pain and found to have alcoholic hepatitis.  Remains hospitalized with concern for underlying cirrhosis.  Overall, the patient is clinically improving.  I do not believe that the patient likely has developed significant ascites over the course of the last 3 days but since he feels his abdomen is more distended we will obtain a KUB and a repeat limited ultrasound to evaluate for any ascites that may need tapping.  He may maintain his diet otherwise.  Continued discussions for alcohol cessation sedation were had today.  Also discussed importance of high protein in diet over the coming weeks in order to optimize nutrition from a liver standpoint of at least 1 g/kg to 1.5 g/kg of protein. If the patient is otherwise doing well he could be able to be discharged into this evening or early tomorrow if his KUB and abdominal ultrasound are unremarkable.  I will work on arranging laboratories in my office that he can have done next week on Thursday or Friday but he would like his labs to be done at his PCP office which is closer for him and that will need to be arranged by the medicine service otherwise.  He will need follow-up to see how his heart rates and blood pressures are doing as we initiate nadolol at this time and uptitrate as able over time.  All patient questions were answered to the best of my ability, and the patient agrees to the aforementioned plan of action with follow-up as indicated.   PLAN/RECOMMENDATIONS  KUB to be obtained this morning Abdominal ultrasound to evaluate for potential ascites redevelopment to be obtained PPI 40 mg daily  #ESLD Management Volume -Please obtain daily standing weight while in-house and then every other day at home - 1500 mg Na diet Infection -Do not believe he will have any ascites but will recheck ultrasound today to ensure no evidence of  SBP Bleeding -Nadolol 20 mg daily initiated today due to finding of grade 1 and grade 2 varices Encephalopathy -Not required at this time Screening -Based on imaging will need repeat ultrasound in December 2022 Transplant -Not candidate due to active alcohol consumption Vaccination -Hepatitis A immune, will need hepatitis B vaccination -Recommend patient get Influenza, Pneumococcal Other -Obtain daily MELD Labs -Promote intake of 100 to 125 g of protein daily  CBC/CMP/INR should be obtained within 1 week of discharge (I will place an order for my office to have this done but the patient wants to have it done closer to home with his PCP so we will defer medicine service to help arrange that)  Follow-up in GI  clinic in the next 3 to 5 weeks will be arranged  If the patient is doing well and eating this evening he will be able to be discharged from a GI standpoint.  If the patient remains in-house through Monday we will return at that time  Please page/call with questions or concerns.   Corliss Parish, MD [facility] Office # [phone]    LOS: 3 days  Lemar Lofty  09/27/2020, 12:32 PM

## 2020-09-27 NOTE — Progress Notes (Signed)
PROGRESS NOTE  Albert Hall DDU:[id number] DOB: 1985/12/05 DOA: 09/23/2020 PCP: Carlean Jews, NP  HPI/Recap of past 36 hours: 35 year old male with significant history of alcohol abuse hypertension morbid obesity who presented to the hospital with worsening abdominal pain over the 2 weeks with nausea vomiting and diarrhea in the emergency room he was noted to have elevated LFT and leukocytosis.  CT abdomen showed small volume ascites and early signs of liver cirrhosis and findings suggestive of developing portal hypertension.  There was concern for spontaneous bacterial peritonitis and so he was started on meropenem.  Patient seen and examined at bedside with his wife Herbert Seta in the room.  He is just come from ultrasound  Assessment/Plan: Principal Problem:   SBP (spontaneous bacterial peritonitis) (HCC) Active Problems:   Alcohol abuse   Essential hypertension   Alcoholic cirrhosis of liver with ascites (HCC)   Mesenteric mass  1.  New onset alcoholic liver cirrhosis with alcoholic hepatitis with LFTs were elevated. His for 1 antitrypsin was initially elevated when he was admitted but today it is normalized at 8  2.  Hypokalemia that has been replaced  3.  Alcohol abuse. CIWA protocol is in place it is normal Patient advised to desist from alcohol use  4.  Morbid obesity with BMI of 35  5.  Hypertension.  Continue nadolol and Minipress  6.  Esophageal varices without high risk stigmata patient underwent EGD on September 26, 2020.  He will be continued on nadolol and PPI as well as thiamine and folate   Code Status: Full  Severity of Illness: The appropriate patient status for this patient is INPATIENT. Inpatient status is judged to be reasonable and necessary in order to provide the required intensity of service to ensure the patient's safety. The patient's presenting symptoms, physical exam findings, and initial radiographic and laboratory data in the context of their  chronic comorbidities is felt to place them at high risk for further clinical deterioration. Furthermore, it is not anticipated that the patient will be medically stable for discharge from the hospital within 2 midnights of admission. The following factors support the patient status of inpatient.   " Patient with possible spontaneous peritonitis   * I certify that at the point of admission it is my clinical judgment that the patient will require inpatient hospital care spanning beyond 2 midnights from the point of admission due to high intensity of service, high risk for further deterioration and high frequency of surveillance required.*   Family Communication: Wife at bedside Ms. Heather  Disposition Plan: Home when stable   Consultants: Gastroenterology  Procedures: EGD 09/26/2020  Antimicrobials: None  DVT prophylaxis: Lovenox   Objective: Vitals:   09/26/20 1405 09/26/20 2200 09/26/20 2200 09/27/20 0515  BP: 124/78 124/76 124/76 124/81  Pulse: 88 95 92 88  Resp: 18 20 20 19   Temp: 98.2 F (36.8 C) 97.8 F (36.6 C) 97.8 F (36.6 C) 97.9 F (36.6 C)  TempSrc: Oral Oral Oral Oral  SpO2: 95% 91% 91% 92%  Weight:      Height:        Intake/Output Summary (Last 24 hours) at 09/27/2020 1101 Last data filed at 09/26/2020 2130 Gross per 24 hour  Intake 220 ml  Output --  Net 220 ml   Filed Weights   09/26/20 1217  Weight: 120.7 kg   Body mass index is 35.09 kg/m.  Exam:  General: 35 y.o. year-old male well developed well nourished in no acute distress.  Alert and oriented x3. Cardiovascular: Regular rate and rhythm with no rubs or gallops.  No thyromegaly or JVD noted.   Respiratory: Clear to auscultation with no wheezes or rales. Good inspiratory effort. Abdomen: Soft nontender nondistended with normal bowel sounds x4 quadrants. Musculoskeletal: No lower extremity edema. 2/4 pulses in all 4 extremities. Skin: No ulcerative lesions noted or rashes, Psychiatry:  Mood is appropriate for condition and setting    Data Reviewed: CBC: Recent Labs  Lab 09/23/20 1146 09/24/20 0633 09/25/20 0535 09/26/20 0100 09/27/20 0158  WBC 14.1* 15.2* 10.1 8.4 8.3  NEUTROABS 11.4*  --   --   --  5.9  HGB 12.3* 11.1* 10.5* 10.4* 10.3*  HCT 36.6* 32.8* 31.2* 30.0* 30.9*  MCV 98.7 100.0 101.6* 100.7* 102.7*  PLT 229 203 164 163 161   Basic Metabolic Panel: Recent Labs  Lab 09/23/20 1146 09/24/20 0633 09/25/20 0535 09/26/20 0100 09/27/20 0158  NA 133* 133* 136 134* 136  K 2.9* 3.0* 3.2* 3.1* 3.3*  CL 93* 92* 100 98 102  CO2 26 26 28 27 27   GLUCOSE 105* 89 83 71 76  BUN <5* 8 7 8 6   CREATININE 0.54* 0.52* 0.55* 0.54* 0.56*  CALCIUM 8.9 8.4* 8.3* 8.3* 8.5*  MG  --  1.8  --  2.0  --   PHOS  --  4.8*  --  2.9  --    GFR: Estimated Creatinine Clearance: 177 mL/min (A) (by C-G formula based on SCr of 0.56 mg/dL (L)). Liver Function Tests: Recent Labs  Lab 09/23/20 1146 09/24/20 0633 09/25/20 0535 09/26/20 0100 09/27/20 0158  AST 126* 99* 103* 109* 125*  ALT 68* 58* 49* 51* 54*  ALKPHOS 212* 169* 181* 179* 179*  BILITOT 5.7* 6.7* 5.8* 5.0* 4.0*  PROT 7.3 7.1 6.1* 5.9* 6.2*  ALBUMIN 2.7* 2.5* 2.2* 2.2* 2.2*   Recent Labs  Lab 09/23/20 1146  LIPASE 23   Recent Labs  Lab 09/25/20 0535  AMMONIA 59*   Coagulation Profile: Recent Labs  Lab 09/23/20 1146 09/25/20 0535 09/26/20 0100 09/27/20 0158  INR 1.3* 1.3* 1.4* 1.3*   Cardiac Enzymes: No results for input(s): CKTOTAL, CKMB, CKMBINDEX, TROPONINI in the last 168 hours. BNP (last 3 results) No results for input(s): PROBNP in the last 8760 hours. HbA1C: No results for input(s): HGBA1C in the last 72 hours. CBG: No results for input(s): GLUCAP in the last 168 hours. Lipid Profile: No results for input(s): CHOL, HDL, LDLCALC, TRIG, CHOLHDL, LDLDIRECT in the last 72 hours. Thyroid Function Tests: No results for input(s): TSH, T4TOTAL, FREET4, T3FREE, THYROIDAB in the last 72  hours. Anemia Panel: Recent Labs    09/25/20 0535  FERRITIN 323  TIBC 185*  IRON 58   Urine analysis:    Component Value Date/Time   COLORURINE AMBER (A) 09/24/2020 1550   APPEARANCEUR CLEAR 09/24/2020 1550   LABSPEC 1.016 09/24/2020 1550   PHURINE 6.0 09/24/2020 1550   GLUCOSEU NEGATIVE 09/24/2020 1550   HGBUR NEGATIVE 09/24/2020 1550   BILIRUBINUR SMALL (A) 09/24/2020 1550   KETONESUR NEGATIVE 09/24/2020 1550   PROTEINUR NEGATIVE 09/24/2020 1550   NITRITE NEGATIVE 09/24/2020 1550   LEUKOCYTESUR NEGATIVE 09/24/2020 1550   Sepsis Labs: @LABRCNTIP (procalcitonin:4,lacticidven:4)  ) Recent Results (from the past 240 hour(s))  Resp Panel by RT-PCR (Flu A&B, Covid) Nasopharyngeal Swab     Status: None   Collection Time: 09/24/20  3:59 AM   Specimen: Nasopharyngeal Swab; Nasopharyngeal(NP) swabs in vial transport medium  Result Value Ref Range  Status   SARS Coronavirus 2 by RT PCR NEGATIVE NEGATIVE Final    Comment: (NOTE) SARS-CoV-2 target nucleic acids are NOT DETECTED.  The SARS-CoV-2 RNA is generally detectable in upper respiratory specimens during the acute phase of infection. The lowest concentration of SARS-CoV-2 viral copies this assay can detect is 138 copies/mL. A negative result does not preclude SARS-Cov-2 infection and should not be used as the sole basis for treatment or other patient management decisions. A negative result may occur with  improper specimen collection/handling, submission of specimen other than nasopharyngeal swab, presence of viral mutation(s) within the areas targeted by this assay, and inadequate number of viral copies(<138 copies/mL). A negative result must be combined with clinical observations, patient history, and epidemiological information. The expected result is Negative.  Fact Sheet for Patients:  BloggerCourse.com  Fact Sheet for Healthcare Providers:  SeriousBroker.it  This  test is no t yet approved or cleared by the [place] FDA and  has been authorized for detection and/or diagnosis of SARS-CoV-2 by FDA under an Emergency Use Authorization (EUA). This EUA will remain  in effect (meaning this test can be used) for the duration of the COVID-19 declaration under Section 564(b)(1) of the Act, 21 U.S.C.section 360bbb-3(b)(1), unless the authorization is terminated  or revoked sooner.       Influenza A by PCR NEGATIVE NEGATIVE Final   Influenza B by PCR NEGATIVE NEGATIVE Final    Comment: (NOTE) The Xpert Xpress SARS-CoV-2/FLU/RSV plus assay is intended as an aid in the diagnosis of influenza from Nasopharyngeal swab specimens and should not be used as a sole basis for treatment. Nasal washings and aspirates are unacceptable for Xpert Xpress SARS-CoV-2/FLU/RSV testing.  Fact Sheet for Patients: BloggerCourse.com  Fact Sheet for Healthcare Providers: SeriousBroker.it  This test is not yet approved or cleared by the [place] FDA and has been authorized for detection and/or diagnosis of SARS-CoV-2 by FDA under an Emergency Use Authorization (EUA). This EUA will remain in effect (meaning this test can be used) for the duration of the COVID-19 declaration under Section 564(b)(1) of the Act, 21 U.S.C. section 360bbb-3(b)(1), unless the authorization is terminated or revoked.  Performed at [facility], [address], [place], [place] [address]       Studies: No results found.  Scheduled Meds:  enoxaparin (LOVENOX) injection  40 mg Subcutaneous Q24H   escitalopram  15 mg Oral Daily   folic acid  1 mg Oral Daily   nadolol  20 mg Oral Daily   pantoprazole  40 mg Oral Daily   prazosin  1 mg Oral QHS   thiamine  100 mg Oral Daily   Or   thiamine  100 mg Intravenous Daily    Continuous Infusions:   LOS: 3 days     Myrtie Neither, MD [facility]  To reach me or the  doctor on call, go to: [email or web address] Password [email or web address]  09/27/2020, 11:01 AM

## 2020-09-27 NOTE — Plan of Care (Signed)

## 2020-09-28 ENCOUNTER — Encounter (HOSPITAL_COMMUNITY): Payer: Self-pay | Admitting: Gastroenterology

## 2020-09-28 LAB — BASIC METABOLIC PANEL
Anion gap: 11 (ref 5–15)
BUN: 8 mg/dL (ref 6–20)
CO2: 25 mmol/L (ref 22–32)
Calcium: 8.7 mg/dL — ABNORMAL LOW (ref 8.9–10.3)
Chloride: 101 mmol/L (ref 98–111)
Creatinine, Ser: 0.55 mg/dL — ABNORMAL LOW (ref 0.61–1.24)
GFR, Estimated: >60 mL/min (ref >60)
Glucose, Bld: 98 mg/dL (ref 70–99)
Potassium: 3.7 mmol/L (ref 3.5–5.1)
Sodium: 137 mmol/L (ref 135–145)

## 2020-09-28 MED ORDER — NADOLOL 20 MG PO TABS: 20.0000 mg | ORAL_TABLET | Freq: Every day | ORAL | 0 refills | Status: DC

## 2020-09-28 MED ORDER — ONDANSETRON HCL 4 MG PO TABS: 4.0000 mg | ORAL_TABLET | Freq: Four times a day (QID) | ORAL | 0 refills | Status: DC | PRN

## 2020-09-28 MED ORDER — POLYETHYLENE GLYCOL 3350 17 G PO PACK
17.0000 g | PACK | Freq: Every day | ORAL | 0 refills | Status: DC | PRN
Start: 2020-09-28 — End: 2020-10-23

## 2020-09-28 MED ORDER — THIAMINE HCL 100 MG PO TABS: 100.0000 mg | ORAL_TABLET | Freq: Every day | ORAL | 0 refills | Status: DC

## 2020-09-28 MED ORDER — FOLIC ACID 1 MG PO TABS: 1.0000 mg | ORAL_TABLET | Freq: Every day | ORAL | 0 refills | Status: DC

## 2020-09-28 NOTE — Progress Notes (Signed)
AVS given and reviewed with pt. Medications discussed. All questions answered to satisfaction. Pt verbalized understanding of information given. Pt escorted off the unit with all belongings via wheelchair by staff member.  

## 2020-09-28 NOTE — Plan of Care (Signed)

## 2020-09-28 NOTE — Discharge Summary (Signed)
Discharge Summary  Albert Hall RJJ:[id number] DOB: 1986-04-06  PCP: Carlean Jews, NP  Admit date: 09/23/2020 Discharge date: 09/28/2020  Time spent: 30 minutes  Recommendations for Outpatient Follow-up:  Primary care provider Gastroenterologist  Discharge Diagnoses:  Active Hospital Problems   Diagnosis Date Noted   SBP (spontaneous bacterial peritonitis) (HCC) 09/24/2020   Alcoholic cirrhosis of liver with ascites (HCC) 09/24/2020   Mesenteric mass 09/24/2020   Essential hypertension 08/30/2020   Alcohol abuse 10/10/2017   BMI >er 30 11/15/2015   Generalized anxiety disorder/ PTSD 01/08/2014    Resolved Hospital Problems  No resolved problems to display.    Discharge Condition: Improved  Diet recommendation: Heart healthy/high-protein  Vitals:   09/27/20 1944 09/28/20 0506  BP: 122/80 111/70  Pulse: 81 79  Resp: 16 16  Temp: 97.9 F (36.6 C) 98.3 F (36.8 C)  SpO2: 96% 94%    History of present illness:  35 year old male with significant history of alcohol abuse hypertension morbid obesity who presented to the hospital with worsening abdominal pain over the 2 weeks with nausea vomiting and diarrhea in the emergency room he was noted to have elevated LFT and leukocytosis.  CT abdomen showed small volume ascites and early signs of liver cirrhosis and findings suggestive of developing portal hypertension.  There was concern for spontaneous bacterial peritonitis and so he was started on meropenem.  Abdominal ultrasound was done and there was minimal ascites and GI did not think that he had spontaneous bacterial peritonitis so his antibiotics was discontinued but he did show that he was developing portal hypertension  Hospital Course:  Principal Problem:   SBP (spontaneous bacterial peritonitis) (HCC) Active Problems:   Generalized anxiety disorder/ PTSD   BMI >er 30   Alcohol abuse   Essential hypertension   Alcoholic cirrhosis of liver with ascites (HCC)    Mesenteric mass    1.  New onset alcoholic liver cirrhosis with alcoholic hepatitis with LFTs were elevated. His for 1 antitrypsin was initially elevated when he was admitted but today it is normalized at 8  2.  Hypokalemia that has been replaced  3.  Alcohol abuse. CIWA protocol is in place it is normal Patient advised to desist from alcohol use  4.  Morbid obesity with BMI of 35  5.  Hypertension.  Continue nadolol and Minipress  6.  Esophageal varices without high risk stigmata patient underwent EGD on September 26, 2020.  He will be continued on nadolol and PPI as well as thiamine and folate  Procedures: EGD 09/26/2020  Consultations: Gastroenterology  Discharge Exam: BP 111/70 (BP Location: Right Arm)   Pulse 79   Temp 98.3 F (36.8 C) (Oral)   Resp 16   Ht 6\' 1"  (1.854 m)   Wt 120.7 kg   SpO2 94%   BMI 35.09 kg/m   General: Morbidly obese Cardiovascular: Regular rate and rhythm Respiratory: Clear to auscultation no distress Abdomen distended but soft Extremity no edema  Discharge Instructions You were cared for by a hospitalist during your hospital stay. If you have any questions about your discharge medications or the care you received while you were in the hospital after you are discharged, you can call the unit and asked to speak with the hospitalist on call if the hospitalist that took care of you is not available. Once you are discharged, your primary care physician will handle any further medical issues. Please note that NO REFILLS for any discharge medications will be authorized once you  are discharged, as it is imperative that you return to your primary care physician (or establish a relationship with a primary care physician if you do not have one) for your aftercare needs so that they can reassess your need for medications and monitor your lab values.  Discharge Instructions     Call MD for:  persistant nausea and vomiting   Complete by: As directed     Call MD for:  temperature >100.4   Complete by: As directed    Diet - low sodium heart healthy   Complete by: As directed    -Promote intake of 100 to 125 g of protein daily   Discharge instructions   Complete by: As directed    -Promote intake of 100 to 125 g of protein daily CBC/CMP/INR should be obtained within 1 week of discharge GI will place an order for his office to have this done Follow-up in GI clinic in the next 3 to 5 weeks will be arranged Follow up with pcp in 1 week   Increase activity slowly   Complete by: As directed       Allergies as of 09/28/2020       Reactions   Penicillins Anaphylaxis   Shrimp (diagnostic) Hives   Gabapentin    Other reaction(s): Hallucinations   Hydrochlorothiazide    Iodine    Trazodone And Nefazodone Other (See Comments)   Affected hearing, malaise        Medication List     STOP taking these medications    aspirin-sod bicarb-citric acid 325 MG Tbef tablet Commonly known as: ALKA-SELTZER       TAKE these medications    ALPRAZolam 0.5 MG tablet Commonly known as: XANAX Take 1 tablet (0.5 mg total) by mouth daily as needed for anxiety.   amLODipine 5 MG tablet Commonly known as: NORVASC Take 1 tablet (5 mg total) by mouth daily.   escitalopram 10 MG tablet Commonly known as: LEXAPRO Take 1.5 tablets (15 mg total) by mouth daily.   folic acid 1 MG tablet Commonly known as: FOLVITE Take 1 tablet (1 mg total) by mouth daily. Start taking on: September 29, 2020   methocarbamol 500 MG tablet Commonly known as: Robaxin Take 1 tablet (500 mg total) by mouth every 6 (six) hours as needed for muscle spasms.   nadolol 20 MG tablet Commonly known as: CORGARD Take 1 tablet (20 mg total) by mouth daily. Start taking on: September 29, 2020   omeprazole 20 MG capsule Commonly known as: PRILOSEC Take 1 capsule po BID for GERD What changed:  how much to take how to take this when to take this additional instructions    ondansetron 4 MG tablet Commonly known as: ZOFRAN Take 1 tablet (4 mg total) by mouth every 6 (six) hours as needed for nausea.   polyethylene glycol 17 g packet Commonly known as: MIRALAX / GLYCOLAX Take 17 g by mouth daily as needed for mild constipation.   prazosin 1 MG capsule Commonly known as: MINIPRESS Take 1 capsule (1 mg total) by mouth at bedtime.   thiamine 100 MG tablet Take 1 tablet (100 mg total) by mouth daily. Start taking on: September 29, 2020   Vitamin D (Ergocalciferol) 1.25 MG (50000 UNIT) Caps capsule Commonly known as: DRISDOL Take 1 capsule (50,000 Units total) by mouth every 7 (seven) days.       Allergies  Allergen Reactions   Penicillins Anaphylaxis   Shrimp (Diagnostic) Hives   Gabapentin  Other reaction(s): Hallucinations   Hydrochlorothiazide    Iodine    Trazodone And Nefazodone Other (See Comments)    Affected hearing, malaise      The results of significant diagnostics from this hospitalization (including imaging, microbiology, ancillary and laboratory) are listed below for reference.    Significant Diagnostic Studies: DG Ribs Unilateral W/Chest Left  Result Date: 09/24/2020 CLINICAL DATA:  Pain after fall EXAM: LEFT RIBS AND CHEST - 3+ VIEW COMPARISON:  06/05/2016 FINDINGS: No fracture or other bone lesions are seen involving the ribs. There is no evidence of pneumothorax or pleural effusion. Both lungs are clear. Heart size and mediastinal contours are within normal limits. IMPRESSION: Negative. Electronically Signed   By: Charlett Nose M.D.   On: 09/24/2020 02:35   DG Abd 1 View  Result Date: 09/27/2020 CLINICAL DATA:  Abdominal distension. EXAM: ABDOMEN - 1 VIEW COMPARISON:  Body CT September 23, 2020 FINDINGS: The bowel gas pattern is normal. No radio-opaque calculi or other significant radiographic abnormality are seen. IMPRESSION: Negative. Electronically Signed   By: Ted Mcalpine M.D.   On: 09/27/2020 13:26   CT Abdomen Pelvis  W Contrast  Result Date: 09/23/2020 CLINICAL DATA:  35 year old male with history of upper abdominal pain for the past 2 months. Vomiting and diarrhea. EXAM: CT ABDOMEN AND PELVIS WITH CONTRAST TECHNIQUE: Multidetector CT imaging of the abdomen and pelvis was performed using the standard protocol following bolus administration of intravenous contrast. CONTRAST:  OMNIPAQUE IOHEXOL 300 MG/ML  SOLN COMPARISON:  None. FINDINGS: Lower chest: Trace left pleural effusion lying dependently. Hepatobiliary: Severe diffuse low attenuation throughout the hepatic parenchyma, indicative of severe hepatic steatosis. Liver also has a nodular contour, suggesting developing cirrhosis. No discrete cystic or solid hepatic lesions are confidently identified. No intra or extrahepatic biliary ductal dilatation. Gallbladder is normal in appearance. Pancreas: No pancreatic mass. No pancreatic ductal dilatation. No pancreatic or peripancreatic fluid collections or inflammatory changes. Spleen: Spleen is enlarged measuring 16.8 x 8.4 x 14.8 cm (estimated splenic volume of 1,044 mL) . Adrenals/Urinary Tract: Bilateral kidneys and adrenal glands are normal in appearance. No hydroureteronephrosis. Urinary bladder is normal in appearance. Stomach/Bowel: The appearance of the stomach is normal. There is no pathologic dilatation of small bowel or colon. Normal appendix. Vascular/Lymphatic: No atherosclerotic calcifications in the abdominal aorta or pelvic vasculature. In the central small bowel mesentery (axial image 61 of series 3 and coronal image 66 of series 6) there is a 2.7 x 2.2 x 2.6 cm low-intermediate attenuation lesion (21 HU) which may represent an enlarged lymph node. No other definite lymphadenopathy is noted elsewhere in the abdomen or pelvis. Reproductive: Prostate gland and seminal vesicles are unremarkable in appearance. Other: Small volume of ascites.  No pneumoperitoneum. Musculoskeletal: There are no aggressive  appearing lytic or blastic lesions noted in the visualized portions of the skeleton. IMPRESSION: 1. Severe hepatic steatosis with evidence of developing cirrhosis. 2. Splenomegaly, which could suggest developing portal venous hypertension. 3. Small volume of ascites. 4. Indeterminate lesion in the small bowel mesentery which has an unusual appearance. This may represent an enlarged lymph node or other mesenteric mass such as a desmoid tumor. Repeat contrast enhanced CT the abdomen and pelvis is recommended in 3 months to ensure the stability or regression of this lesion 5. Trace left pleural effusion lying dependently. Electronically Signed   By: Trudie Reed M.D.   On: 09/23/2020 20:27   US Abdomen Limited  Result Date: 09/27/2020 CLINICAL DATA:  Abdominal distension.  EXAM: LIMITED ABDOMEN ULTRASOUND FOR ASCITES TECHNIQUE: Limited ultrasound survey for ascites was performed in all four abdominal quadrants. COMPARISON:  Body CT September 23, 2020 FINDINGS: Examination of the all 4 quadrant of the abdomen demonstrates small amount of right perihepatic ascites. Nodular contour of the liver is noted. IMPRESSION: Right upper quadrant perihepatic ascites. Nodular contour of the liver usually seen with hepatic cirrhosis. Electronically Signed   By: Ted Mcalpine M.D.   On: 09/27/2020 13:27   IR ABDOMEN US LIMITED  Result Date: 09/24/2020 CLINICAL DATA:  35 year old gentleman presented for paracentesis EXAM: LIMITED ABDOMEN ULTRASOUND FOR ASCITES TECHNIQUE: Limited ultrasound survey for ascites was performed in all four abdominal quadrants. COMPARISON:  None. FINDINGS: No significant ascites. IMPRESSION: No significant ascites.  No paracentesis performed. Electronically Signed   By: Acquanetta Belling M.D.   On: 09/24/2020 13:39    Microbiology: Recent Results (from the past 240 hour(s))  Resp Panel by RT-PCR (Flu A&B, Covid) Nasopharyngeal Swab     Status: None   Collection Time: 09/24/20  3:59 AM   Specimen:  Nasopharyngeal Swab; Nasopharyngeal(NP) swabs in vial transport medium  Result Value Ref Range Status   SARS Coronavirus 2 by RT PCR NEGATIVE NEGATIVE Final    Comment: (NOTE) SARS-CoV-2 target nucleic acids are NOT DETECTED.  The SARS-CoV-2 RNA is generally detectable in upper respiratory specimens during the acute phase of infection. The lowest concentration of SARS-CoV-2 viral copies this assay can detect is 138 copies/mL. A negative result does not preclude SARS-Cov-2 infection and should not be used as the sole basis for treatment or other patient management decisions. A negative result may occur with  improper specimen collection/handling, submission of specimen other than nasopharyngeal swab, presence of viral mutation(s) within the areas targeted by this assay, and inadequate number of viral copies(<138 copies/mL). A negative result must be combined with clinical observations, patient history, and epidemiological information. The expected result is Negative.  Fact Sheet for Patients:  BloggerCourse.com  Fact Sheet for Healthcare Providers:  SeriousBroker.it  This test is no t yet approved or cleared by the [place] FDA and  has been authorized for detection and/or diagnosis of SARS-CoV-2 by FDA under an Emergency Use Authorization (EUA). This EUA will remain  in effect (meaning this test can be used) for the duration of the COVID-19 declaration under Section 564(b)(1) of the Act, 21 U.S.C.section 360bbb-3(b)(1), unless the authorization is terminated  or revoked sooner.       Influenza A by PCR NEGATIVE NEGATIVE Final   Influenza B by PCR NEGATIVE NEGATIVE Final    Comment: (NOTE) The Xpert Xpress SARS-CoV-2/FLU/RSV plus assay is intended as an aid in the diagnosis of influenza from Nasopharyngeal swab specimens and should not be used as a sole basis for treatment. Nasal washings and aspirates are unacceptable for  Xpert Xpress SARS-CoV-2/FLU/RSV testing.  Fact Sheet for Patients: BloggerCourse.com  Fact Sheet for Healthcare Providers: SeriousBroker.it  This test is not yet approved or cleared by the [place] FDA and has been authorized for detection and/or diagnosis of SARS-CoV-2 by FDA under an Emergency Use Authorization (EUA). This EUA will remain in effect (meaning this test can be used) for the duration of the COVID-19 declaration under Section 564(b)(1) of the Act, 21 U.S.C. section 360bbb-3(b)(1), unless the authorization is terminated or revoked.  Performed at [facility], [address], [place], [place] [address]      Labs: Basic Metabolic Panel: Recent Labs  Lab 09/24/20 [phone] 09/25/20 [id number]  09/26/20 0100 09/27/20 0158 09/28/20 0219  NA 133* 136 134* 136 137  K 3.0* 3.2* 3.1* 3.3* 3.7  CL 92* 100 98 102 101  CO2 26 28 27 27 25   GLUCOSE 89 83 71 76 98  BUN 8 7 8 6 8   CREATININE 0.52* 0.55* 0.54* 0.56* 0.55*  CALCIUM 8.4* 8.3* 8.3* 8.5* 8.7*  MG 1.8  --  2.0  --   --   PHOS 4.8*  --  2.9  --   --    Liver Function Tests: Recent Labs  Lab 09/23/20 1146 09/24/20 0633 09/25/20 0535 09/26/20 0100 09/27/20 0158  AST 126* 99* 103* 109* 125*  ALT 68* 58* 49* 51* 54*  ALKPHOS 212* 169* 181* 179* 179*  BILITOT 5.7* 6.7* 5.8* 5.0* 4.0*  PROT 7.3 7.1 6.1* 5.9* 6.2*  ALBUMIN 2.7* 2.5* 2.2* 2.2* 2.2*   Recent Labs  Lab 09/23/20 1146  LIPASE 23   Recent Labs  Lab 09/25/20 0535  AMMONIA 59*   CBC: Recent Labs  Lab 09/23/20 1146 09/24/20 0633 09/25/20 0535 09/26/20 0100 09/27/20 0158  WBC 14.1* 15.2* 10.1 8.4 8.3  NEUTROABS 11.4*  --   --   --  5.9  HGB 12.3* 11.1* 10.5* 10.4* 10.3*  HCT 36.6* 32.8* 31.2* 30.0* 30.9*  MCV 98.7 100.0 101.6* 100.7* 102.7*  PLT 229 203 164 163 161   Cardiac Enzymes: No results for input(s): CKTOTAL, CKMB, CKMBINDEX, TROPONINI in the last 168 hours. BNP: BNP  (last 3 results) No results for input(s): BNP in the last 8760 hours.  ProBNP (last 3 results) No results for input(s): PROBNP in the last 8760 hours.  CBG: No results for input(s): GLUCAP in the last 168 hours.     Signed:  Myrtie NeitherNwannadiya Renuka Farfan, MD [facility] 09/28/2020, 2:02 PM

## 2020-09-29 ENCOUNTER — Telehealth: Payer: Self-pay

## 2020-09-29 ENCOUNTER — Other Ambulatory Visit: Payer: BC Managed Care – PPO

## 2020-09-29 ENCOUNTER — Other Ambulatory Visit: Payer: Self-pay

## 2020-09-29 ENCOUNTER — Telehealth: Payer: Self-pay | Admitting: Nurse Practitioner

## 2020-09-29 DIAGNOSIS — K7031 Alcoholic cirrhosis of liver with ascites: Secondary | ICD-10-CM

## 2020-09-29 LAB — SURGICAL PATHOLOGY

## 2020-09-29 NOTE — Telephone Encounter (Signed)
Patient was discharged from ER yesterday and was not told when to return to work. On a scale of one to ten his pain level is about a 5 or 6 for his stomach bruising. Patient is scheduled with Herbert Seta (PCP) this week. Patient states his stomach is bloated and his appetite isn't what it was. He is not sure when to return to work as he has not been advised. Please advise, thanks.

## 2020-09-29 NOTE — Telephone Encounter (Signed)
Tomorrow, we may need to ask him if he is following up with GI or surgery. That may be who needs to give him the ok to go back to work.

## 2020-09-29 NOTE — Telephone Encounter (Signed)
-----   Message from Lemar Lofty., MD sent at 09/27/2020  1:00 PM EDT ----- Regarding: Follow-up Albert Hall or covering RN, This patient will be discharged likely over the weekend. This patient needs a follow-up in clinic with myself or one of the APP's in 3 to 4 weeks. He needs a set of labs to be drawn on Thursday or Friday that include-CBC/CMP/INR. Thanks. GM

## 2020-09-29 NOTE — Telephone Encounter (Signed)
Left message for pt to call back. Lab orders in epic. Pt scheduled to see Alcide Evener NP 10/23/20@1 :30pm.

## 2020-09-30 ENCOUNTER — Encounter: Payer: Self-pay | Admitting: Gastroenterology

## 2020-09-30 ENCOUNTER — Telehealth: Payer: Self-pay | Admitting: Nurse Practitioner

## 2020-09-30 LAB — AFP TUMOR MARKER: AFP, Serum, Tumor Marker: 4.1 ng/mL (ref 0.0–6.9)

## 2020-09-30 NOTE — Telephone Encounter (Signed)
I think in the setting of his working diagnosis of alcoholic hepatitis, his hospitalization, it is reasonable for the patient to have a work note until his PCP visit this week. After his follow-up with his PCP, any additional work note will be necessary through their office. Thanks. GM

## 2020-09-30 NOTE — Telephone Encounter (Signed)
Dr. Meridee Score please advise regarding work note, see below.

## 2020-09-30 NOTE — Telephone Encounter (Signed)
Left detailed message for pt regarding the need for labs this Thursday or Friday and about his upcoming appt. Left message for pt to call back and confirm getting the message.

## 2020-09-30 NOTE — Telephone Encounter (Signed)
Letter written and up front for pts wife to pick up.

## 2020-09-30 NOTE — Telephone Encounter (Signed)
Patient is returning your call,thanks. °

## 2020-09-30 NOTE — Telephone Encounter (Signed)
Spoke to pt wife pt will f/u on Thursday

## 2020-09-30 NOTE — Telephone Encounter (Signed)
Spoke with pts wife and let her know we can  write him out of work until he sees his PCP on Thursday. Letter left up front for pt to pick up.

## 2020-09-30 NOTE — Telephone Encounter (Signed)
Called pt LVM to call the office  

## 2020-09-30 NOTE — Telephone Encounter (Signed)
Inbound call from patient wife. Confirmed getting the message about labs and upcoming appointment.  Also had questions if he could get out of work until he see his PCP Thursday 10/02/20. Best contact number [phone].

## 2020-10-02 ENCOUNTER — Other Ambulatory Visit: Payer: Self-pay

## 2020-10-02 ENCOUNTER — Encounter: Payer: Self-pay | Admitting: Nurse Practitioner

## 2020-10-02 ENCOUNTER — Ambulatory Visit (INDEPENDENT_AMBULATORY_CARE_PROVIDER_SITE_OTHER): Payer: BC Managed Care – PPO | Admitting: Nurse Practitioner

## 2020-10-02 VITALS — BP 100/66 | HR 82 | Temp 97.2°F | Ht 73.0 in | Wt 256.4 lb

## 2020-10-02 DIAGNOSIS — D689 Coagulation defect, unspecified: Secondary | ICD-10-CM

## 2020-10-02 DIAGNOSIS — Z09 Encounter for follow-up examination after completed treatment for conditions other than malignant neoplasm: Secondary | ICD-10-CM

## 2020-10-02 DIAGNOSIS — D72829 Elevated white blood cell count, unspecified: Secondary | ICD-10-CM

## 2020-10-02 DIAGNOSIS — E876 Hypokalemia: Secondary | ICD-10-CM | POA: Diagnosis not present

## 2020-10-02 DIAGNOSIS — K7031 Alcoholic cirrhosis of liver with ascites: Secondary | ICD-10-CM | POA: Diagnosis not present

## 2020-10-02 DIAGNOSIS — F5104 Psychophysiologic insomnia: Secondary | ICD-10-CM

## 2020-10-02 MED ORDER — PRAZOSIN HCL 2 MG PO CAPS: 2.0000 mg | ORAL_CAPSULE | Freq: Every day | ORAL | 2 refills | Status: DC

## 2020-10-02 NOTE — Progress Notes (Signed)
Established Patient Office Visit  Subjective:  Patient ID: Albert Hall, male    DOB: Jun 13, 1985  Age: 35 y.o. MRN: [id number]  CC:  Chief Complaint  Patient presents with   Hospitalization Follow-up    HPI EUCLIDE GRANITO presents for follow up after recent hospitalization. Went to Emergency Department due to intractable vomiting and abdominal pain. This has started earlier in the day when he had returned to work after being out due to surgery on his back. He states that it was 98 degrees and he was working outside in the heat. He started to note swelling in his abdomen. This was causing him to feel nauseated. Was unable to get even one cup of water down. Initial diagnosis in the ED was bacterial peritonitis. He was started on IV meropenem. He had abdominal ultrasound which was negative for peritonitis and antibiotics were discontinued. He had very small amount of ascites in the abdomen. They were unable to drain the small amount of fluid. He did have upper endoscopy. They found Esophageal varices without high risk stigmata. He does have mild cirrhosis of the liver with alcoholic cirrhosis. On admission he did have leukocytosis and elevated liver functions. CIWA protocol was initiated during his hospitalization and he was able to withdraw safely from alcohol use. His assessment was normal upon discharge from the hospital.  Today, he states that he is starting to feel some better. Swelling in abdomen is improving. He still has some tenderness. He is fatigued and feels a little weak. He was supposed to return to work this past Monday. This was day after discharge from hospital. He states that fatigue and abdominal tenderness were severe enough to keep him home. He is concerned that additional time off will cause him to lose his job. He had just returned to work after [employer] from spinal surgery when problems began with his abdomen. He is unable to lift as required by his job which is between 50 and 75  pounds.  He states that his anxiety and depression are improved. He has stopped drinking alcohol completely. Ha not had a drink since discharge from the hospital. States that he continues to have some trouble sleeping. He state states that is one time when anxiety continues to plague him.  He denies chest pain, chest pressure, or shortness of breath. He denies headaches or visual disturbances.   Past Medical History:  Diagnosis Date   Anxiety    Generalized anxiety disorder 01/08/2014   GERD (gastroesophageal reflux disease)    Hypertension    Insomnia 01/08/2014   Male sexual dysfunction    Tobacco abuse 02/11/2014    Past Surgical History:  Procedure Laterality Date   BIOPSY  09/26/2020   Procedure: BIOPSY;  Surgeon: Rush Landmark Telford Nab., MD;  Location: [place];  Service: Gastroenterology;;   ESOPHAGOGASTRODUODENOSCOPY N/A 09/26/2020   Procedure: ESOPHAGOGASTRODUODENOSCOPY (EGD);  Surgeon: Irving Copas., MD;  Location: [place];  Service: Gastroenterology;  Laterality: N/A;   LUMBAR LAMINECTOMY/ DECOMPRESSION WITH MET-RX Right 07/24/2020   Procedure: Right Lumbar Four-Five Minimally Invasive Microdiscectomy with Metrx;  Surgeon: Karsten Ro, DO;  Location: [place];  Service: Neurosurgery;  Laterality: Right;    Family History  Problem Relation Age of Onset   Alcoholism Mother    High blood pressure Mother    Asthma Father    Alcoholism Father    Depression Father    Asthma Sister    Diabetes Paternal Uncle    Cancer Neg Hx  Stroke Neg Hx    CAD Neg Hx     Social History   Socioeconomic History   Marital status: Married    Spouse name: [name]   Number of children: 0   Years of education: 14   Highest education level: Not on file  Occupational History   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   Activity: no regular exercise   Diet: good water, fruits/vegetables daily   Social Determinants of Health   Financial Resource Strain: Not on file  Food Insecurity: Not on file  Transportation Needs: Not on file  Physical Activity: Not on file  Stress: Not on file  Social Connections: Not on file  Intimate Partner Violence: Not on file    Outpatient Medications Prior to Visit  Medication Sig Dispense Refill   ALPRAZolam (XANAX) 0.5 MG tablet Take 1 tablet (0.5 mg total) by mouth daily as needed for anxiety. 30 tablet 0   amLODipine (NORVASC) 5 MG tablet Take 1 tablet (5 mg total) by mouth daily. 90 tablet 1   escitalopram (LEXAPRO) 10 MG tablet Take 1.5 tablets (15 mg total) by mouth daily. 696 tablet 0   folic acid (FOLVITE) 1 MG tablet Take 1 tablet (1 mg total) by mouth daily. 30 tablet 0   methocarbamol (ROBAXIN) 500 MG tablet Take 1 tablet (500 mg total) by mouth every 6 (six) hours as needed for muscle spasms. 90 tablet 1   nadolol (CORGARD) 20 MG tablet Take 1 tablet (20 mg total) by mouth daily. 30 tablet 0   omeprazole (PRILOSEC) 20 MG capsule Take 1 capsule po BID for GERD 60 capsule 1   ondansetron (ZOFRAN) 4 MG tablet Take 1 tablet (4 mg total) by mouth every 6 (six) hours as needed for nausea. 20 tablet 0   polyethylene glycol (MIRALAX / GLYCOLAX) 17 g packet Take 17 g by mouth daily as needed for mild constipation. 14 each 0   thiamine 100 MG tablet Take 1 tablet (100 mg total) by mouth daily. 30 tablet 0   Vitamin D, Ergocalciferol, (DRISDOL) 50000 units CAPS capsule Take 1  capsule (50,000 Units total) by mouth every 7 (seven) days. 12 capsule 3   prazosin (MINIPRESS) 1 MG capsule Take 1 capsule (1 mg total) by mouth at bedtime. 30 capsule 1   No facility-administered medications prior to visit.    Allergies  Allergen Reactions   Penicillins Anaphylaxis   Shrimp (Diagnostic) Hives   Gabapentin     Other reaction(s): Hallucinations   Hydrochlorothiazide    Iodine    Trazodone And Nefazodone Other (See Comments)    Affected hearing, malaise    ROS Review of Systems  Constitutional:  Positive for activity change and fatigue. Negative for appetite change, chills and fever.  HENT:  Negative  for congestion, postnasal drip, rhinorrhea, sinus pressure and sinus pain.   Eyes: Negative.   Respiratory:  Negative for cough, chest tightness, shortness of breath and wheezing.   Cardiovascular:  Negative for chest pain and palpitations.  Gastrointestinal:  Positive for abdominal distention, abdominal pain, nausea and vomiting. Negative for constipation.       Has improved since his hospitalization.   Endocrine: Negative for cold intolerance, heat intolerance, polydipsia and polyuria.  Genitourinary: Negative.   Musculoskeletal:  Negative for back pain and myalgias.  Skin:  Negative for rash.       Bruising of abdomen from injections received In hospital.  Allergic/Immunologic: Negative.   Neurological:  Negative for dizziness, weakness and headaches.  Psychiatric/Behavioral:  Positive for dysphoric mood and sleep disturbance. The patient is nervous/anxious.        Symptoms some improved since his last visit      Objective:    Physical Exam Vitals and nursing note reviewed.  Constitutional:      Appearance: Normal appearance. He is well-developed. He is obese.  HENT:     Head: Normocephalic and atraumatic.     Nose: Nose normal.     Mouth/Throat:     Mouth: Mucous membranes are moist.  Eyes:     Extraocular Movements: Extraocular movements intact.      Conjunctiva/sclera: Conjunctivae normal.     Pupils: Pupils are equal, round, and reactive to light.  Cardiovascular:     Rate and Rhythm: Normal rate and regular rhythm.     Pulses: Normal pulses.     Heart sounds: Normal heart sounds.  Pulmonary:     Effort: Pulmonary effort is normal.     Breath sounds: Normal breath sounds.  Abdominal:     General: Bowel sounds are normal. There is distension.     Palpations: Abdomen is soft.     Tenderness: There is abdominal tenderness.     Comments: Improved distension. Mild, generalized abdominal tenderness with palpation.   Musculoskeletal:        General: Normal range of motion.     Cervical back: Normal range of motion and neck supple.  Lymphadenopathy:     Cervical: No cervical adenopathy.  Skin:    General: Skin is warm and dry.     Capillary Refill: Capillary refill takes less than 2 seconds.  Neurological:     General: No focal deficit present.     Mental Status: He is alert and oriented to person, place, and time.  Psychiatric:        Attention and Perception: Attention and perception normal.        Mood and Affect: Mood is anxious and depressed.        Speech: Speech normal.        Behavior: Behavior normal. Behavior is cooperative.        Thought Content: Thought content normal.        Cognition and Memory: Cognition and memory normal.        Judgment: Judgment normal.     Comments: Improved mood and anxiety since his most recent visit with me.     Today's Vitals   10/02/20 1343  BP: 100/66  Pulse: 82  Temp: (!) 97.2 F (36.2 C)  SpO2: 99%  Weight: 256 lb 6.4 oz (116.3 kg)  Height: 6' 1" (1.854 m)   Body mass index is 33.83 kg/m.  Wt Readings from Last 3 Encounters:  10/02/20 256 lb 6.4 oz (116.3 kg)  09/26/20 266  lb (120.7 kg)  09/11/20 266 lb 6.4 oz (120.8 kg)     Health Maintenance Due  Topic Date Due   COVID-19 Vaccine (1) Never done   Pneumococcal Vaccine 72-81 Years old (1 - PCV) Never done    TETANUS/TDAP  04/12/2018    There are no preventive care reminders to display for this patient.  Lab Results  Component Value Date   TSH 2.540 09/04/2020   Lab Results  Component Value Date   WBC 9.7 10/03/2020   HGB 11.7 (L) 10/03/2020   HCT 34.2 (L) 10/03/2020   MCV 96 10/03/2020   PLT 227 10/03/2020   Lab Results  Component Value Date   NA 141 10/02/2020   K 3.8 10/02/2020   CO2 20 10/02/2020   GLUCOSE 86 10/02/2020   BUN 10 10/02/2020   CREATININE 0.48 (L) 10/02/2020   BILITOT 2.8 (H) 10/02/2020   ALKPHOS 256 (H) 10/02/2020   AST 155 (H) 10/02/2020   ALT 73 (H) 10/02/2020   PROT 7.1 10/02/2020   ALBUMIN 3.9 (L) 10/02/2020   CALCIUM 8.8 10/02/2020   ANIONGAP 11 09/28/2020   EGFR 139 10/02/2020   Lab Results  Component Value Date   CHOL 223 (H) 09/04/2020   Lab Results  Component Value Date   HDL 27 (L) 09/04/2020   Lab Results  Component Value Date   LDLCALC 169 (H) 09/04/2020   Lab Results  Component Value Date   TRIG 146 09/04/2020   Lab Results  Component Value Date   CHOLHDL 8.3 (H) 09/04/2020   Lab Results  Component Value Date   HGBA1C 5.4 09/04/2020      Assessment & Plan:  1. Hospital discharge follow-up Patient recently hospitalized from 09/23/2020 through 09/28/2020. He was diagnosed with alcoholic cirrhosis of the liver and alcoholic hepatitis. We reviewed hospital records, tests, images, and labs. He is feeling some better, but continues to have mild abdominal tenderness and feels weak and tired. Suggest he suspend return to work through the end of next week and return on 10/14/2020. He and his wife are both in agreement with this plan. Will fill out required forms and return to patient when they are available.   2. Alcoholic cirrhosis of liver with ascites ([place]) Patient diagnosed with alcoholic cirrhosis of the liver. His LFTs were very elevated in the hospital, as they were prior to his hospitalization. Will recheck these, along with INR  and CBC. The patient has been referred to GI provider.  - INR/PT - Comp Met (CMET) - CBC w/Diff  3. Leukocytosis, unspecified type Recheck CBC today  4. Hypokalemia Check CMP today - Comp Met (CMET)  5. Coagulopathy (HCC) Check INR  6. Psychophysiological insomnia Improving. Increase minipress to 47m every evening to help with insomnia. New prescription sent to the pharmacy.  - prazosin (MINIPRESS) 2 MG capsule; Take 1 capsule (2 mg total) by mouth at bedtime.  Dispense: 30 capsule; Refill: 2   Problem List Items Addressed This Visit       Digestive   Alcoholic cirrhosis of liver with ascites ([facility]     Hematopoietic and Hemostatic   Coagulopathy ([facility]     Other   H/o Insomnia (Chronic)   Relevant Medications   prazosin (MINIPRESS) 2 MG capsule   Hospital discharge follow-up - Primary   Leukocytosis   Hypokalemia   Relevant Orders   Comp Met (CMET) (Completed)    Meds ordered this encounter  Medications   prazosin (MINIPRESS) 2 MG capsule  Sig: Take 1 capsule (2 mg total) by mouth at bedtime.    Dispense:  30 capsule    Refill:  2    Please note increased dose    Order Specific Question:   Supervising Provider    Answer:   Beatrice Lecher D [[id number]]    Follow-up: Return for follow up next thursday r friday - determine readiness to return to work. Marland Kitchen    Ronnell Freshwater, NP

## 2020-10-03 ENCOUNTER — Other Ambulatory Visit: Payer: BC Managed Care – PPO

## 2020-10-03 ENCOUNTER — Other Ambulatory Visit: Payer: Self-pay

## 2020-10-03 DIAGNOSIS — D72829 Elevated white blood cell count, unspecified: Secondary | ICD-10-CM | POA: Diagnosis not present

## 2020-10-03 DIAGNOSIS — D689 Coagulation defect, unspecified: Secondary | ICD-10-CM | POA: Diagnosis not present

## 2020-10-03 LAB — COMPREHENSIVE METABOLIC PANEL
ALT: 73 IU/L — ABNORMAL HIGH (ref 0–44)
AST: 155 IU/L — ABNORMAL HIGH (ref 0–40)
Albumin/Globulin Ratio: 1.2 (ref 1.2–2.2)
Albumin: 3.9 g/dL — ABNORMAL LOW (ref 4.0–5.0)
Alkaline Phosphatase: 256 IU/L — ABNORMAL HIGH (ref 44–121)
BUN/Creatinine Ratio: 21 — ABNORMAL HIGH (ref 9–20)
BUN: 10 mg/dL (ref 6–20)
Bilirubin Total: 2.8 mg/dL — ABNORMAL HIGH (ref 0.0–1.2)
CO2: 20 mmol/L (ref 20–29)
Calcium: 8.8 mg/dL (ref 8.7–10.2)
Chloride: 102 mmol/L (ref 96–106)
Creatinine, Ser: 0.48 mg/dL — ABNORMAL LOW (ref 0.76–1.27)
Globulin, Total: 3.2 g/dL (ref 1.5–4.5)
Glucose: 86 mg/dL (ref 65–99)
Potassium: 3.8 mmol/L (ref 3.5–5.2)
Sodium: 141 mmol/L (ref 134–144)
Total Protein: 7.1 g/dL (ref 6.0–8.5)
eGFR: 139 mL/min/{1.73_m2} (ref >59)

## 2020-10-03 NOTE — Addendum Note (Signed)
Addended by: Lupita Leash on: 10/03/2020 11:17 AM   Modules accepted: Orders

## 2020-10-03 NOTE — Progress Notes (Signed)
Liver functions going back up. Waiting on other lab resuts. He does see GI in July.

## 2020-10-04 LAB — CBC WITH DIFFERENTIAL/PLATELET
Basophils Absolute: 0.1 10*3/uL (ref 0.0–0.2)
Basos: 1 %
EOS (ABSOLUTE): 0.1 10*3/uL (ref 0.0–0.4)
Eos: 1 %
Hematocrit: 34.2 % — ABNORMAL LOW (ref 37.5–51.0)
Hemoglobin: 11.7 g/dL — ABNORMAL LOW (ref 13.0–17.7)
Immature Grans (Abs): 0.1 10*3/uL (ref 0.0–0.1)
Immature Granulocytes: 1 %
Lymphocytes Absolute: 1.5 10*3/uL (ref 0.7–3.1)
Lymphs: 15 %
MCH: 33 pg (ref 26.6–33.0)
MCHC: 34.2 g/dL (ref 31.5–35.7)
MCV: 96 fL (ref 79–97)
Monocytes Absolute: 0.7 10*3/uL (ref 0.1–0.9)
Monocytes: 7 %
Neutrophils Absolute: 7.3 10*3/uL — ABNORMAL HIGH (ref 1.4–7.0)
Neutrophils: 75 %
Platelets: 227 10*3/uL (ref 150–450)
RBC: 3.55 x10E6/uL — ABNORMAL LOW (ref 4.14–5.80)
RDW: 14.2 % (ref 11.6–15.4)
WBC: 9.7 10*3/uL (ref 3.4–10.8)

## 2020-10-04 LAB — PROTIME-INR
INR: 1.2 (ref 0.9–1.2)
Prothrombin Time: 12.1 s — ABNORMAL HIGH (ref 9.1–12.0)

## 2020-10-06 ENCOUNTER — Other Ambulatory Visit: Payer: Self-pay | Admitting: Nurse Practitioner

## 2020-10-06 DIAGNOSIS — F5104 Psychophysiologic insomnia: Secondary | ICD-10-CM

## 2020-10-06 NOTE — Progress Notes (Signed)
Labs are improving other than liver enzymes. Will discuss with patient at his next visit.

## 2020-10-06 NOTE — Telephone Encounter (Signed)
I increased this to 2mg  capsules when he was here last week. So this can be discontinued.

## 2020-10-09 ENCOUNTER — Encounter: Payer: Self-pay | Admitting: Nurse Practitioner

## 2020-10-09 ENCOUNTER — Other Ambulatory Visit: Payer: Self-pay

## 2020-10-09 ENCOUNTER — Ambulatory Visit (INDEPENDENT_AMBULATORY_CARE_PROVIDER_SITE_OTHER): Payer: BC Managed Care – PPO | Admitting: Nurse Practitioner

## 2020-10-09 VITALS — BP 103/67 | HR 99 | Temp 97.3°F | Ht 73.0 in | Wt 249.6 lb

## 2020-10-09 DIAGNOSIS — D72829 Elevated white blood cell count, unspecified: Secondary | ICD-10-CM

## 2020-10-09 DIAGNOSIS — D689 Coagulation defect, unspecified: Secondary | ICD-10-CM | POA: Diagnosis not present

## 2020-10-09 DIAGNOSIS — Z09 Encounter for follow-up examination after completed treatment for conditions other than malignant neoplasm: Secondary | ICD-10-CM | POA: Insufficient documentation

## 2020-10-09 DIAGNOSIS — E876 Hypokalemia: Secondary | ICD-10-CM

## 2020-10-09 DIAGNOSIS — F4312 Post-traumatic stress disorder, chronic: Secondary | ICD-10-CM

## 2020-10-09 DIAGNOSIS — R7989 Other specified abnormal findings of blood chemistry: Secondary | ICD-10-CM

## 2020-10-09 DIAGNOSIS — K7031 Alcoholic cirrhosis of liver with ascites: Secondary | ICD-10-CM | POA: Diagnosis not present

## 2020-10-09 NOTE — Progress Notes (Signed)
Established Patient Office Visit  Subjective:  Patient ID: Albert Hall, male    DOB: Mar 18, 1986  Age: 35 y.o. MRN: [id number]  CC:  Chief Complaint  Patient presents with   Follow-up    HPI Albert Hall presents for follow up visit. He was last seen after hospitalization due to development of alcoholic cirrhosis of the liver and alcoholic hepatitis. He has been out of work following that hospitalization. We had discussed him returning to work on July 5 and he states that he feels ready to do so. He states that he is feeling better. Abdominal pain is minimal. Swelling in the abdomen continues to improve. He has not had any alcoholic drinks in 14 days. He is now drinking plenty of water, Gatorade, and ginger ale. He states that stress levels are improved. His sleep is better.  Labs were checked at last visit. He has improved CBC, though he is still mildly anemic. His PT/INR is returning to normal. His LFTs were higher with this check than they had been previously. He is following up with gastroenterologist. The patient is consuming Ferralite high protein drink daily to help improve the cirrhosis of the liver.   Past Medical History:  Diagnosis Date   Anxiety    Generalized anxiety disorder 01/08/2014   GERD (gastroesophageal reflux disease)    Hypertension    Insomnia 01/08/2014   Male sexual dysfunction    Tobacco abuse 02/11/2014    Past Surgical History:  Procedure Laterality Date   BIOPSY  09/26/2020   Procedure: BIOPSY;  Surgeon: Rush Landmark Telford Nab., MD;  Location: [facility];  Service: Gastroenterology;;   ESOPHAGOGASTRODUODENOSCOPY N/A 09/26/2020   Procedure: ESOPHAGOGASTRODUODENOSCOPY (EGD);  Surgeon: Irving Copas., MD;  Location: [facility];  Service: Gastroenterology;  Laterality: N/A;   LUMBAR LAMINECTOMY/ DECOMPRESSION WITH MET-RX Right 07/24/2020   Procedure: Right Lumbar Four-Five Minimally Invasive Microdiscectomy with Metrx;  Surgeon: Karsten Ro, DO;  Location: [place] [facility];  Service: Neurosurgery;  Laterality: Right;    Family History  Problem Relation Age of Onset   Alcoholism Mother    High blood pressure Mother    Asthma Father    Alcoholism Father    Depression Father    Asthma Sister    Diabetes Paternal Uncle    Cancer Neg Hx    Stroke Neg Hx    CAD Neg Hx     Social History   Socioeconomic History   Marital status: Married    Spouse name: Water quality scientist   Number of children: 0   Years of education: 14   Highest education level: Not on file  Occupational History   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   Activity: no regular exercise   Diet: good water, fruits/vegetables daily   Social Determinants of  Health   Financial Resource Strain: Not on file  Food Insecurity: Not on file  Transportation Needs: Not on file  Physical Activity: Not on file  Stress: Not on file  Social Connections: Not on file  Intimate Partner Violence: Not on file    Outpatient Medications Prior to Visit  Medication Sig Dispense Refill   ALPRAZolam (XANAX) 0.5 MG tablet Take 1 tablet (0.5 mg total) by mouth daily as needed for anxiety. 30 tablet 0   amLODipine (NORVASC) 5 MG tablet Take 1 tablet (5 mg total) by mouth daily. 90 tablet 1   escitalopram (LEXAPRO) 10 MG tablet Take 1.5 tablets (15 mg total) by mouth daily. 814 tablet 0   folic acid (FOLVITE) 1 MG tablet Take 1 tablet (1 mg total)  by mouth daily. 30 tablet 0   methocarbamol (ROBAXIN) 500 MG tablet Take 1 tablet (500 mg total) by mouth every 6 (six) hours as needed for muscle spasms. 90 tablet 1   nadolol (CORGARD) 20 MG tablet Take 1 tablet (20 mg total) by mouth daily. 30 tablet 0   omeprazole (PRILOSEC) 20 MG capsule Take 1 capsule po BID for GERD 60 capsule 1   ondansetron (ZOFRAN) 4 MG tablet Take 1 tablet (4 mg total) by mouth every 6 (six) hours as needed for nausea. 20 tablet 0   polyethylene glycol (MIRALAX / GLYCOLAX) 17 g packet Take 17 g by mouth daily as needed for mild constipation. 14 each 0   prazosin (MINIPRESS) 2 MG capsule Take 1 capsule (2 mg total) by mouth at bedtime. 30 capsule 2   thiamine 100 MG tablet Take 1 tablet (100 mg total) by mouth daily. 30 tablet 0   Vitamin D, Ergocalciferol, (DRISDOL) 50000 units CAPS capsule Take 1 capsule (50,000 Units total) by mouth every 7 (seven) days. 12 capsule 3   No facility-administered medications prior to visit.    Allergies  Allergen Reactions   Penicillins Anaphylaxis   Shrimp (Diagnostic) Hives   Gabapentin     Other reaction(s): Hallucinations   Hydrochlorothiazide    Iodine    Trazodone And Nefazodone Other (See Comments)    Affected hearing, malaise    ROS Review of Systems  Constitutional:  Positive for activity change and fatigue. Negative for appetite change, chills and fever.  HENT:  Negative for congestion, postnasal drip, rhinorrhea, sinus pressure and sinus pain.   Eyes: Negative.   Respiratory:  Negative for cough, chest tightness, shortness of breath and wheezing.   Cardiovascular:  Negative for chest pain and palpitations.  Gastrointestinal:  Positive for abdominal distention, abdominal pain, nausea and vomiting. Negative for constipation.       Improved bruising of the abdomen and improved abdominal pain since most recently seen.  Endocrine: Negative for cold intolerance, heat intolerance, polydipsia and polyuria.   Genitourinary: Negative.   Musculoskeletal:  Negative for back pain and myalgias.  Skin:  Negative for rash.  Allergic/Immunologic: Negative.   Neurological:  Negative for dizziness, weakness and headaches.  Psychiatric/Behavioral:  Positive for dysphoric mood and sleep disturbance. The patient is nervous/anxious.        Improved mood and improved sleep since most recent visit.  Continues to be alcohol free since his hospitalization.     Objective:    Physical Exam Vitals and nursing note reviewed.  Constitutional:      Appearance: Normal appearance. He is well-developed. He is obese.  HENT:     Head: Normocephalic and atraumatic.     Nose:  Nose normal.     Mouth/Throat:     Mouth: Mucous membranes are moist.  Eyes:     Extraocular Movements: Extraocular movements intact.     Conjunctiva/sclera: Conjunctivae normal.     Pupils: Pupils are equal, round, and reactive to light.  Cardiovascular:     Rate and Rhythm: Normal rate and regular rhythm.     Pulses: Normal pulses.     Heart sounds: Normal heart sounds.  Pulmonary:     Effort: Pulmonary effort is normal.     Breath sounds: Normal breath sounds.  Abdominal:     General: Bowel sounds are normal. There is distension.     Palpations: Abdomen is soft.     Tenderness: There is abdominal tenderness.     Comments: Improved distention and abdominal tenderness since his most recent visit with me.  Musculoskeletal:        General: Normal range of motion.     Cervical back: Normal range of motion and neck supple.  Lymphadenopathy:     Cervical: No cervical adenopathy.  Skin:    General: Skin is warm and dry.     Capillary Refill: Capillary refill takes less than 2 seconds.  Neurological:     General: No focal deficit present.     Mental Status: He is alert and oriented to person, place, and time.  Psychiatric:        Attention and Perception: Attention and perception normal.        Mood and Affect: Mood is anxious and  depressed.        Speech: Speech normal.        Behavior: Behavior normal. Behavior is cooperative.        Thought Content: Thought content normal.        Cognition and Memory: Cognition and memory normal.        Judgment: Judgment normal.     Comments: Mood and anxiety continue to improve.   Today's Vitals   10/09/20 1103  BP: 103/67  Pulse: 99  Temp: (!) 97.3 F (36.3 C)  SpO2: 96%  Weight: 249 lb 9.6 oz (113.2 kg)  Height: 6' 1" (1.854 m)   Body mass index is 32.93 kg/m.   Wt Readings from Last 3 Encounters:  10/09/20 249 lb 9.6 oz (113.2 kg)  10/02/20 256 lb 6.4 oz (116.3 kg)  09/26/20 266 lb (120.7 kg)     Health Maintenance Due  Topic Date Due   COVID-19 Vaccine (1) Never done   Pneumococcal Vaccine 49-27 Years old (1 - PCV) Never done   TETANUS/TDAP  04/12/2018    There are no preventive care reminders to display for this patient.  Lab Results  Component Value Date   TSH 2.540 09/04/2020   Lab Results  Component Value Date   WBC 9.7 10/03/2020   HGB 11.7 (L) 10/03/2020   HCT 34.2 (L) 10/03/2020   MCV 96 10/03/2020   PLT 227 10/03/2020   Lab Results  Component Value Date   NA 141 10/02/2020   K 3.8 10/02/2020   CO2 20 10/02/2020   GLUCOSE 86 10/02/2020   BUN 10 10/02/2020   CREATININE 0.48 (L) 10/02/2020   BILITOT 2.8 (H) 10/02/2020   ALKPHOS 256 (H) 10/02/2020   AST 155 (H) 10/02/2020   ALT 73 (H) 10/02/2020   PROT 7.1 10/02/2020   ALBUMIN 3.9 (L) 10/02/2020   CALCIUM 8.8 10/02/2020   ANIONGAP 11 09/28/2020   EGFR 139 10/02/2020   Lab Results  Component Value Date   CHOL 223 (H) 09/04/2020   Lab Results  Component Value Date   HDL 27 (L) 09/04/2020   Lab Results  Component Value Date   LDLCALC 169 (H) 09/04/2020   Lab Results  Component Value Date   TRIG 146 09/04/2020   Lab Results  Component Value Date   CHOLHDL 8.3 (H) 09/04/2020   Lab Results  Component Value Date   HGBA1C 5.4 09/04/2020      Assessment & Plan:   1. Alcoholic cirrhosis of liver with ascites [facility]) Patient continuing to do well.  Continuing to have FerraLite protein drink daily to help with liver functions.  Patient continues to be alcohol free since hospital discharge.  Congratulated him on this accomplishment.  Will see a GI provider as scheduled.  Patient was given a release note stating that he is able to return to work on 10/14/2020.  2. Coagulopathy ([place]) Improved PT/INR since last visit.  We will continue to monitor.  3. Leukocytosis, unspecified type White blood cell counts improved but not quite normal.  We will continue to monitor.  4. Hypokalemia Potassium level back to normal levels.  5. Elevated liver function tests Liver function still moderately elevated.  Patient to see GI specialist for further evaluation and treatment.  6. Chronic post-traumatic stress disorder (PTSD)- history from being active duty Improved mood and sleep since increasing Minipress to 2 mg at bedtime.  Continue dosing as prescribed.  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    Ronnell Freshwater, NP

## 2020-10-10 ENCOUNTER — Ambulatory Visit: Payer: BC Managed Care – PPO | Admitting: Nurse Practitioner

## 2020-10-23 ENCOUNTER — Encounter: Payer: Self-pay | Admitting: Nurse Practitioner

## 2020-10-23 ENCOUNTER — Ambulatory Visit (INDEPENDENT_AMBULATORY_CARE_PROVIDER_SITE_OTHER): Payer: BC Managed Care – PPO | Admitting: Nurse Practitioner

## 2020-10-23 ENCOUNTER — Other Ambulatory Visit (INDEPENDENT_AMBULATORY_CARE_PROVIDER_SITE_OTHER): Payer: BC Managed Care – PPO

## 2020-10-23 VITALS — BP 124/66 | HR 72 | Ht 72.0 in | Wt 245.0 lb

## 2020-10-23 DIAGNOSIS — R7989 Other specified abnormal findings of blood chemistry: Secondary | ICD-10-CM

## 2020-10-23 DIAGNOSIS — K7031 Alcoholic cirrhosis of liver with ascites: Secondary | ICD-10-CM

## 2020-10-23 LAB — HEPATIC FUNCTION PANEL
ALT: 50 U/L (ref 0–53)
AST: 50 U/L — ABNORMAL HIGH (ref 0–37)
Albumin: 4.3 g/dL (ref 3.5–5.2)
Alkaline Phosphatase: 210 U/L — ABNORMAL HIGH (ref 39–117)
Bilirubin, Direct: 0.9 mg/dL — ABNORMAL HIGH (ref 0.0–0.3)
Total Bilirubin: 2 mg/dL — ABNORMAL HIGH (ref 0.2–1.2)
Total Protein: 7.6 g/dL (ref 6.0–8.3)

## 2020-10-23 LAB — BASIC METABOLIC PANEL
BUN: 17 mg/dL (ref 6–23)
CO2: 24 mEq/L (ref 19–32)
Calcium: 9.6 mg/dL (ref 8.4–10.5)
Chloride: 104 mEq/L (ref 96–112)
Creatinine, Ser: 0.49 mg/dL (ref 0.40–1.50)
GFR: 133.49 mL/min (ref >60.00)
Glucose, Bld: 94 mg/dL (ref 70–99)
Potassium: 3.5 mEq/L (ref 3.5–5.1)
Sodium: 139 mEq/L (ref 135–145)

## 2020-10-23 LAB — PROTIME-INR
INR: 1.4 ratio — ABNORMAL HIGH (ref 0.8–1.0)
Prothrombin Time: 15.9 s — ABNORMAL HIGH (ref 9.6–13.1)

## 2020-10-23 MED ORDER — NADOLOL 20 MG PO TABS: 40.0000 mg | ORAL_TABLET | Freq: Every day | ORAL | 1 refills | Status: DC

## 2020-10-23 NOTE — Patient Instructions (Addendum)
LABS:  Lab work has been ordered for you today. Our lab is located in the basement. Press "B" on the elevator. The lab is located at the first door on the left as you exit the elevator.  MEDICATION: We have sent the following medication to your pharmacy for you to pick up at your convenience: Nadolol 20 MG tablet, take 2 tablets (40MG ) once a day.  RECOMMENDATIONS: We have scheduled you a nurse visit 11/06/20 at 1:30 pm to check your blood pressure and heart rate after the increase in your Nadolol. We have scheduled you a follow up with Dr. 11/08/20 on 12/11/20 at 1:50pm No alcohol ever. Continue 30 gram protein drink daily. Follow a low sodium diet.  It was great seeing you today! Thank you for entrusting me with your care and choosing [facility].  [facility], CRNP  Low-Sodium Eating Plan Sodium, which is an element that makes up salt, helps you maintain a healthy balance of fluids in your body. Too much sodium can increase your bloodpressure and cause fluid and waste to be held in your body. Your health care provider or dietitian may recommend following this plan if you have high blood pressure (hypertension), kidney disease, liver disease, or heart failure. Eating less sodium can help lower your blood pressure, reduce swelling, and protect your heart, liver, andkidneys. What are tips for following this plan? Reading food labels The Nutrition Facts label lists the amount of sodium in one serving of the food. If you eat more than one serving, you must multiply the listed amount of sodium by the number of servings. Choose foods with less than 140 mg of sodium per serving. Avoid foods with 300 mg of sodium or more per serving. Shopping  Look for lower-sodium products, often labeled as "low-sodium" or "no salt added." Always check the sodium content, even if foods are labeled as "unsalted" or "no salt added." Buy fresh foods. Avoid canned foods and pre-made or  frozen meals. Avoid canned, cured, or processed meats. Buy breads that have less than 80 mg of sodium per slice.  Cooking  Eat more home-cooked food and less restaurant, buffet, and fast food. Avoid adding salt when cooking. Use salt-free seasonings or herbs instead of table salt or sea salt. Check with your health care provider or pharmacist before using salt substitutes. Cook with plant-based oils, such as canola, sunflower, or olive oil.  Meal planning When eating at a restaurant, ask that your food be prepared with less salt or no salt, if possible. Avoid dishes labeled as brined, pickled, cured, smoked, or made with soy sauce, miso, or teriyaki sauce. Avoid foods that contain MSG (monosodium glutamate). MSG is sometimes added to Arnaldo Natal food, bouillon, and some canned foods. Make meals that can be grilled, baked, poached, roasted, or steamed. These are generally made with less sodium. General information Most people on this plan should limit their sodium intake to 1,500-2,000 mg (milligrams) of sodium each day. What foods should I eat? Fruits Fresh, frozen, or canned fruit. Fruit juice. Vegetables Fresh or frozen vegetables. "No salt added" canned vegetables. "No salt added"tomato sauce and paste. Low-sodium or reduced-sodium tomato and vegetable juice. Grains Low-sodium cereals, including oats, puffed wheat and rice, and shredded wheat. Low-sodium crackers. Unsalted rice. Unsalted pasta. Low-sodium bread.Whole-grain breads and whole-grain pasta. Meats and other proteins Fresh or frozen (no salt added) meat, poultry, seafood, and fish. Low-sodium canned tuna and salmon. Unsalted nuts. Dried peas, beans, and lentils withoutadded salt. Unsalted canned  beans. Eggs. Unsalted nut butters. Dairy Milk. Soy milk. Cheese that is naturally low in sodium, such as ricotta cheese, fresh mozzarella, or Swiss cheese. Low-sodium or reduced-sodium cheese. Creamcheese. Yogurt. Seasonings and  condiments Fresh and dried herbs and spices. Salt-free seasonings. Low-sodium mustard and ketchup. Sodium-free salad dressing. Sodium-free light mayonnaise. Fresh orrefrigerated horseradish. Lemon juice. Vinegar. Other foods Homemade, reduced-sodium, or low-sodium soups. Unsalted popcorn and pretzels.Low-salt or salt-free chips. The items listed above may not be a complete list of foods and beverages you can eat. Contact a dietitian for more information. What foods should I avoid? Vegetables Sauerkraut, pickled vegetables, and relishes. Olives. [place] fries. Onion rings. Regular canned vegetables (not low-sodium or reduced-sodium). Regular canned tomato sauce and paste (not low-sodium or reduced-sodium). Regular tomato and vegetable juice (not low-sodium or reduced-sodium). Frozenvegetables in sauces. Grains Instant hot cereals. Bread stuffing, pancake, and biscuit mixes. Croutons. Seasoned rice or pasta mixes. Noodle soup cups. Boxed or frozen macaroni andcheese. Regular salted crackers. Self-rising flour. Meats and other proteins Meat or fish that is salted, canned, smoked, spiced, or pickled. Precooked or cured meat, such as sausages or meat loaves. Tomasa Blase. Ham. Pepperoni. Hot dogs. Corned beef. Chipped beef. Salt pork. Jerky. Pickled herring. Anchovies andsardines. Regular canned tuna. Salted nuts. Dairy Processed cheese and cheese spreads. Hard cheeses. Cheese curds. Blue cheese.Feta cheese. String cheese. Regular cottage cheese. Buttermilk. Canned milk. Fats and oils Salted butter. Regular margarine. Ghee. Bacon fat. Seasonings and condiments Onion salt, garlic salt, seasoned salt, table salt, and sea salt. Canned and packaged gravies. Worcestershire sauce. Tartar sauce. Barbecue sauce. Teriyaki sauce. Soy sauce, including reduced-sodium. Steak sauce. Fish sauce. Oyster sauce. Cocktail sauce. Horseradish that you find on the shelf. Regular ketchup and mustard. Meat flavorings and tenderizers.  Bouillon cubes. Hot sauce. Pre-made or packaged marinades. Pre-made or packaged taco seasonings. Relishes.Regular salad dressings. Salsa. Other foods Salted popcorn and pretzels. Corn chips and puffs. Potato and tortilla chips.Canned or dried soups. Pizza. Frozen entrees and pot pies. The items listed above may not be a complete list of foods and beverages you should avoid. Contact a dietitian for more information. Summary Eating less sodium can help lower your blood pressure, reduce swelling, and protect your heart, liver, and kidneys. Most people on this plan should limit their sodium intake to 1,500-2,000 mg (milligrams) of sodium each day. Canned, boxed, and frozen foods are high in sodium. Restaurant foods, fast foods, and pizza are also very high in sodium. You also get sodium by adding salt to food. Try to cook at home, eat more fresh fruits and vegetables, and eat less fast food and canned, processed, or prepared foods. This information is not intended to replace advice given to you by your health care provider. Make sure you discuss any questions you have with your healthcare provider. Document Revised: 05/04/2019 Document Reviewed: 02/28/2019 Elsevier Patient Education  2022 Elsevier Inc.  The [place] [facility] providers would like to encourage you to use [facility] to communicate with providers for non-urgent requests or questions.  Due to long hold times on the telephone, sending your provider a message by [facility] may be faster and more efficient way to get a response. Please allow 48 business hours for a response.  Please remember that this is for non-urgent requests/questions.  If you are age 75 or older, your body mass index should be between 23-30. Your Body mass index is 33.23 kg/m. If this is out of the aforementioned range listed, please consider follow up with your Primary Care  Provider.  If you are age 75 or younger, your body mass index should be between 19-25. Your Body mass index  is 33.23 kg/m. If this is out of the aformentioned range listed, please consider follow up with your Primary Care Provider.

## 2020-10-23 NOTE — Progress Notes (Signed)
10/23/2020 Albert Hall [id number] 08-10-1985   CHIEF COMPLAINT: Cirrhosis follow up   HISTORY OF PRESENT ILLNESS: Albert Hall is a 35 year old male with a past medical history of anxiety, hypertension, GERD, alcohol use disorder recently diagnosed with cirrhosis with esophageal varices and portal hypertensive gastropathy. He was admitted to the hospital 09/23/2020 due to having N/V/D and abdominal pain with alcohol associated hepatitis and newly diagnosed cirrhosis.   Labs in the ED showed a sodium level 133.  Potassium 2.9.  Glucose 105.  BUN less than 5.  Creatinine 0.54.  Alk Phos 212.  Albumin 2.7. Alk phos 212 AST 126.  ALT 68.  Total bili 5.7.  Lipase 23.  WBC 14.1.  Hemoglobin 12.3.  Hematocrit 36.6.  Platelet 229.  INR 1.3. Madrey's discriminant function was less than 32 therefore he did not require prednisolone.  CTAP with contrast showed severe hepatic steatosis with evidence of developing cirrhosis, splenomegaly and small volume of ascites.  An indeterminate lesion in the small bowel mesentery with an unusual appearance was noted.  Trace left pleural effusion.  A paracentesis was attempted ordered but was insufficient amount of ascites to tap. He underwent an EGD 09/26/2020 by Dr. Rush Landmark which showed grade 1 and grade 2 esophageal varices noted distally and portal hypertensive gastropathy.  Gastric biopsies were negative for H. pylori. PPI twice daily was continued.  CIWA protocol was maintained throughout his hospital admission. Overall, his clinical status improved and he was discharged home on Nadolol 29m QD 09/28/2020.    He presents to our office today for cirrhosis follow-up.  He is accompanied by his wife.  He remains alcohol free since 09/22/2020.  He did not pursue AA or alcohol rehab support.  His energy level is slowly improving.  He returned back to work last week, he was off work prior to his hospital admission due to having back surgery.  No confusion. No N/V. No  abdominal pain. He is passing a normal solid yellowish brown color twice daily. No blood or black stool. He is urinating light yellow urine. He has a little swelling to his ankles, R > L.  No abdominal swelling.  He remains on Nadolol 249mQD, Omeprazole 20 mg 1 p.o. twice daily, folic acid 1 mg daily and thiamine 100 mg daily.  He is taking Naldolol 2026mt bed time.  He is eating a low sodium diet and he is drinking a 30 g protein shake daily.     Laboratory studies 09/17/2020: Hepatitis A IgM negative.  Hepatitis B surface antigen negative.  Hepatitis C IgM negative.  Hepatitis C antibody less than 0.1.  Laboratory studies 09/25/2020: ANA negative.  SMA 18.  AMA < 20.  Iron 58.  Ferritin 323.  Ceruloplasmin 25.2.  Alpha 1 antitrypsin 256 (no A1AT deficiency).  IgG 1020.  Hepatitis B post 31.5 consistent with immunity.  Hep B core total antibody nonreactive.  Hepatitis A total antibody reactive.  Ammonia 59.  CBC Latest Ref Rng & Units 10/03/2020 09/27/2020 09/26/2020  WBC 3.4 - 10.8 x10E3/uL 9.7 8.3 8.4  Hemoglobin 13.0 - 17.7 g/dL 11.7(L) 10.3(L) 10.4(L)  Hematocrit 37.5 - 51.0 % 34.2(L) 30.9(L) 30.0(L)  Platelets 150 - 450 x10E3/uL 227 161 163    CMP Latest Ref Rng & Units 10/02/2020 09/28/2020 09/27/2020  Glucose 65 - 99 mg/dL 86 98 76  BUN 6 - 20 mg/dL 10 8 6   Creatinine 0.76 - 1.27 mg/dL 0.48(L) 0.55(L) 0.56(L)  Sodium 134 - 144  mmol/L 141 137 136  Potassium 3.5 - 5.2 mmol/L 3.8 3.7 3.3(L)  Chloride 96 - 106 mmol/L 102 101 102  CO2 20 - 29 mmol/L 20 25 27   Calcium 8.7 - 10.2 mg/dL 8.8 8.7(L) 8.5(L)  Total Protein 6.0 - 8.5 g/dL 7.1 - 6.2(L)  Total Bilirubin 0.0 - 1.2 mg/dL 2.8(H) - 4.0(H)  Alkaline Phos 44 - 121 IU/L 256(H) - 179(H)  AST 0 - 40 IU/L 155(H) - 125(H)  ALT 0 - 44 IU/L 73(H) - 54(H)     EGD 09/26/2020 Dr. Rush Landmark: - No gross lesions in esophagus proximally. - Grade I and grade II esophageal varices noted distally. - Z-line irregular, 44 cm from the incisors. - No gastric  varices. - Portal hypertensive gastropathy noted. - Erythematous mucosa in the antrum noted. - Stomach biopsied for HP. - No gross lesions in the duodenal bulb, in the first portion of the duodenum and in the second portion of the duodenum. A. STOMACH, BIOPSY:  - Mild reactive gastropathy.  - Warthin-Starry is negative for Helicobacter pylori.  - No intestinal metaplasia, dysplasia, or malignancy.   CTAP with contrast 09/23/2020: 1. Severe hepatic steatosis with evidence of developing cirrhosis. 2. Splenomegaly, which could suggest developing portal venous hypertension. 3. Small volume of ascites. 4. Indeterminate lesion in the small bowel mesentery which has an unusual appearance. This may represent an enlarged lymph node or other mesenteric mass such as a desmoid tumor. Repeat contrast enhanced CT the abdomen and pelvis is recommended in 3 months to ensure the stability or regression of this lesion 5. Trace left pleural effusion lying dependently  Past Medical History:  Diagnosis Date   Anxiety    Cirrhosis ([place])    Generalized anxiety disorder 01/08/2014   GERD (gastroesophageal reflux disease)    Hypertension    Insomnia 01/08/2014   Male sexual dysfunction    Tobacco abuse 02/11/2014   Past Surgical History:  Procedure Laterality Date   BIOPSY  09/26/2020   Procedure: BIOPSY;  Surgeon: Irving Copas., MD;  [facility];  Service: Gastroenterology;;   ESOPHAGOGASTRODUODENOSCOPY N/A 09/26/2020   Procedure: ESOPHAGOGASTRODUODENOSCOPY (EGD);  Surgeon: Irving Copas., MD;  Location: [facility];  Service: Gastroenterology;  Laterality: N/A;   LUMBAR LAMINECTOMY/ DECOMPRESSION WITH MET-RX Right 07/24/2020   Procedure: Right Lumbar Four-Five Minimally Invasive Microdiscectomy with Metrx;  Surgeon: Karsten Ro, DO;  Location: [facility];  Service: Neurosurgery;  Laterality: Right;   Social History:  He is married. He has one daughter. No alcohol since  09/22/2020. No drug use.   Family History: family history includes Alcoholism in his father and mother; Asthma in his father and sister; Depression in his father; Diabetes in his paternal uncle; High blood pressure in his mother.  Allergies  Allergen Reactions   Penicillins Anaphylaxis   Shrimp (Diagnostic) Hives   Gabapentin     Other reaction(s): Hallucinations   Hydrochlorothiazide    Iodine    Trazodone And Nefazodone Other (See Comments)    Affected hearing, malaise     Outpatient Encounter Medications as of 10/23/2020  Medication Sig   ALPRAZolam (XANAX) 0.5 MG tablet Take 1 tablet (0.5 mg total) by mouth daily as needed for anxiety.   amLODipine (NORVASC) 5 MG tablet Take 1 tablet (5 mg total) by mouth daily.   escitalopram (LEXAPRO) 10 MG tablet Take 1.5 tablets (15 mg total) by mouth daily.   folic acid (FOLVITE) 1 MG tablet Take 1 tablet (1 mg total) by mouth daily.  methocarbamol (ROBAXIN) 500 MG tablet Take 1 tablet (500 mg total) by mouth every 6 (six) hours as needed for muscle spasms.   omeprazole (PRILOSEC) 20 MG capsule Take 1 capsule po BID for GERD   prazosin (MINIPRESS) 2 MG capsule Take 1 capsule (2 mg total) by mouth at bedtime.   thiamine 100 MG tablet Take 1 tablet (100 mg total) by mouth daily.   [DISCONTINUED] nadolol (CORGARD) 20 MG tablet Take 1 tablet (20 mg total) by mouth daily.   nadolol (CORGARD) 20 MG tablet Take 2 tablets (40 mg total) by mouth daily.   [DISCONTINUED] ondansetron (ZOFRAN) 4 MG tablet Take 1 tablet (4 mg total) by mouth every 6 (six) hours as needed for nausea.   [DISCONTINUED] polyethylene glycol (MIRALAX / GLYCOLAX) 17 g packet Take 17 g by mouth daily as needed for mild constipation.   [DISCONTINUED] Vitamin D, Ergocalciferol, (DRISDOL) 50000 units CAPS capsule Take 1 capsule (50,000 Units total) by mouth every 7 (seven) days.   No facility-administered encounter medications on file as of 10/23/2020.    REVIEW OF SYSTEMS:  Gen:  Denies fever, sweats or chills. No weight loss.  CV: Denies chest pain, palpitations or edema. Resp: Denies cough, shortness of breath of hemoptysis.  GI: See HPI.  GU : Denies urinary burning, blood in urine, increased urinary frequency or incontinence. MS: + Hip and knee pain.  Derm: Denies rash, itchiness, skin lesions or unhealing ulcers. Psych: + Anxiety. Heme: Denies bruising, bleeding. Neuro:  Denies headaches, dizziness or paresthesias. Endo:  Denies any problems with DM, thyroid or adrenal function.  PHYSICAL EXAM: BP 124/66   Pulse 72   Ht 6' (1.829 m)   Wt 245 lb (111.1 kg)   BMI 33.23 kg/m   General: 35 year old male in no acute distress. Head: Normocephalic and atraumatic. Eyes:  Sclerae non-icteric with erythematous injection (he stated occurred due to sleeping with his contact lenses in.  Conjunctive pink. Ears: Normal auditory acuity. Mouth: Scattered missing dentition.  No ulcers or lesions.  Neck: Supple, no lymphadenopathy or thyromegaly.  Lungs: Clear bilaterally to auscultation without wheezes, crackles or rhonchi. Heart: Regular rate and rhythm. No murmur, rub or gallop appreciated.  Abdomen: Soft, nontender, non distended. No masses. No hepatosplenomegaly. Normoactive bowel sounds x 4 quadrants.  No ascites.  Striae present. Rectal: Deferred. Musculoskeletal: Symmetrical with no gross deformities. Skin: Warm and dry. No rash or lesions on visible extremities. No jaundice.  Extremities: No edema. Neurological: Alert oriented x 4, no focal deficits.  Psychological:  Alert and cooperative. Normal mood and affect.  ASSESSMENT AND PLAN:  52.  35 year old male admitted to the hospital 09/23/2020 with N/V/D, abdominal pain, elevated LFTs, alcohol related hepatitis and newly diagnosed cirrhosis (Etoh + severe hepatic steatosis) with portal hypertension, esophageal varices, small amount of ascites (not enough for a paracentesis) and splenomegaly.  -Hepatic panel,  BMP, CBC, INR -Increase Nadolol 71m take two tabs (418m po QD. RN office appointment in 2 weeks to check HR and BP -Surveillance EGD due June 2023 -2gm low sodium diet. Continue protein shake 30gm daily -No alcohol ever, recommended for patient to join alcoholics anonymous -No NSAIDs -Surveillance abdominal sonogram and AFP due 03/2021 -Follow-up in the office with Dr. MaRush Landmarkn 6 weeks  2. GERD -Continue Omeprazole 2052mo bid for now -See plan in # 1  3. CTAP 09/23/2020 showed an indeterminate lesion in the small bowel mesentery which has an unusual appearance. This may represent an enlarged lymph node or  other mesenteric mass such as a desmoid tumor.  -Follow-up with PCP to consider repeat imaging studies in 3 months     CC:  Ronnell Freshwater, NP

## 2020-10-24 LAB — CBC
HCT: 34.8 % — ABNORMAL LOW (ref 39.0–52.0)
Hemoglobin: 12.1 g/dL — ABNORMAL LOW (ref 13.0–17.0)
MCHC: 34.8 g/dL (ref 30.0–36.0)
MCV: 94.7 fl (ref 78.0–100.0)
Platelets: 191 10*3/uL (ref 150.0–400.0)
RBC: 3.67 Mil/uL — ABNORMAL LOW (ref 4.22–5.81)
RDW: 15.7 % — ABNORMAL HIGH (ref 11.5–15.5)
WBC: 8.6 10*3/uL (ref 4.0–10.5)

## 2020-10-24 NOTE — Progress Notes (Signed)
Attending Physician's Attestation   I have reviewed the chart.   I agree with the Advanced Practitioner's note, impression, and recommendations with any updates as below. Hopefully labs also show clinical improvement compared to hospitalization.   Corliss Parish, MD [facility] Office # [phone]

## 2020-10-28 ENCOUNTER — Other Ambulatory Visit: Payer: Self-pay | Admitting: Nurse Practitioner

## 2020-11-04 ENCOUNTER — Telehealth: Payer: Self-pay | Admitting: Nurse Practitioner

## 2020-11-04 NOTE — Telephone Encounter (Signed)
Called pt LVM stating that he will have his FMLA pw by Friday no later than Monday per Herbert Seta, also stating to call office if he has any question

## 2020-11-04 NOTE — Telephone Encounter (Signed)
Patient called into office to inquire about the status on his FMLA paperwork that was dropped off on 10/27/20.  Please call patient and advise when it will be ready.

## 2020-11-04 NOTE — Telephone Encounter (Signed)
error 

## 2020-11-05 ENCOUNTER — Other Ambulatory Visit: Payer: Self-pay | Admitting: Nurse Practitioner

## 2020-11-05 ENCOUNTER — Telehealth: Payer: Self-pay

## 2020-11-05 ENCOUNTER — Other Ambulatory Visit: Payer: Self-pay

## 2020-11-05 DIAGNOSIS — K219 Gastro-esophageal reflux disease without esophagitis: Secondary | ICD-10-CM

## 2020-11-05 DIAGNOSIS — K7031 Alcoholic cirrhosis of liver with ascites: Secondary | ICD-10-CM

## 2020-11-05 NOTE — Telephone Encounter (Signed)
Ok  Will do  °Thank you  °

## 2020-11-05 NOTE — Telephone Encounter (Signed)
Called pt spouse she is advised that the paper work is at the front desk ready to be pick up.KM

## 2020-11-05 NOTE — Telephone Encounter (Signed)
Heather,  We have a 5 to 7 day turn around on FMLA paperwork.  It has already been a week since the patient turned in his paperwork.  This needs to get completed today, no later than tomorrow. Thanks so much!!

## 2020-11-14 ENCOUNTER — Other Ambulatory Visit: Payer: Self-pay | Admitting: Nurse Practitioner

## 2020-11-14 DIAGNOSIS — F4310 Post-traumatic stress disorder, unspecified: Secondary | ICD-10-CM

## 2020-11-15 ENCOUNTER — Other Ambulatory Visit: Payer: Self-pay | Admitting: Nurse Practitioner

## 2020-11-15 DIAGNOSIS — F4310 Post-traumatic stress disorder, unspecified: Secondary | ICD-10-CM

## 2020-11-15 MED ORDER — ALPRAZOLAM 0.5 MG PO TABS: 0.5000 mg | ORAL_TABLET | Freq: Every day | ORAL | 0 refills | Status: DC | PRN

## 2020-11-15 NOTE — Progress Notes (Signed)
New prescription for alprazolam 0.5mg  daily as needed for acute anxiety sent to [facility]. Follow up as scheduled.

## 2020-11-25 ENCOUNTER — Encounter: Payer: Self-pay | Admitting: Nurse Practitioner

## 2020-11-25 ENCOUNTER — Other Ambulatory Visit: Payer: Self-pay

## 2020-11-25 ENCOUNTER — Ambulatory Visit (INDEPENDENT_AMBULATORY_CARE_PROVIDER_SITE_OTHER): Payer: BC Managed Care – PPO | Admitting: Nurse Practitioner

## 2020-11-25 VITALS — BP 103/65 | HR 71 | Temp 97.8°F | Ht 72.0 in | Wt 241.4 lb

## 2020-11-25 DIAGNOSIS — I10 Essential (primary) hypertension: Secondary | ICD-10-CM

## 2020-11-25 DIAGNOSIS — F5102 Adjustment insomnia: Secondary | ICD-10-CM

## 2020-11-25 DIAGNOSIS — K7031 Alcoholic cirrhosis of liver with ascites: Secondary | ICD-10-CM

## 2020-11-25 DIAGNOSIS — D689 Coagulation defect, unspecified: Secondary | ICD-10-CM

## 2020-11-25 DIAGNOSIS — F411 Generalized anxiety disorder: Secondary | ICD-10-CM

## 2020-11-25 DIAGNOSIS — F4310 Post-traumatic stress disorder, unspecified: Secondary | ICD-10-CM | POA: Diagnosis not present

## 2020-11-25 MED ORDER — AMLODIPINE BESYLATE 5 MG PO TABS: 5.0000 mg | ORAL_TABLET | Freq: Every day | ORAL | 1 refills | Status: DC

## 2020-11-25 MED ORDER — ALPRAZOLAM 0.5 MG PO TABS: 0.5000 mg | ORAL_TABLET | Freq: Every day | ORAL | 1 refills | Status: DC | PRN

## 2020-11-25 MED ORDER — ESCITALOPRAM OXALATE 10 MG PO TABS: 15.0000 mg | ORAL_TABLET | Freq: Every day | ORAL | 1 refills | Status: DC

## 2020-11-25 NOTE — Progress Notes (Signed)
Established Patient Office Visit  Subjective:  Patient ID: Albert Hall, male    DOB: 12/01/1985  Age: 35 y.o. MRN: [id number]  CC:  Chief Complaint  Patient presents with   Follow-up    HPI Albert Hall presents for routine follow-up visit.  Patient is now seeing GI provider.  Has seen them since last visit with me.  He does have improved liver functions.  ALT now normal.  AST and alkaline phosphatase are much improved.  His PT and INR are still high.  These are being monitored per GI provider.  He has still been alcohol free since his hospitalization.  He is having a 30 g protein shake daily.  Abdominal swelling has improved significantly.  Has lost muscle mass in arms and legs.  He is gradually working on building up strength. He continues to have some trouble sleeping.  States that taking melatonin along with the prazosin helps the most.  He does take alprazolam on very rare instances.  Uses mostly for situational stress associated with crowded environments.  This helps tremendously.  He does need a new prescription for alprazolam.  I did review his PDMP profile today.  His overdose or score is 280.  We will treated for last refill of alprazolam was 8 09/15/2020.  This was a stable prescription with no refills. The patient has no other concerns or complaints today.he denies chest pain, chest pressure, or shortness of breath. He denies headaches or visual disturbances. He denies abdominal pain, nausea, vomiting, or changes in bowel or bladder habits.    Past Medical History:  Diagnosis Date   Anxiety    Cirrhosis ([place])    Generalized anxiety disorder 01/08/2014   GERD (gastroesophageal reflux disease)    Hypertension    Insomnia 01/08/2014   Male sexual dysfunction    Tobacco abuse 02/11/2014    Past Surgical History:  Procedure Laterality Date   BIOPSY  09/26/2020   Procedure: BIOPSY;  Surgeon: Rush Landmark Telford Nab., MD;  Location: [facility];  Service: Gastroenterology;;    ESOPHAGOGASTRODUODENOSCOPY N/A 09/26/2020   Procedure: ESOPHAGOGASTRODUODENOSCOPY (EGD);  Surgeon: Irving Copas., MD;  Location: [facility];  Service: Gastroenterology;  Laterality: N/A;   LUMBAR LAMINECTOMY/ DECOMPRESSION WITH MET-RX Right 07/24/2020   Procedure: Right Lumbar Four-Five Minimally Invasive Microdiscectomy with Metrx;  Surgeon: Karsten Ro, DO;  Location: [facility];  Service: Neurosurgery;  Laterality: Right;    Family History  Problem Relation Age of Onset   Alcoholism Mother    High blood pressure Mother    Asthma Father    Alcoholism Father    Depression Father    Asthma Sister    Diabetes Paternal Uncle    Cancer Neg Hx    Stroke Neg Hx    CAD Neg Hx     Social History   Socioeconomic History   Marital status: Married    Spouse name: Water quality scientist   Number of children: 0   Years of education: 14   Highest education level: Not on file  Occupational History   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   Activity: no regular exercise   Diet: good water, fruits/vegetables daily   Social Determinants of Health   Financial Resource Strain: Not on file  Food Insecurity: Not on file  Transportation Needs: Not on file  Physical Activity: Not on file  Stress: Not on file  Social Connections: Not on file  Intimate Partner Violence: Not on file    Outpatient Medications Prior to Visit  Medication Sig Dispense  Refill   folic acid (FOLVITE) 1 MG tablet TAKE 1 TABLET BY MOUTH DAILY. 90 tablet 1   methocarbamol (ROBAXIN) 500 MG tablet Take 1 tablet (500 mg total) by mouth every 6 (six) hours as needed for muscle spasms. 90 tablet 1   nadolol (CORGARD) 20 MG tablet Take 2 tablets (40 mg total) by mouth daily. 60 tablet 1   omeprazole (PRILOSEC) 20 MG capsule TAKE 1 CAPSULE BY MOUTH TWICE DAILY FOR GERD 60 capsule 1   prazosin (MINIPRESS) 2 MG capsule Take 1 capsule (2 mg total) by mouth at bedtime. 30 capsule 2   thiamine 100 MG tablet Take 1 tablet (100 mg total) by mouth daily. 30 tablet 0   ALPRAZolam (XANAX) 0.5 MG tablet Take 1 tablet (0.5 mg total) by mouth daily as needed for anxiety. 30 tablet 0   amLODipine (NORVASC) 5 MG tablet Take 1 tablet (5 mg total) by mouth daily. 90 tablet 1   escitalopram (LEXAPRO) 10 MG tablet Take 1.5 tablets (15 mg total) by mouth daily. 135 tablet 0   No facility-administered medications prior to visit.    Allergies  Allergen Reactions   Penicillins Anaphylaxis   Shrimp (Diagnostic) Hives   Gabapentin     Other reaction(s): Hallucinations   Hydrochlorothiazide    Iodine    Trazodone And Nefazodone Other (See Comments)    Affected hearing, malaise    ROS Review of Systems  Constitutional:  Positive for fatigue. Negative for activity change, appetite change, chills and fever.       Activity and fatigue levels have both improved since his last visit.   HENT:  Negative for congestion, postnasal drip, rhinorrhea, sinus pressure and sinus pain.   Eyes: Negative.   Respiratory:  Negative for cough, chest tightness, shortness of breath and wheezing.   Cardiovascular:  Negative for chest pain and palpitations.  Gastrointestinal:  Positive for abdominal distention. Negative for abdominal pain, constipation, diarrhea, nausea and vomiting.       Abdominal swelling continues to improve.   Endocrine: Negative for cold intolerance, heat intolerance, polydipsia and  polyuria.  Genitourinary: Negative.   Musculoskeletal:  Positive for back pain. Negative for arthralgias and myalgias.  Skin:  Negative for rash.  Allergic/Immunologic: Negative for environmental allergies.  Neurological:  Negative for dizziness, weakness and headaches.  Psychiatric/Behavioral:  Positive for dysphoric mood and sleep disturbance. The patient is nervous/anxious.        Continues to be alcohol free since his hospitalization. He states that he does has trouble with falling and staying asleep. States that taking melatonin along with prazosin is very helpful.      Objective:    Physical Exam Vitals and nursing note reviewed.  Constitutional:      Appearance: Normal appearance.  He is well-developed. He is obese.  HENT:     Head: Normocephalic and atraumatic.     Nose: Nose normal.     Mouth/Throat:     Mouth: Mucous membranes are moist.  Eyes:     Extraocular Movements: Extraocular movements intact.     Conjunctiva/sclera: Conjunctivae normal.     Pupils: Pupils are equal, round, and reactive to light.  Cardiovascular:     Rate and Rhythm: Normal rate and regular rhythm.     Pulses: Normal pulses.     Heart sounds: Normal heart sounds.  Pulmonary:     Effort: Pulmonary effort is normal.     Breath sounds: Normal breath sounds.  Abdominal:     General: Bowel sounds are normal. There is distension.     Palpations: Abdomen is soft.     Tenderness: There is abdominal tenderness.     Comments: Abdominal tenderness and distension are improved   Musculoskeletal:        General: Normal range of motion.     Cervical back: Normal range of motion and neck supple.  Lymphadenopathy:     Cervical: No cervical adenopathy.  Skin:    General: Skin is warm and dry.     Capillary Refill: Capillary refill takes less than 2 seconds.  Neurological:     General: No focal deficit present.     Mental Status: He is alert and oriented to person, place, and time.  Psychiatric:         Attention and Perception: Attention and perception normal.        Mood and Affect: Mood is anxious and depressed.        Speech: Speech normal.        Behavior: Behavior normal. Behavior is cooperative.        Thought Content: Thought content normal.        Cognition and Memory: Cognition and memory normal.        Judgment: Judgment normal.     Comments: Mood and anxiety continue to improve.    Today's Vitals   11/25/20 1336  BP: 103/65  Pulse: 71  Temp: 97.8 F (36.6 C)  SpO2: 99%  Weight: 241 lb 6.4 oz (109.5 kg)  Height: 6' (1.829 m)   Body mass index is 32.74 kg/m.   Wt Readings from Last 3 Encounters:  11/25/20 241 lb 6.4 oz (109.5 kg)  10/23/20 245 lb (111.1 kg)  10/09/20 249 lb 9.6 oz (113.2 kg)     Health Maintenance Due  Topic Date Due   COVID-19 Vaccine (1) Never done   Pneumococcal Vaccine 28-55 Years old (1 - PCV) Never done   TETANUS/TDAP  04/12/2018   INFLUENZA VACCINE  11/10/2020    There are no preventive care reminders to display for this patient.  Lab Results  Component Value Date   TSH 2.540 09/04/2020   Lab Results  Component Value Date   WBC 8.6 10/23/2020   HGB 12.1 (L) 10/23/2020   HCT 34.8 (L) 10/23/2020   MCV 94.7 10/23/2020   PLT 191.0 10/23/2020   Lab Results  Component Value Date   NA 139 10/23/2020   K 3.5 10/23/2020   CO2 24 10/23/2020   GLUCOSE 94 10/23/2020   BUN 17 10/23/2020   CREATININE 0.49 10/23/2020   BILITOT 2.0 (H) 10/23/2020   ALKPHOS 210 (H) 10/23/2020   AST 50 (H) 10/23/2020   ALT 50 10/23/2020   PROT 7.6 10/23/2020   ALBUMIN 4.3 10/23/2020  CALCIUM 9.6 10/23/2020   ANIONGAP 11 09/28/2020   EGFR 139 10/02/2020   GFR 133.49 10/23/2020   Lab Results  Component Value Date   CHOL 223 (H) 09/04/2020   Lab Results  Component Value Date   HDL 27 (L) 09/04/2020   Lab Results  Component Value Date   LDLCALC 169 (H) 09/04/2020   Lab Results  Component Value Date   TRIG 146 09/04/2020   Lab Results   Component Value Date   CHOLHDL 8.3 (H) 09/04/2020   Lab Results  Component Value Date   HGBA1C 5.4 09/04/2020      Assessment & Plan:  1. Alcoholic cirrhosis of liver with ascites (HCC) GI symptoms continue to improve.  Patient is taking GI provider.  Liver functions improving.  ALT now normal.  AST and alkaline phosphatase are still elevated but much improved.  PT and INR are still elevated but are monitored routinely.  Patient should continue 30 g protein shakes every day.  And follow-up with GI provider as scheduled.  2. Essential hypertension Blood pressure stable.  Continue amlodipine and nadolol as prescribed. - amLODipine (NORVASC) 5 MG tablet; Take 1 tablet (5 mg total) by mouth daily.  Dispense: 90 tablet; Refill: 1  3. Generalized anxiety disorder/ PTSD Generalized anxiety improved overall.  Continue Lexapro 15 mg daily. - escitalopram (LEXAPRO) 10 MG tablet; Take 1.5 tablets (15 mg total) by mouth daily.  Dispense: 135 tablet; Refill: 1  4. Post traumatic stress disorder Patient may take alprazolam 0.5 mg tablets when needed for acute anxiety.  A new prescription for 30 tablets with 1 refill was sent to his pharmacy today. - ALPRAZolam (XANAX) 0.5 MG tablet; Take 1 tablet (0.5 mg total) by mouth daily as needed for anxiety.  Dispense: 30 tablet; Refill: 1  5. Insomnia due to psychological stress Advised patient that it is okay for him to take melatonin along with prazosin at night.  This is a safe and effective commendation.  We will continue to monitor.  6. Coagulopathy (HCC) PT/INR continues to be elevated.  Patient seen GI routinely.  PT/INR monitor closely.  Problem List Items Addressed This Visit       Cardiovascular and Mediastinum   Essential hypertension (Chronic)   Relevant Medications   amLODipine (NORVASC) 5 MG tablet     Digestive   Alcoholic cirrhosis of liver with ascites (HCC) - Primary     Hematopoietic and Hemostatic   Coagulopathy (HCC)      Other   Generalized anxiety disorder/ PTSD (Chronic)   Relevant Medications   escitalopram (LEXAPRO) 10 MG tablet   ALPRAZolam (XANAX) 0.5 MG tablet   Post traumatic stress disorder   Relevant Medications   escitalopram (LEXAPRO) 10 MG tablet   ALPRAZolam (XANAX) 0.5 MG tablet   Insomnia due to psychological stress    Meds ordered this encounter  Medications   amLODipine (NORVASC) 5 MG tablet    Sig: Take 1 tablet (5 mg total) by mouth daily.    Dispense:  90 tablet    Refill:  1    Order Specific Question:   Supervising Provider    Answer:   Beatrice Lecher D [[id number]]   escitalopram (LEXAPRO) 10 MG tablet    Sig: Take 1.5 tablets (15 mg total) by mouth daily.    Dispense:  135 tablet    Refill:  1    Order Specific Question:   Supervising Provider    Answer:   Beatrice Lecher D [[id number]]  ALPRAZolam (XANAX) 0.5 MG tablet    Sig: Take 1 tablet (0.5 mg total) by mouth daily as needed for anxiety.    Dispense:  30 tablet    Refill:  1    Order Specific Question:   Supervising Provider    Answer:   Beatrice Lecher D [[id number]]   This note was dictated using Dragon Voice Recognition Software. Rapid proofreading was performed to expedite the delivery of the information. Despite proofreading, phonetic errors will occur which are common with this voice recognition software. Please take this into consideration. If there are any concerns, please contact our office.    Follow-up: Return in about 3 months (around 02/25/2021) for bp, mood.    Ronnell Freshwater, NP

## 2020-12-03 ENCOUNTER — Telehealth: Payer: Self-pay

## 2020-12-03 NOTE — Telephone Encounter (Signed)
Called patient to remind it is time for follow up labs. No answer. Left information on his voicemail.

## 2020-12-11 ENCOUNTER — Ambulatory Visit: Payer: BC Managed Care – PPO | Admitting: Gastroenterology

## 2021-01-09 ENCOUNTER — Other Ambulatory Visit: Payer: Self-pay | Admitting: Nurse Practitioner

## 2021-01-27 ENCOUNTER — Other Ambulatory Visit (INDEPENDENT_AMBULATORY_CARE_PROVIDER_SITE_OTHER): Payer: BC Managed Care – PPO

## 2021-01-27 DIAGNOSIS — K7031 Alcoholic cirrhosis of liver with ascites: Secondary | ICD-10-CM | POA: Diagnosis not present

## 2021-01-27 LAB — CBC WITH DIFFERENTIAL/PLATELET
Basophils Absolute: 0.1 10*3/uL (ref 0.0–0.1)
Basophils Relative: 0.8 % (ref 0.0–3.0)
Eosinophils Absolute: 0.2 10*3/uL (ref 0.0–0.7)
Eosinophils Relative: 2.6 % (ref 0.0–5.0)
HCT: 39.2 % (ref 39.0–52.0)
Hemoglobin: 13 g/dL (ref 13.0–17.0)
Lymphocytes Relative: 21.4 % (ref 12.0–46.0)
Lymphs Abs: 1.5 10*3/uL (ref 0.7–4.0)
MCHC: 33.1 g/dL (ref 30.0–36.0)
MCV: 88.2 fl (ref 78.0–100.0)
Monocytes Absolute: 0.4 10*3/uL (ref 0.1–1.0)
Monocytes Relative: 5.8 % (ref 3.0–12.0)
Neutro Abs: 5 10*3/uL (ref 1.4–7.7)
Neutrophils Relative %: 69.4 % (ref 43.0–77.0)
Platelets: 156 10*3/uL (ref 150.0–400.0)
RBC: 4.45 Mil/uL (ref 4.22–5.81)
RDW: 14.4 % (ref 11.5–15.5)
WBC: 7.2 10*3/uL (ref 4.0–10.5)

## 2021-01-27 LAB — PROTIME-INR
INR: 1.4 ratio — ABNORMAL HIGH (ref 0.8–1.0)
Prothrombin Time: 15.6 s — ABNORMAL HIGH (ref 9.6–13.1)

## 2021-01-27 LAB — HEPATIC FUNCTION PANEL
ALT: 36 U/L (ref 0–53)
AST: 27 U/L (ref 0–37)
Albumin: 4.3 g/dL (ref 3.5–5.2)
Alkaline Phosphatase: 231 U/L — ABNORMAL HIGH (ref 39–117)
Bilirubin, Direct: 0.4 mg/dL — ABNORMAL HIGH (ref 0.0–0.3)
Total Bilirubin: 1.1 mg/dL (ref 0.2–1.2)
Total Protein: 7.5 g/dL (ref 6.0–8.3)

## 2021-01-30 ENCOUNTER — Other Ambulatory Visit: Payer: Self-pay | Admitting: Nurse Practitioner

## 2021-01-30 DIAGNOSIS — K219 Gastro-esophageal reflux disease without esophagitis: Secondary | ICD-10-CM

## 2021-02-06 ENCOUNTER — Ambulatory Visit (INDEPENDENT_AMBULATORY_CARE_PROVIDER_SITE_OTHER): Payer: BC Managed Care – PPO | Admitting: Gastroenterology

## 2021-02-06 ENCOUNTER — Encounter: Payer: Self-pay | Admitting: Gastroenterology

## 2021-02-06 VITALS — BP 110/60 | HR 63 | Ht 72.0 in | Wt 222.0 lb

## 2021-02-06 DIAGNOSIS — K7030 Alcoholic cirrhosis of liver without ascites: Secondary | ICD-10-CM

## 2021-02-06 DIAGNOSIS — F1091 Alcohol use, unspecified, in remission: Secondary | ICD-10-CM

## 2021-02-06 DIAGNOSIS — Z8719 Personal history of other diseases of the digestive system: Secondary | ICD-10-CM

## 2021-02-06 DIAGNOSIS — I8500 Esophageal varices without bleeding: Secondary | ICD-10-CM

## 2021-02-06 DIAGNOSIS — I8510 Secondary esophageal varices without bleeding: Secondary | ICD-10-CM | POA: Diagnosis not present

## 2021-02-06 MED ORDER — NADOLOL 20 MG PO TABS: 40.0000 mg | ORAL_TABLET | Freq: Every day | ORAL | 11 refills | Status: DC

## 2021-02-06 MED ORDER — VITAMIN K2 100 MCG PO CAPS: 2.0000 | ORAL_CAPSULE | Freq: Every day | ORAL | 0 refills | Status: AC

## 2021-02-06 NOTE — Patient Instructions (Addendum)
We have sent the following medications to your pharmacy for you to pick up at your convenience: Vitamin K 200 mcg (two 100 mcg capsules) daily x 1 month (then recheck labs)  Nadolol 2 tablets daily (40 mg total)  Please purchase the following medications over the counter and take as directed: Purchase omeprazole 20 mg over the counter and try this decreased dose. If doing well, let us know so we can send rx. If not, you may go back to 40 mg daily dosing. ____________________________________________________  Your provider has requested that you go to the basement level for lab work on 03/09/21 any time between 8 am and 5 pm. Press "B" on the elevator. The lab is located at the first door on the left as you exit the elevator. (INR)  Your provider has requested that you go to the basement level for lab work on 05/08/21 any time between 8 am-5pm. Press "B" on the elevator. The lab is located at the first door on the left as you exit the elevator. (CBC, CMP, INR)  ____________________________________________________  Please follow up with Dr Meridee Score in 6 months.  ____________________________________________________  Bonita Quin will be due for a recall endsocopy in 09/2021. We will send you a reminder in the mail when it gets closer to that time.  _______________________________________________________ If you are age 35 or older, your body mass index should be between 23-30. Your Body mass index is 30.11 kg/m. If this is out of the aforementioned range listed, please consider follow up with your Primary Care Provider.  If you are age 35 or younger, your body mass index should be between 19-25. Your Body mass index is 30.11 kg/m. If this is out of the aformentioned range listed, please consider follow up with your Primary Care Provider.   ________________________________________________________  The [facility] providers would like to encourage you to use [facility] to communicate with providers for  non-urgent requests or questions.  Due to long hold times on the telephone, sending your provider a message by [facility] may be a faster and more efficient way to get a response.  Please allow 48 business hours for a response.  Please remember that this is for non-urgent requests.  _______________________________________________________

## 2021-02-06 NOTE — Progress Notes (Signed)
[place] VISIT   Primary Care Provider Ronnell Freshwater, NP [place] [address] [phone]   Patient Profile: Albert Hall is a 35 y.o. male with a pmh significant for chronic alcoholic liver disease with recent hospitalization this year for alcoholic hepatitis (complicated by portal hypertension manifested as esophageal varices and portal gastropathy), hypertension, generalized anxiety disorder, prior alcohol use disorder (in remission), tobacco use disorder.  The patient presents to the [facility] for an evaluation and management of problem(s) noted below:  Problem List 1. Alcoholic cirrhosis of liver without ascites ([place])   2. Secondary esophageal varices without bleeding ([place])   3. Alcohol use disorder in remission   4. H/O acute alcoholic hepatitis     History of Present Illness Please see prior inpatient progress notes and outpatient clinic notes for full details of HPI.  Interval History The patient presents with his wife for a scheduled follow-up.  This is the first time I have seen the patient since June but he is doing exceedingly well.  He has lost approximately 20 pounds since he is no longer drinking.  His energy levels have increased.  He is in complete alcohol cessation for the last 5 months.  Patient's wife states that he is so much better and he is playing with his daughter more than he had before because he has more energy.  Patient denies any new abdominal pain or alteration of bowel habits. The patient denies any issues with jaundice, scleral icterus, generalized pruritus, darkened/amber urine, clay-colored stools, LE edema, hematemesis, coffee-ground emesis, abdominal distention, confusion.  The patient denies any gastrointestinal bleeding (melena/maroon stool/hematochezia).  GI Review of Systems Positive as above Negative for dysphagia, odynophagia, nausea, vomiting, pain, alteration  bowel habits  Review of Systems General: Positive as noted above for intentional weight loss; denies fevers/chills HEENT: Denies oral lesions Cardiovascular: Denies chest pain Pulmonary: Denies shortness of breath Gastroenterological: See HPI Genitourinary: Denies darkened urine Hematological: Denies easy bruising/bleeding Dermatological: Denies jaundice Psychological: Mood is stable   Medications Current Outpatient Medications  Medication Sig Dispense Refill   ALPRAZolam (XANAX) 0.5 MG tablet Take 1 tablet (0.5 mg total) by mouth daily as needed for anxiety. 30 tablet 1   amLODipine (NORVASC) 5 MG tablet Take 1 tablet (5 mg total) by mouth daily. 90 tablet 1   escitalopram (LEXAPRO) 10 MG tablet Take 1.5 tablets (15 mg total) by mouth daily. 175 tablet 1   folic acid (FOLVITE) 1 MG tablet TAKE 1 TABLET BY MOUTH DAILY. 90 tablet 1   Menaquinone-7 (VITAMIN K2) 100 MCG CAPS Take 2 capsules by mouth daily. X 1 month, then recheck labs. 1 capsule 0   methocarbamol (ROBAXIN) 500 MG tablet Take 1 tablet (500 mg total) by mouth every 6 (six) hours as needed for muscle spasms. 90 tablet 1   omeprazole (PRILOSEC) 20 MG capsule TAKE 1 CAPSULE BY MOUTH TWICE DAILY FOR GERD 60 capsule 1   prazosin (MINIPRESS) 2 MG capsule Take 1 capsule (2 mg total) by mouth at bedtime. 30 capsule 2   thiamine 100 MG tablet Take 1 tablet (100 mg total) by mouth daily. 30 tablet 0   nadolol (CORGARD) 20 MG tablet Take 2 tablets (40 mg total) by mouth daily. 60 tablet 11   No current facility-administered medications for this visit.    Allergies Allergies  Allergen Reactions   Penicillins Anaphylaxis   Shrimp (Diagnostic) Hives   Gabapentin  Other reaction(s): Hallucinations   Hydrochlorothiazide    Iodine    Trazodone And Nefazodone Other (See Comments)    Affected hearing, malaise    Histories Past Medical History:  Diagnosis Date   Anxiety    Cirrhosis ([place])    Generalized anxiety disorder  01/08/2014   GERD (gastroesophageal reflux disease)    Hypertension    Insomnia 01/08/2014   Male sexual dysfunction    Tobacco abuse 02/11/2014   Past Surgical History:  Procedure Laterality Date   BIOPSY  09/26/2020   Procedure: BIOPSY;  Surgeon: Irving Copas., MD;  Location: [facility];  Service: Gastroenterology;;   ESOPHAGOGASTRODUODENOSCOPY N/A 09/26/2020   Procedure: ESOPHAGOGASTRODUODENOSCOPY (EGD);  Surgeon: Irving Copas., MD;  Location: [facility];  Service: Gastroenterology;  Laterality: N/A;   LUMBAR LAMINECTOMY/ DECOMPRESSION WITH MET-RX Right 07/24/2020   Procedure: Right Lumbar Four-Five Minimally Invasive Microdiscectomy with Metrx;  Surgeon: Karsten Ro, DO;  Location: [facility];  Service: Neurosurgery;  Laterality: Right;   UPPER GASTROINTESTINAL ENDOSCOPY     Social History   Socioeconomic History   Marital status: Married    Spouse name: Water quality scientist   Number of children: 0   Years of education: 14   Highest education level: Not on file  Occupational History   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  Tobacco Use   Smoking status: Every Day    Packs/day: 0.25    Years: 3.00    Pack years: 0.75    Types: Cigarettes   Smokeless tobacco: Former  Scientific laboratory technician Use: Every day   Substances: Nicotine  Substance and Sexual Activity   Alcohol use: Not Currently    Alcohol/week: 7.0 - 8.0 standard drinks    Types: 7 Cans of beer per week    Comment: no alc 09-2020   Drug use: No   Sexual activity: Yes    Partners: Female  Other Topics Concern   Not on file  Social History Narrative   Lives with wife and baby daughter, german shepherd and 2 cats   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   Activity: no regular exercise   Diet: good water, fruits/vegetables daily   Social Determinants of Radio broadcast assistant Strain: Not on file  Food Insecurity: Not on file  Transportation Needs: Not on file  Physical  Activity: Not on file  Stress: Not on file  Social Connections: Not on file  Intimate Partner Violence: Not on file   Family History  Problem Relation Age of Onset   Alcoholism Mother    High blood pressure Mother    Asthma Father    Alcoholism Father    Depression Father    Asthma Sister    Diabetes Paternal Uncle    Cancer Neg Hx    Stroke Neg Hx    CAD Neg Hx    Colon cancer Neg Hx    Stomach cancer Neg Hx    Esophageal cancer Neg Hx    Pancreatic cancer Neg Hx    Inflammatory bowel disease Neg Hx    Rectal cancer Neg Hx    I have reviewed his medical, social, and family history in detail and updated the electronic medical record as necessary.    PHYSICAL EXAMINATION  BP 110/60   Pulse 63   Ht 6' (1.829 m)   Wt 222 lb (100.7 kg)   SpO2 98%   BMI 30.11 kg/m  Wt Readings from Last 3 Encounters:  02/06/21 222  lb (100.7 kg)  11/25/20 241 lb 6.4 oz (109.5 kg)  10/23/20 245 lb (111.1 kg)  GEN: NAD, appears stated age, doesn't appear chronically ill, accompanied by wife PSYCH: Cooperative, without pressured speech EYE: Conjunctivae pink, sclerae anicteric ENT: MMM CV: Nontachycardic RESP: No audible wheezing GI: NABS, soft, protuberant abdomen, rounded, nontender, hepatomegaly is appreciated, splenomegaly not appreciated, without rebound or guarding MSK/EXT: Trace bilateral pedal edema SKIN: Spider angiomata noted on upper thorax; no jaundice NEURO:  Alert & Oriented x 3, no focal deficits, no evidence of asterixis   REVIEW OF DATA  I reviewed the following data at the time of this encounter:  GI Procedures and Studies  Previously reviewed  Laboratory Studies  Reviewed those in epic and care everywhere  Imaging Studies  No new imaging studies to review   ASSESSMENT  Mr. Khawaja is a 35 y.o. male with a pmh significant for chronic alcoholic liver disease with recent hospitalization this year for alcoholic hepatitis (complicated by portal hypertension  manifested as esophageal varices and portal gastropathy), hypertension, generalized anxiety disorder, prior alcohol use disorder (in remission), tobacco use disorder.  The patient is seen today for evaluation and management of:  1. Alcoholic cirrhosis of liver without ascites ([place])   2. Secondary esophageal varices without bleeding ([place])   3. Alcohol use disorder in remission   4. H/O acute alcoholic hepatitis    The patient is clinically and hemodynamically stable at this time.  I am very happy to hear how well he is done and how well he is doing.  My hope is that in 6 more months we will get a better assessment of whether his portal hypertension was driven by his alcohol consumption and whether there has been regeneration of his liver capacity versus if the patient has developed longstanding chronic scarring.  Time will tell.  The patient's INR does remain slightly elevated but he does not have evidence of overt thrombocytopenia at this time.  We will plan a repeat ultrasound with likely elastography in 2023.  We will also plan a repeat EGD to evaluate for persistence of esophageal varices.  For now he will continue on nonselective beta-blockade in an effort of trying to prevent any bleeding from his known varices.  He is already immune to hepatitis A and hepatitis B.  He is doing the best thing he can but not going back to alcohol and I foresee him doing well in the future.  We will keep him compensated at this time as best we can.  They will update [place] if other issues develop in the interim.  We will try to have the patient take vitamin K and recheck INR to see if that makes a difference and then repeat labs in approximately 3 months.  We will continue his current nadolol dose and try to decrease his PPI dose to once daily if he tolerates it.  All patient questions were answered to the best of my ability, and the patient agrees to the aforementioned plan of action with follow-up as indicated.   PLAN   Volume -No need for diuretics -Standing weight 2-3 times per week -1500-2000 mg Na diet Infection -No need for SBP prophylaxis is no ascites Bleeding -Repeat EGD in 2023 -Continue nadolol 40 mg daily (titrated to heart rates in the 60s) Encephalopathy -None Screening -We will repeat in 2023 after complete alcohol cessation and 1 year Transplant -Meld is low and unlikely to need anything at this time Vaccination -Immune to hepatitis A  and hepatitis B -Patient should consider vaccines to influenza, Pneumococcal  Vitamin K OTC and recheck INR in 1 month CBC/CMP/INR in 3 months Follow-up EGD in 2023 Follow-up in clinic in 2023 Right upper quadrant ultrasound for [place] screening in 2023   Orders Placed This Encounter  Procedures   INR/PT   CBC   Comp Met (CMET)   INR/PT     New Prescriptions   MENAQUINONE-7 (VITAMIN K2) 100 MCG CAPS    Take 2 capsules by mouth daily. X 1 month, then recheck labs.   Modified Medications   Modified Medication Previous Medication   NADOLOL (CORGARD) 20 MG TABLET nadolol (CORGARD) 20 MG tablet      Take 2 tablets (40 mg total) by mouth daily.    TAKE 2 TABLETS (40 MG TOTAL) BY MOUTH DAILY.    Planned Follow Up No follow-ups on file.   Total Time in Face-to-Face and in Coordination of Care for patient including independent/personal interpretation/review of prior testing, medical history, examination, medication adjustment, communicating results with the patient directly, and documentation within the EHR is 25 minutes.   Justice Britain, MD [facility] Office # [phone]

## 2021-02-07 DIAGNOSIS — F1091 Alcohol use, unspecified, in remission: Secondary | ICD-10-CM | POA: Insufficient documentation

## 2021-02-07 DIAGNOSIS — K7030 Alcoholic cirrhosis of liver without ascites: Secondary | ICD-10-CM | POA: Insufficient documentation

## 2021-02-07 DIAGNOSIS — Z8719 Personal history of other diseases of the digestive system: Secondary | ICD-10-CM | POA: Insufficient documentation

## 2021-02-07 DIAGNOSIS — I8510 Secondary esophageal varices without bleeding: Secondary | ICD-10-CM | POA: Insufficient documentation

## 2021-02-17 ENCOUNTER — Other Ambulatory Visit: Payer: Self-pay | Admitting: Nurse Practitioner

## 2021-02-17 DIAGNOSIS — F5104 Psychophysiologic insomnia: Secondary | ICD-10-CM

## 2021-02-25 ENCOUNTER — Encounter: Payer: Self-pay | Admitting: Nurse Practitioner

## 2021-02-25 ENCOUNTER — Ambulatory Visit (INDEPENDENT_AMBULATORY_CARE_PROVIDER_SITE_OTHER): Payer: BC Managed Care – PPO | Admitting: Nurse Practitioner

## 2021-02-25 ENCOUNTER — Other Ambulatory Visit: Payer: Self-pay

## 2021-02-25 VITALS — BP 108/67 | HR 61 | Temp 97.8°F | Ht 72.0 in | Wt 221.2 lb

## 2021-02-25 DIAGNOSIS — E538 Deficiency of other specified B group vitamins: Secondary | ICD-10-CM

## 2021-02-25 DIAGNOSIS — F5104 Psychophysiologic insomnia: Secondary | ICD-10-CM | POA: Diagnosis not present

## 2021-02-25 DIAGNOSIS — F4310 Post-traumatic stress disorder, unspecified: Secondary | ICD-10-CM

## 2021-02-25 DIAGNOSIS — Z6830 Body mass index (BMI) 30.0-30.9, adult: Secondary | ICD-10-CM

## 2021-02-25 DIAGNOSIS — F411 Generalized anxiety disorder: Secondary | ICD-10-CM | POA: Diagnosis not present

## 2021-02-25 DIAGNOSIS — I10 Essential (primary) hypertension: Secondary | ICD-10-CM

## 2021-02-25 MED ORDER — ALPRAZOLAM 0.5 MG PO TABS: 0.5000 mg | ORAL_TABLET | Freq: Every day | ORAL | 1 refills | Status: DC | PRN

## 2021-02-25 MED ORDER — AMLODIPINE BESYLATE 5 MG PO TABS: 5.0000 mg | ORAL_TABLET | Freq: Every day | ORAL | 1 refills | Status: DC

## 2021-02-25 MED ORDER — FOLIC ACID 1 MG PO TABS: 1.0000 mg | ORAL_TABLET | Freq: Every day | ORAL | 1 refills | Status: DC

## 2021-02-25 MED ORDER — ESCITALOPRAM OXALATE 10 MG PO TABS: 15.0000 mg | ORAL_TABLET | Freq: Every day | ORAL | 1 refills | Status: DC

## 2021-02-25 MED ORDER — PRAZOSIN HCL 2 MG PO CAPS: 2.0000 mg | ORAL_CAPSULE | Freq: Every day | ORAL | 2 refills | Status: DC

## 2021-02-25 NOTE — Progress Notes (Signed)
Established Patient Office Visit  Subjective:  Patient ID: Albert Hall, male    DOB: [birth year]  Age: 35 y.o. MRN: [id number]  CC:  Chief Complaint  Patient presents with   Hypertension    HPI Albert Hall presents for routine follow up. He states that at the end of each day, when he is trying to sleep, he has pain, mostly in the lower arms, hands, and fingers. Will sometimes occur in his lower legs. States that this has been going on for about a month. He states that this new symptom started when he ran out of protein shakes and couldn't be found to purchase anywhere.  He has since restarted them. He was also started on vitamin k about a week ago. He does not take anything to help pain due to chronic liver disease. Does not want to take anything until he knows everything is back to normal.  The patient states that hs is still alcohol free. He is now working on smoking cessation as well. He states that yesterday he smoked only three cigarettes per day. He does not smoke in front of his daughter.  He reports no new concerns or complaints today. He denies new problems or concerns. He denies chest pain, chest pressure, or shortness of breath. He denies headaches or visual disturbances. He denies abdominal pain, nausea, vomiting, or changes in bowel or bladder habits.  .3  Past Medical History:  Diagnosis Date   Anxiety    Cirrhosis ([place])    Generalized anxiety disorder 01/08/2014   GERD (gastroesophageal reflux disease)    Hypertension    Insomnia 01/08/2014   Male sexual dysfunction    Tobacco abuse 02/11/2014    Past Surgical History:  Procedure Laterality Date   BIOPSY  09/26/2020   Procedure: BIOPSY;  Surgeon: Rush Landmark Telford Nab., MD;  Location: [place];  Service: Gastroenterology;;   ESOPHAGOGASTRODUODENOSCOPY N/A 09/26/2020   Procedure: ESOPHAGOGASTRODUODENOSCOPY (EGD);  Surgeon: Irving Copas., MD;  Location: [facility];  Service: Gastroenterology;   Laterality: N/A;   LUMBAR LAMINECTOMY/ DECOMPRESSION WITH MET-RX Right 07/24/2020   Procedure: Right Lumbar Four-Five Minimally Invasive Microdiscectomy with Metrx;  Surgeon: Karsten Ro, DO;  Location: [facility];  Service: Neurosurgery;  Laterality: Right;   UPPER GASTROINTESTINAL ENDOSCOPY      Family History  Problem Relation Age of Onset   Alcoholism Mother    High blood pressure Mother    Asthma Father    Alcoholism Father    Depression Father    Asthma Sister    Diabetes Paternal Uncle    Cancer Neg Hx    Stroke Neg Hx    CAD Neg Hx    Colon cancer Neg Hx    Stomach cancer Neg Hx    Esophageal cancer Neg Hx    Pancreatic cancer Neg Hx    Inflammatory bowel disease Neg Hx    Rectal cancer Neg Hx     Social History   Socioeconomic History   Marital status: Married    Spouse name: Water quality scientist   Number of children: 0   Years of education: 14   Highest education level: Not on file  Occupational History   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  Tobacco Use   Smoking status: Every Day    Packs/day: 0.25    Years: 3.00    Pack years: 0.75    Types: Cigarettes   Smokeless tobacco: Former  Scientific laboratory technician Use: Every day   Substances: Nicotine  Substance and Sexual Activity   Alcohol use: Not Currently    Alcohol/week: 7.0 - 8.0 standard drinks    Types: 7 Cans of beer per week    Comment: no alc 09-2020   Drug use: No   Sexual activity: Yes    Partners: Female  Other Topics Concern   Not on file  Social History Narrative   Lives with wife and baby daughter, german shepherd and 2 cats   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   Activity: no regular exercise   Diet: good water, fruits/vegetables daily   Social Determinants of Health   Financial Resource Strain: Not on file  Food Insecurity: Not on file  Transportation Needs: Not on file  Physical Activity: Not on file  Stress: Not on file  Social Connections: Not on file  Intimate Partner  Violence: Not on file    Outpatient Medications Prior to Visit  Medication Sig Dispense Refill   Menaquinone-7 (VITAMIN K2) 100 MCG CAPS Take 2 capsules by mouth daily. X 1 month, then recheck labs. 1 capsule 0   methocarbamol (ROBAXIN) 500 MG tablet Take 1 tablet by mouth every 6 (six) hours as needed.     nadolol (CORGARD) 20 MG tablet Take 2 tablets (40 mg total) by mouth daily. 60 tablet 11   omeprazole (PRILOSEC) 20 MG capsule TAKE 1 CAPSULE BY MOUTH TWICE DAILY FOR GERD 60 capsule 1   thiamine 100 MG tablet Take 1 tablet (100 mg total) by mouth daily. 30 tablet 0   ALPRAZolam (XANAX) 0.5 MG tablet Take 1 tablet (0.5 mg total) by mouth daily as needed for anxiety. 30 tablet 1   amLODipine (NORVASC) 5 MG tablet Take 1 tablet (5 mg total) by mouth daily. 90 tablet 1   escitalopram (LEXAPRO) 10 MG tablet Take 1.5 tablets (15 mg total) by mouth daily. 924 tablet 1   folic acid (FOLVITE) 1 MG tablet TAKE 1 TABLET BY MOUTH DAILY. 90 tablet 1   prazosin (MINIPRESS) 2 MG capsule TAKE 1 CAPSULE BY MOUTH AT BEDTIME. 30 capsule 0   methocarbamol (ROBAXIN) 500 MG tablet Take 1 tablet (500 mg total) by mouth every 6 (six) hours as needed for muscle spasms. 90 tablet 1   No facility-administered medications prior to visit.    Allergies  Allergen Reactions   Penicillins Anaphylaxis   Shrimp (Diagnostic) Hives   Gabapentin     Other reaction(s): Hallucinations   Hydrochlorothiazide    Iodine    Trazodone And Nefazodone Other (See Comments)    Affected hearing, malaise    ROS Review of Systems  Constitutional:  Negative for activity change, appetite change, chills, fatigue and fever.       Patient has returned to work and normal activities. Overall, doing well.   HENT:  Negative for congestion, postnasal drip, rhinorrhea, sinus pressure and sinus pain.   Eyes: Negative.   Respiratory:  Negative for cough, chest tightness, shortness of breath and wheezing.   Cardiovascular:  Negative for  chest pain and palpitations.  Gastrointestinal:  Negative for abdominal distention, abdominal pain, constipation, diarrhea, nausea and vomiting.  Endocrine: Negative for cold intolerance, heat intolerance, polydipsia and polyuria.  Genitourinary: Negative.   Musculoskeletal:  Positive for arthralgias, back pain and myalgias.  Skin:  Negative for rash.  Allergic/Immunologic: Negative for environmental allergies.  Neurological:  Negative for dizziness, weakness and headaches.  Psychiatric/Behavioral:  Positive for dysphoric mood and sleep disturbance. The patient is nervous/anxious.  Continues to be alcohol free since his hospitalization. He states that he does has trouble with falling and staying asleep. States that taking melatonin along with prazosin is very helpful.      Objective:    Physical Exam Vitals and nursing note reviewed.  Constitutional:      Appearance: Normal appearance. He is well-developed.  HENT:     Head: Normocephalic and atraumatic.     Nose: Nose normal.     Mouth/Throat:     Mouth: Mucous membranes are moist.  Eyes:     Extraocular Movements: Extraocular movements intact.     Conjunctiva/sclera: Conjunctivae normal.     Pupils: Pupils are equal, round, and reactive to light.  Cardiovascular:     Rate and Rhythm: Normal rate and regular rhythm.     Pulses: Normal pulses.     Heart sounds: Normal heart sounds.  Pulmonary:     Effort: Pulmonary effort is normal.     Breath sounds: Normal breath sounds.  Abdominal:     General: Bowel sounds are normal. There is no distension.     Palpations: Abdomen is soft. There is no mass.     Tenderness: There is no abdominal tenderness. There is no guarding or rebound.     Hernia: No hernia is present.  Musculoskeletal:        General: Normal range of motion.     Cervical back: Normal range of motion and neck supple.  Lymphadenopathy:     Cervical: No cervical adenopathy.  Skin:    General: Skin is warm and  dry.     Capillary Refill: Capillary refill takes less than 2 seconds.  Neurological:     General: No focal deficit present.     Mental Status: He is alert and oriented to person, place, and time.  Psychiatric:        Attention and Perception: Attention and perception normal.        Mood and Affect: Affect normal. Mood is anxious.        Speech: Speech normal.        Behavior: Behavior normal. Behavior is cooperative.        Thought Content: Thought content normal.        Cognition and Memory: Cognition and memory normal.        Judgment: Judgment normal.   Today's Vitals   02/25/21 1347  BP: 108/67  Pulse: 61  Temp: 97.8 F (36.6 C)  SpO2: 100%  Weight: 221 lb 3.2 oz (100.3 kg)  Height: 6' (1.829 m)   Body mass index is 30 kg/m.   Wt Readings from Last 3 Encounters:  02/25/21 221 lb 3.2 oz (100.3 kg)  02/06/21 222 lb (100.7 kg)  11/25/20 241 lb 6.4 oz (109.5 kg)     There are no preventive care reminders to display for this patient.  There are no preventive care reminders to display for this patient.  Lab Results  Component Value Date   TSH 2.540 09/04/2020   Lab Results  Component Value Date   WBC 7.2 01/27/2021   HGB 13.0 01/27/2021   HCT 39.2 01/27/2021   MCV 88.2 01/27/2021   PLT 156.0 01/27/2021   Lab Results  Component Value Date   NA 139 10/23/2020   K 3.5 10/23/2020   CO2 24 10/23/2020   GLUCOSE 94 10/23/2020   BUN 17 10/23/2020   CREATININE 0.49 10/23/2020   BILITOT 1.1 01/27/2021   ALKPHOS 231 (H) 01/27/2021   AST  27 01/27/2021   ALT 36 01/27/2021   PROT 7.5 01/27/2021   ALBUMIN 4.3 01/27/2021   CALCIUM 9.6 10/23/2020   ANIONGAP 11 09/28/2020   EGFR 139 10/02/2020   GFR 133.49 10/23/2020   Lab Results  Component Value Date   CHOL 223 (H) 09/04/2020   Lab Results  Component Value Date   HDL 27 (L) 09/04/2020   Lab Results  Component Value Date   LDLCALC 169 (H) 09/04/2020   Lab Results  Component Value Date   TRIG 146  09/04/2020   Lab Results  Component Value Date   CHOLHDL 8.3 (H) 09/04/2020   Lab Results  Component Value Date   HGBA1C 5.4 09/04/2020      Assessment & Plan:  1. Essential hypertension Stable. Continue all bp medication as prescfribed  - amLODipine (NORVASC) 5 MG tablet; Take 1 tablet (5 mg total) by mouth daily.  Dispense: 90 tablet; Refill: 1  2. Generalized anxiety disorder/ PTSD Generally well managed. Continue lexapro as prescribed  - escitalopram (LEXAPRO) 10 MG tablet; Take 1.5 tablets (15 mg total) by mouth daily.  Dispense: 135 tablet; Refill: 1  3. Folate deficiency Continue folic acid supplement as prescribed. Refills provided today  - folic acid (FOLVITE) 1 MG tablet; Take 1 tablet (1 mg total) by mouth daily.  Dispense: 90 tablet; Refill: 1  4. Psychophysiological insomnia Better managed. Continue prazosin as prescribed  - prazosin (MINIPRESS) 2 MG capsule; Take 1 capsule (2 mg total) by mouth at bedtime.  Dispense: 90 capsule; Refill: 2  5. Post traumatic stress disorder May take alprazolam 0.38m once daily as needed for acute anxiety. A new prescription was sent to his pharmacy today.  - ALPRAZolam (XANAX) 0.5 MG tablet; Take 1 tablet (0.5 mg total) by mouth daily as needed for anxiety.  Dispense: 30 tablet; Refill: 1  6. Body mass index (BMI) of 30.0-30.9 in adult Encourage patient to limit calorie intake to 2000 cal/day or less.  He should consume a low cholesterol, low-fat diet.  Patient should incorporate exercise into his daily routine.    Problem List Items Addressed This Visit       Cardiovascular and Mediastinum   Essential hypertension - Primary (Chronic)   Relevant Medications   amLODipine (NORVASC) 5 MG tablet   prazosin (MINIPRESS) 2 MG capsule     Other   H/o Insomnia (Chronic)   Relevant Medications   prazosin (MINIPRESS) 2 MG capsule   Generalized anxiety disorder/ PTSD (Chronic)   Relevant Medications   escitalopram (LEXAPRO) 10 MG  tablet   ALPRAZolam (XANAX) 0.5 MG tablet   Post traumatic stress disorder   Relevant Medications   escitalopram (LEXAPRO) 10 MG tablet   ALPRAZolam (XANAX) 0.5 MG tablet   Folate deficiency   Relevant Medications   folic acid (FOLVITE) 1 MG tablet   Body mass index (BMI) of 30.0-30.9 in adult    Meds ordered this encounter  Medications   amLODipine (NORVASC) 5 MG tablet    Sig: Take 1 tablet (5 mg total) by mouth daily.    Dispense:  90 tablet    Refill:  1    Order Specific Question:   Supervising Provider    Answer:   MBeatrice LecherD [[id number]]   escitalopram (LEXAPRO) 10 MG tablet    Sig: Take 1.5 tablets (15 mg total) by mouth daily.    Dispense:  135 tablet    Refill:  1    Order Specific Question:   Supervising  Provider    Answer:   Beatrice Lecher D [[id number]]   folic acid (FOLVITE) 1 MG tablet    Sig: Take 1 tablet (1 mg total) by mouth daily.    Dispense:  90 tablet    Refill:  1    Order Specific Question:   Supervising Provider    Answer:   Beatrice Lecher D [[id number]]   prazosin (MINIPRESS) 2 MG capsule    Sig: Take 1 capsule (2 mg total) by mouth at bedtime.    Dispense:  90 capsule    Refill:  2    Order Specific Question:   Supervising Provider    Answer:   Beatrice Lecher D [[id number]]   ALPRAZolam (XANAX) 0.5 MG tablet    Sig: Take 1 tablet (0.5 mg total) by mouth daily as needed for anxiety.    Dispense:  30 tablet    Refill:  1    Order Specific Question:   Supervising Provider    Answer:   Beatrice Lecher D [[id number]]    Follow-up: Return in about 3 months (around 05/28/2021) for mood, med refills.    Ronnell Freshwater, NP

## 2021-03-04 NOTE — Progress Notes (Signed)
I have contacted patient to remind him that he is due for labwork on 03/09/21. He verbalizes understanding and states he will "do his best.'

## 2021-03-07 DIAGNOSIS — E538 Deficiency of other specified B group vitamins: Secondary | ICD-10-CM | POA: Insufficient documentation

## 2021-03-07 DIAGNOSIS — Z6834 Body mass index (BMI) 34.0-34.9, adult: Secondary | ICD-10-CM | POA: Insufficient documentation

## 2021-03-07 DIAGNOSIS — Z6830 Body mass index (BMI) 30.0-30.9, adult: Secondary | ICD-10-CM | POA: Insufficient documentation

## 2021-03-07 NOTE — Patient Instructions (Signed)
Fat and Cholesterol Restricted Eating Plan Getting too much fat and cholesterol in your diet may cause health problems. Choosing the right foods helps keep your fat and cholesterol at normal levels. This can keep you from getting certain diseases. Your doctor may recommend an eating plan that includes: Total fat: ______% or less of total calories a day. This is ______g of fat a day. Saturated fat: ______% or less of total calories a day. This is ______g of saturated fat a day. Cholesterol: less than _________mg a day. Fiber: ______g a day. What are tips for following this plan? General tips Work with your doctor to lose weight if you need to. Avoid: Foods with added sugar. Fried foods. Foods with trans fat or partially hydrogenated oils. This includes some margarines and baked goods. If you drink alcohol: Limit how much you have to: 0-1 drink a day for women who are not pregnant. 0-2 drinks a day for men. Know how much alcohol is in a drink. In the [place] one drink equals one 12 oz bottle of beer (355 mL), one 5 oz glass of wine (148 mL), or one 1 oz glass of hard liquor (44 mL). Reading food labels Check food labels for: Trans fats. Partially hydrogenated oils. Saturated fat (g) in each serving. Cholesterol (mg) in each serving. Fiber (g) in each serving. Choose foods with healthy fats, such as: Monounsaturated fats and polyunsaturated fats. These include olive and canola oil, flaxseeds, walnuts, almonds, and seeds. Omega-3 fats. These are found in certain fish, flaxseed oil, and ground flaxseeds. Choose grain products that have whole grains. Look for the word "whole" as the first word in the ingredient list. Cooking Cook foods using low-fat methods. These include baking, boiling, grilling, and broiling. Eat more home-cooked foods. Eat at restaurants and buffets less often. Eat less fast food. Avoid cooking using saturated fats, such as butter, cream, palm oil, palm kernel oil, and  coconut oil. Meal planning  At meals, divide your plate into four equal parts: Fill one-half of your plate with vegetables, green salads, and fruit. Fill one-fourth of your plate with whole grains. Fill one-fourth of your plate with low-fat (lean) protein foods. Eat fish that is high in omega-3 fats at least two times a week. This includes mackerel, tuna, sardines, and salmon. Eat foods that are high in fiber, such as whole grains, beans, apples, pears, berries, broccoli, carrots, peas, and barley. What foods should I eat? Fruits All fresh, canned (in natural juice), or frozen fruits. Vegetables Fresh or frozen vegetables (raw, steamed, roasted, or grilled). Green salads. Grains Whole grains, such as whole wheat or whole grain breads, crackers, cereals, and pasta. Unsweetened oatmeal, bulgur, barley, quinoa, or brown rice. Corn or whole wheat flour tortillas. Meats and other protein foods Ground beef (85% or leaner), grass-fed beef, or beef trimmed of fat. Skinless chicken or turkey. Ground chicken or turkey. Pork trimmed of fat. All fish and seafood. Egg whites. Dried beans, peas, or lentils. Unsalted nuts or seeds. Unsalted canned beans. Nut butters without added sugar or oil. Dairy Low-fat or nonfat dairy products, such as skim or 1% milk, 2% or reduced-fat cheeses, low-fat and fat-free ricotta or cottage cheese, or plain low-fat and nonfat yogurt. Fats and oils Tub margarine without trans fats. Light or reduced-fat mayonnaise and salad dressings. Avocado. Olive, canola, sesame, or safflower oils. The items listed above may not be a complete list of foods and beverages you can eat. Contact a dietitian for more information. What foods   should I avoid? Fruits Canned fruit in heavy syrup. Fruit in cream or butter sauce. Fried fruit. Vegetables Vegetables cooked in cheese, cream, or butter sauce. Fried vegetables. Grains White bread. White pasta. White rice. Cornbread. Bagels, pastries,  and croissants. Crackers and snack foods that contain trans fat and hydrogenated oils. Meats and other protein foods Fatty cuts of meat. Ribs, chicken wings, bacon, sausage, bologna, salami, chitterlings, fatback, hot dogs, bratwurst, and packaged lunch meats. Liver and organ meats. Whole eggs and egg yolks. Chicken and turkey with skin. Fried meat. Dairy Whole or 2% milk, cream, half-and-half, and cream cheese. Whole milk cheeses. Whole-fat or sweetened yogurt. Full-fat cheeses. Nondairy creamers and whipped toppings. Processed cheese, cheese spreads, and cheese curds. Fats and oils Butter, stick margarine, lard, shortening, ghee, or bacon fat. Coconut, palm kernel, and palm oils. Beverages Alcohol. Sugar-sweetened drinks such as sodas, lemonade, and fruit drinks. Sweets and desserts Corn syrup, sugars, honey, and molasses. Candy. Jam and jelly. Syrup. Sweetened cereals. Cookies, pies, cakes, donuts, muffins, and ice cream. The items listed above may not be a complete list of foods and beverages you should avoid. Contact a dietitian for more information. Summary Choosing the right foods helps keep your fat and cholesterol at normal levels. This can keep you from getting certain diseases. At meals, fill one-half of your plate with vegetables, green salads, and fruits. Eat high fiber foods, like whole grains, beans, apples, pears, berries, carrots, peas, and barley. Limit added sugar, saturated fats, alcohol, and fried foods. This information is not intended to replace advice given to you by your health care provider. Make sure you discuss any questions you have with your health care provider. Document Revised: 08/08/2020 Document Reviewed: 08/08/2020 Elsevier Patient Education  2022 [employer]  

## 2021-04-29 ENCOUNTER — Other Ambulatory Visit: Payer: Self-pay | Admitting: Nurse Practitioner

## 2021-04-29 DIAGNOSIS — K219 Gastro-esophageal reflux disease without esophagitis: Secondary | ICD-10-CM

## 2021-05-12 ENCOUNTER — Other Ambulatory Visit: Payer: Self-pay | Admitting: Nurse Practitioner

## 2021-05-12 DIAGNOSIS — F4310 Post-traumatic stress disorder, unspecified: Secondary | ICD-10-CM

## 2021-05-12 NOTE — Telephone Encounter (Signed)
Reviewed PDMP with score of 140 and appropriate fill history. Patient has follow up 05/27/2021

## 2021-05-27 ENCOUNTER — Ambulatory Visit: Payer: BC Managed Care – PPO | Admitting: Nurse Practitioner

## 2021-07-27 ENCOUNTER — Telehealth: Payer: Self-pay | Admitting: Gastroenterology

## 2021-07-27 NOTE — Telephone Encounter (Signed)
The pt wife has been advised to have the pt come in prior to his appt so that the results will be available at office visit. The pt has been advised of the information and verbalized understanding.    ?

## 2021-07-27 NOTE — Telephone Encounter (Signed)
Inbound call from patients wife stating that at the last OV with Dr. Rush Landmark that he was supposed to get labs and never did. Wife scheduled patient for his 6 month FU on 6/2 at 3:30 and is seeking advice if he needs to come in before his appointment for labs or if he can have them done on the day of his OV. Please advise.  ?

## 2021-08-12 DIAGNOSIS — M546 Pain in thoracic spine: Secondary | ICD-10-CM | POA: Diagnosis not present

## 2021-08-24 ENCOUNTER — Other Ambulatory Visit: Payer: Self-pay | Admitting: Nurse Practitioner

## 2021-08-24 DIAGNOSIS — K219 Gastro-esophageal reflux disease without esophagitis: Secondary | ICD-10-CM

## 2021-09-11 ENCOUNTER — Ambulatory Visit: Payer: BC Managed Care – PPO | Admitting: Gastroenterology

## 2021-09-23 ENCOUNTER — Other Ambulatory Visit (INDEPENDENT_AMBULATORY_CARE_PROVIDER_SITE_OTHER): Payer: BC Managed Care – PPO

## 2021-09-23 DIAGNOSIS — K7030 Alcoholic cirrhosis of liver without ascites: Secondary | ICD-10-CM | POA: Diagnosis not present

## 2021-09-23 DIAGNOSIS — I8510 Secondary esophageal varices without bleeding: Secondary | ICD-10-CM

## 2021-09-23 LAB — COMPREHENSIVE METABOLIC PANEL
ALT: 67 U/L — ABNORMAL HIGH (ref 0–53)
AST: 42 U/L — ABNORMAL HIGH (ref 0–37)
Albumin: 4.7 g/dL (ref 3.5–5.2)
Alkaline Phosphatase: 207 U/L — ABNORMAL HIGH (ref 39–117)
BUN: 12 mg/dL (ref 6–23)
CO2: 28 mEq/L (ref 19–32)
Calcium: 9.5 mg/dL (ref 8.4–10.5)
Chloride: 104 mEq/L (ref 96–112)
Creatinine, Ser: 0.7 mg/dL (ref 0.40–1.50)
GFR: 119.09 mL/min (ref >60.00)
Glucose, Bld: 107 mg/dL — ABNORMAL HIGH (ref 70–99)
Potassium: 4.3 mEq/L (ref 3.5–5.1)
Sodium: 141 mEq/L (ref 135–145)
Total Bilirubin: 1.3 mg/dL — ABNORMAL HIGH (ref 0.2–1.2)
Total Protein: 7.9 g/dL (ref 6.0–8.3)

## 2021-09-23 LAB — CBC
HCT: 42.3 % (ref 39.0–52.0)
Hemoglobin: 14.6 g/dL (ref 13.0–17.0)
MCHC: 34.5 g/dL (ref 30.0–36.0)
MCV: 89.4 fl (ref 78.0–100.0)
Platelets: 189 10*3/uL (ref 150.0–400.0)
RBC: 4.73 Mil/uL (ref 4.22–5.81)
RDW: 14.9 % (ref 11.5–15.5)
WBC: 8 10*3/uL (ref 4.0–10.5)

## 2021-09-23 LAB — PROTIME-INR
INR: 1.3 ratio — ABNORMAL HIGH (ref 0.8–1.0)
Prothrombin Time: 14.1 s — ABNORMAL HIGH (ref 9.6–13.1)

## 2021-09-28 ENCOUNTER — Other Ambulatory Visit: Payer: Self-pay

## 2021-09-28 DIAGNOSIS — K7030 Alcoholic cirrhosis of liver without ascites: Secondary | ICD-10-CM

## 2021-10-10 IMAGING — US US ABDOMEN LIMITED
1 series · 14 of 15 positions shown · non-contrast
Comparison: Body CT September 23, 2020

CLINICAL DATA: Abdominal distension.

EXAM:
LIMITED ABDOMEN ULTRASOUND FOR ASCITES
TECHNIQUE: Limited ultrasound survey for ascites was performed in all four
abdominal quadrants.

[Series 1: us abdomen limited · 14 of 15 slices shown]
[im 1/15]
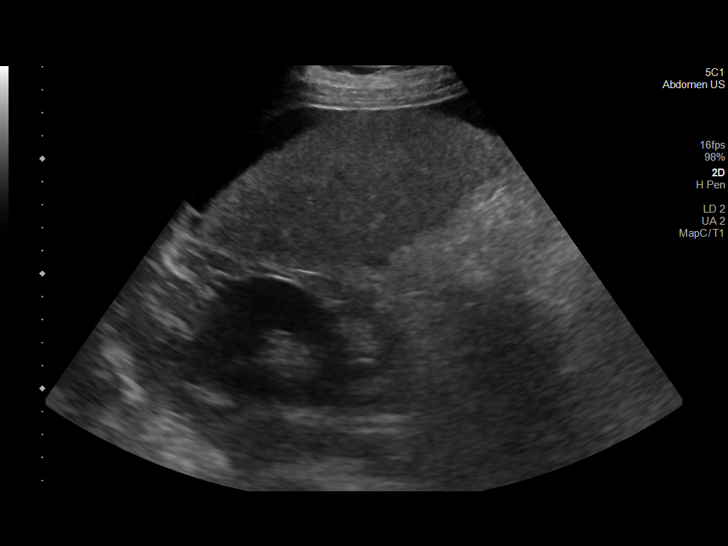
[im 2/15]
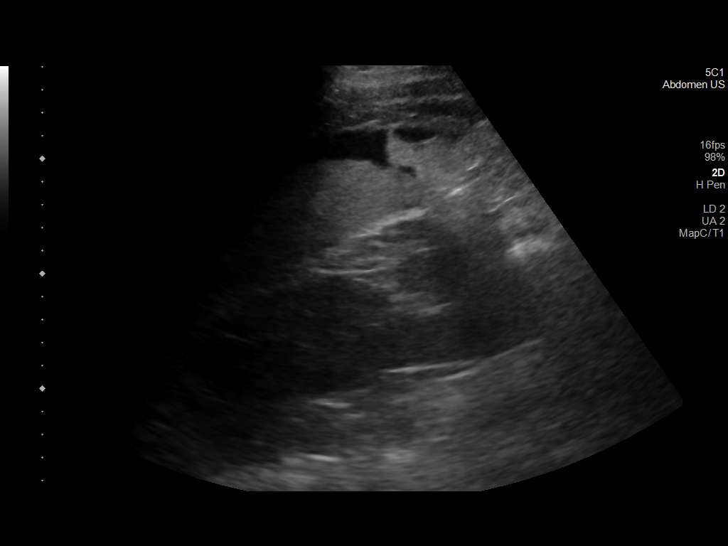
[im 3/15]
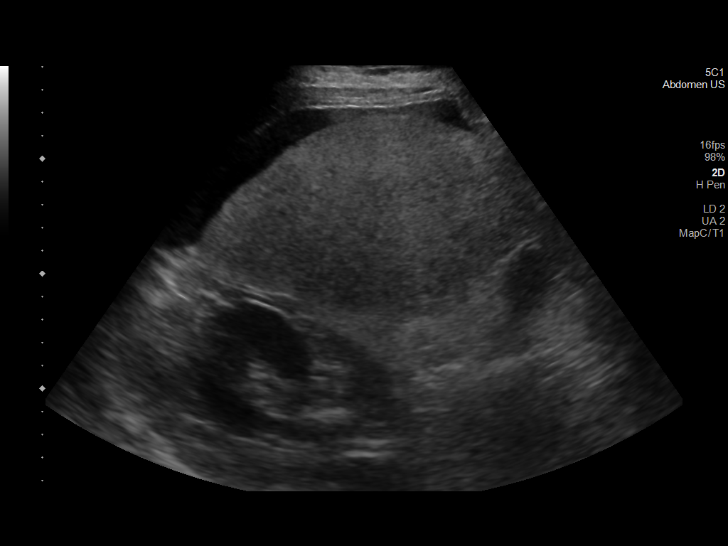
[im 4/15]
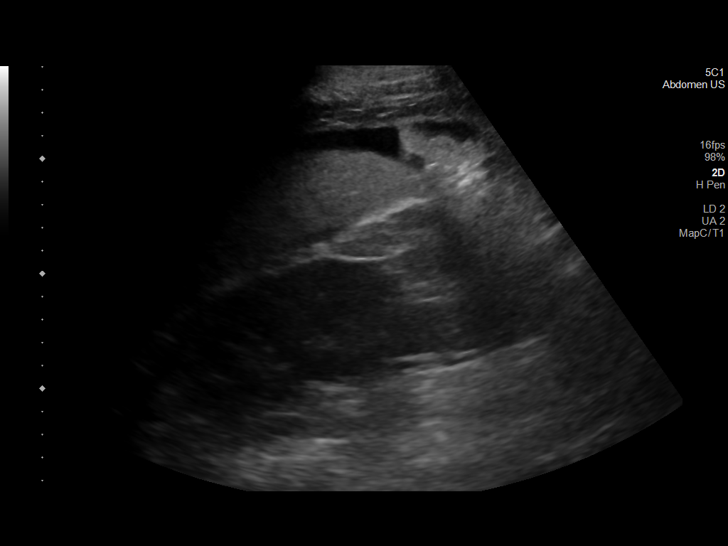
[im 5/15]
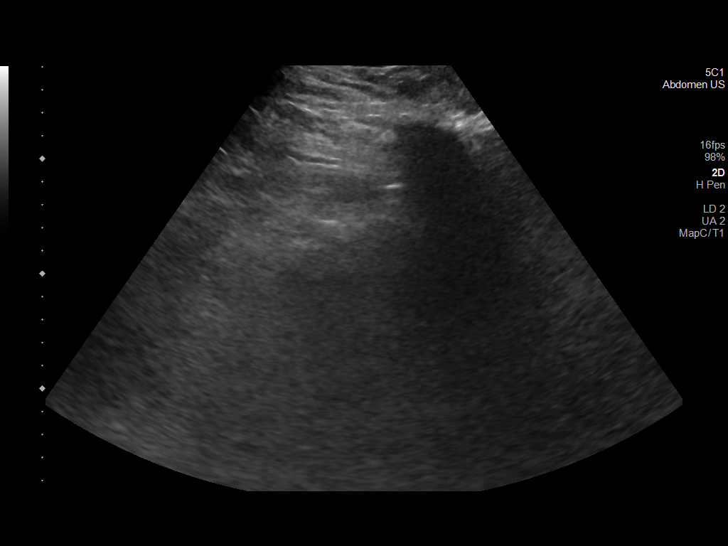
[im 6/15]
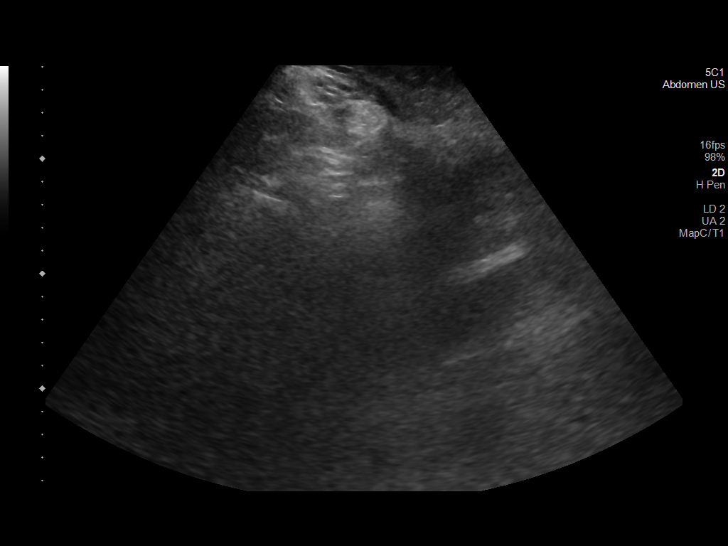
[im 7/15]
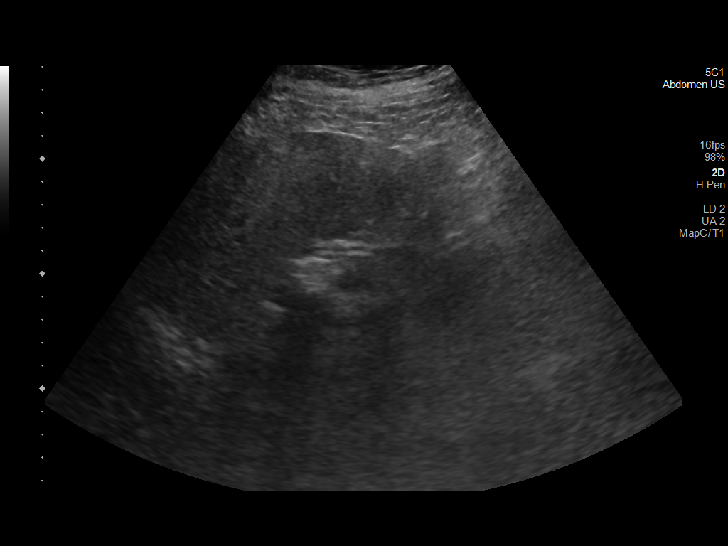
[im 9/15]
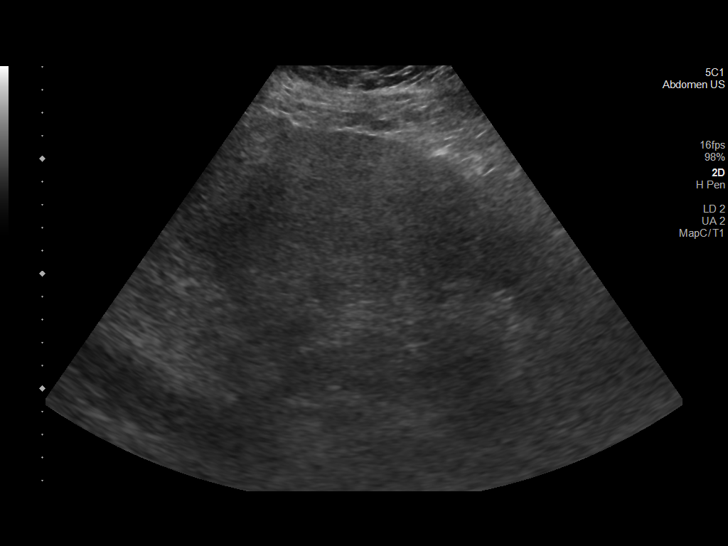
[im 10/15]
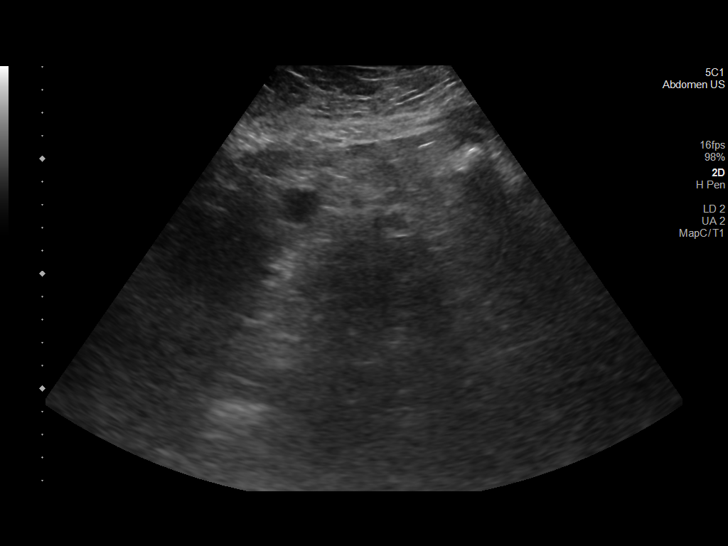
[im 11/15]
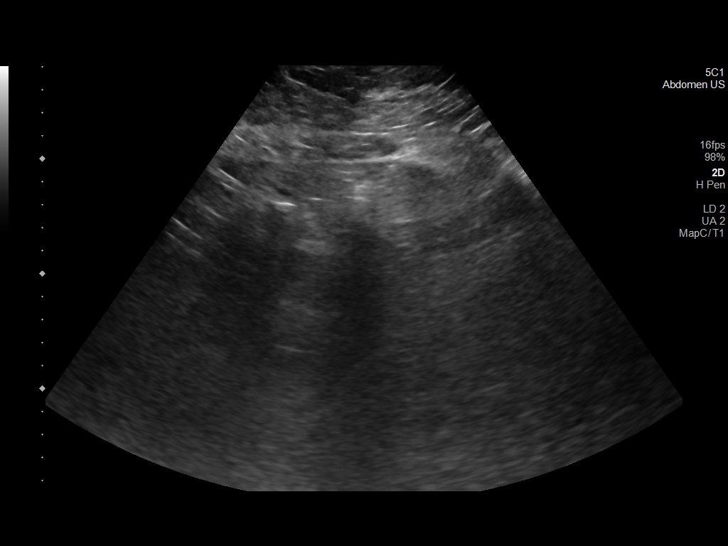
[im 12/15]
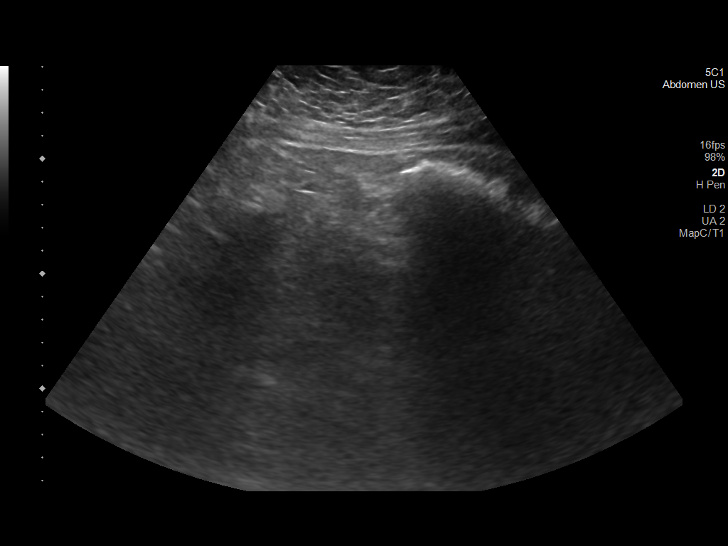
[im 13/15]
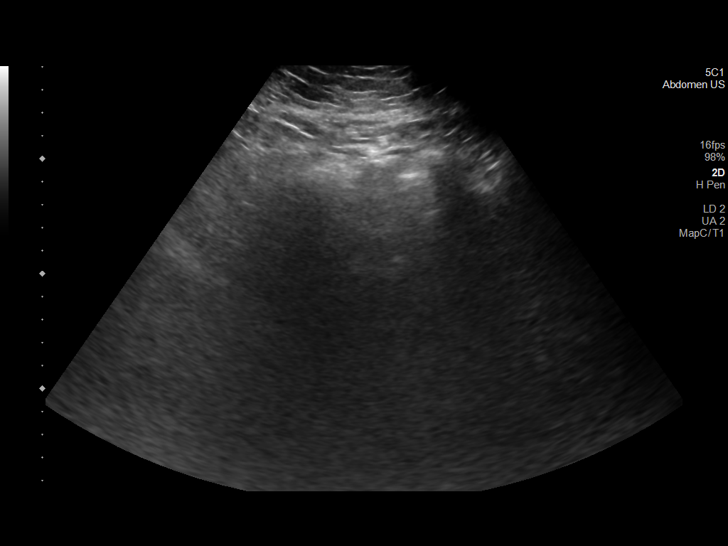
[im 14/15]
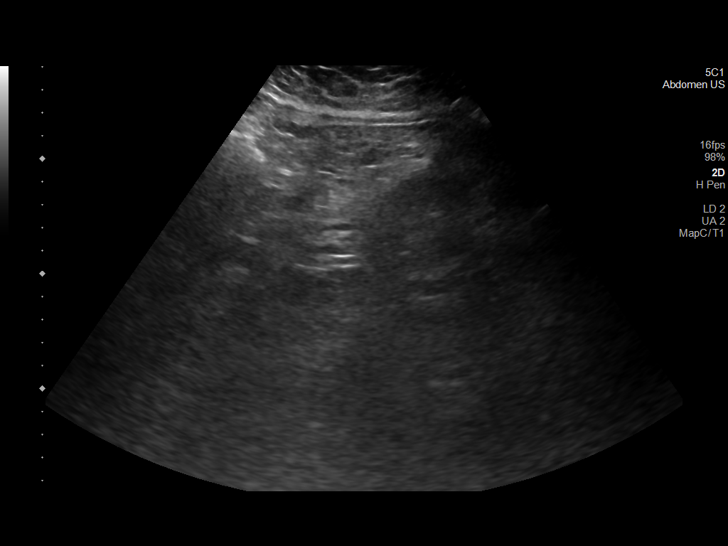
[im 15/15]
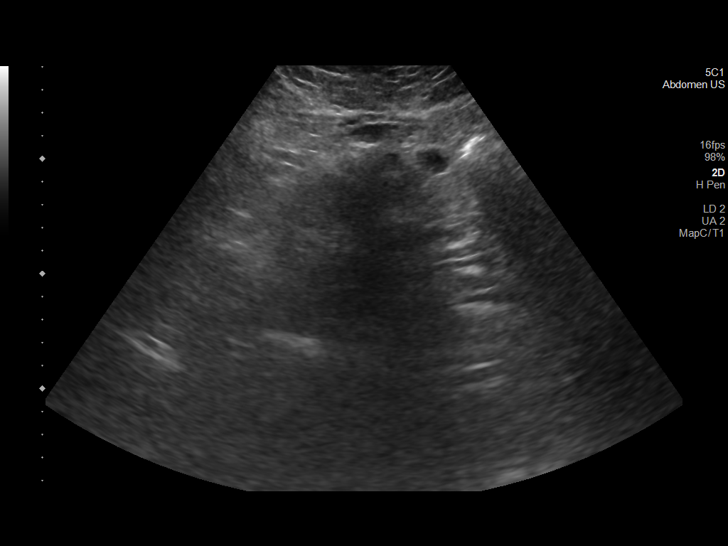

[14 of 15 positions shown; findings below may reference images not displayed]

FINDINGS: Examination of the all 4 quadrant of the abdomen demonstrates small
amount of right perihepatic ascites. Nodular contour of the liver is
noted.
IMPRESSION: Right upper quadrant perihepatic ascites.

Nodular contour of the liver usually seen with hepatic cirrhosis.

## 2021-10-22 ENCOUNTER — Ambulatory Visit (HOSPITAL_COMMUNITY): Payer: BC Managed Care – PPO

## 2021-10-28 ENCOUNTER — Ambulatory Visit: Payer: BC Managed Care – PPO | Admitting: Gastroenterology

## 2021-10-28 ENCOUNTER — Encounter: Payer: Self-pay | Admitting: Gastroenterology

## 2021-10-28 VITALS — BP 126/84 | HR 71 | Ht 72.0 in | Wt 254.0 lb

## 2021-10-28 DIAGNOSIS — K766 Portal hypertension: Secondary | ICD-10-CM | POA: Diagnosis not present

## 2021-10-28 DIAGNOSIS — R7989 Other specified abnormal findings of blood chemistry: Secondary | ICD-10-CM

## 2021-10-28 DIAGNOSIS — F1020 Alcohol dependence, uncomplicated: Secondary | ICD-10-CM | POA: Diagnosis not present

## 2021-10-28 DIAGNOSIS — K7030 Alcoholic cirrhosis of liver without ascites: Secondary | ICD-10-CM

## 2021-10-28 MED ORDER — OMEPRAZOLE 20 MG PO CPDR: DELAYED_RELEASE_CAPSULE | ORAL | 6 refills | Status: DC

## 2021-10-28 MED ORDER — NADOLOL 20 MG PO TABS: 40.0000 mg | ORAL_TABLET | Freq: Every day | ORAL | 6 refills | Status: AC

## 2021-10-28 NOTE — Progress Notes (Signed)
[place] VISIT   Primary Care Provider Ronnell Freshwater, NP [place] [address] [phone]   Patient Profile: Albert Hall is a 35 y.o. male with a pmh significant for chronic alcoholic liver disease with prior alcoholic hepatitis (complicated by portal hypertension manifested as esophageal varices and portal gastropathy), hypertension, generalized anxiety disorder, prior alcohol use disorder (no longer in remission), tobacco use disorder.  The patient presents to the [facility] for an evaluation and management of problem(s) noted below:  Problem List 1. Alcoholic cirrhosis of liver without ascites ([place])   2. Portal hypertension (HCC)   3. Elevated liver function tests   4. Uncomplicated alcohol dependence ([place])     History of Present Illness See prior notes for full details of HPI.  Interval History The patient returns for follow-up.  Unfortunately over the course of the last few months, things have become more difficult.  The patient had relapse of his alcohol intake.  He also stopped taking many of his medications on a regular basis.  He states that he trialed AAA but it was just not for him.  He is now scheduled in the coming weeks to see a new counselor in an effort of trying to see if they may be able to help with his substance use disorder.  He is accompanied today by his wife.  Things from a job standpoint have been more difficult and from a family perspective 2.  But, the patient does feel that he is willing and wants to improve his health for the betterment of his family moving forward.  In the last few days he has restarted all of his medications.  That coupled with his work schedule he is exhausted and fatigued but wanted to come in to get his follow-up.  He is drinking alcohol 2-3 times per week at this time.  He thinks that he can try to cut that back further but quitting completely is going to be  difficult.  The patient denies any issues with jaundice, scleral icterus, generalized pruritus, darkened/amber urine, clay-colored stools, LE edema, hematemesis, coffee-ground emesis, abdominal distention, confusion.  GI Review of Systems Positive as above Negative for odynophagia, dysphagia, nausea, vomiting, pain, change in bowel habits, melena, hematochezia   Review of Systems General: Denies fevers/chills/unintentional weight loss Cardiovascular: Denies chest pain Pulmonary: Denies shortness of breath Gastroenterological: See HPI Genitourinary: Denies darkened urine Hematological: Denies easy bruising/bleeding Dermatological: Denies jaundice Psychological: Mood is stable   Medications Current Outpatient Medications  Medication Sig Dispense Refill   ALPRAZolam (XANAX) 0.5 MG tablet TAKE 1 TABLET (0.5 MG TOTAL) BY MOUTH DAILY AS NEEDED FOR ANXIETY. 30 tablet 1   amLODipine (NORVASC) 5 MG tablet Take 1 tablet (5 mg total) by mouth daily. 90 tablet 1   escitalopram (LEXAPRO) 10 MG tablet Take 1.5 tablets (15 mg total) by mouth daily. 628 tablet 1   folic acid (FOLVITE) 1 MG tablet Take 1 tablet (1 mg total) by mouth daily. 90 tablet 1   Menaquinone-7 (VITAMIN K2) 100 MCG CAPS Take 2 capsules by mouth daily. X 1 month, then recheck labs. 1 capsule 0   prazosin (MINIPRESS) 2 MG capsule Take 1 capsule (2 mg total) by mouth at bedtime. 90 capsule 2   thiamine 100 MG tablet Take 1 tablet (100 mg total) by mouth daily. 30 tablet 0   nadolol (CORGARD) 20 MG tablet Take 2 tablets (40 mg total) by mouth daily. 60 tablet  6   omeprazole (PRILOSEC) 20 MG capsule TAKE 1 CAPSULE BY MOUTH TWICE DAILY FOR GERD 60 capsule 6   No current facility-administered medications for this visit.    Allergies Allergies  Allergen Reactions   Penicillins Anaphylaxis   Shrimp (Diagnostic) Hives   Gabapentin     Other reaction(s): Hallucinations   Hydrochlorothiazide    Iodine    Trazodone And Nefazodone  Other (See Comments)    Affected hearing, malaise    Histories Past Medical History:  Diagnosis Date   Anxiety    Cirrhosis ([place])    Generalized anxiety disorder 01/08/2014   GERD (gastroesophageal reflux disease)    Hypertension    Insomnia 01/08/2014   Male sexual dysfunction    Tobacco abuse 02/11/2014   Past Surgical History:  Procedure Laterality Date   BIOPSY  09/26/2020   Procedure: BIOPSY;  Surgeon: Irving Copas., MD;  Location: [place];  Service: Gastroenterology;;   ESOPHAGOGASTRODUODENOSCOPY N/A 09/26/2020   Procedure: ESOPHAGOGASTRODUODENOSCOPY (EGD);  Surgeon: Irving Copas., MD;  Location: [place];  Service: Gastroenterology;  Laterality: N/A;   LUMBAR LAMINECTOMY/ DECOMPRESSION WITH MET-RX Right 07/24/2020   Procedure: Right Lumbar Four-Five Minimally Invasive Microdiscectomy with Metrx;  Surgeon: Karsten Ro, DO;  Location: [place];  Service: Neurosurgery;  Laterality: Right;   UPPER GASTROINTESTINAL ENDOSCOPY     Social History   Socioeconomic History   Marital status: Married    Spouse name: [name]   Number of children: 0   Years of education: 14   Highest education level: Not on file  Occupational History   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  Tobacco Use   Smoking status: Every Day    Packs/day: 0.25    Years: 3.00    Total pack years: 0.75    Types: Cigarettes   Smokeless tobacco: Former  Scientific laboratory technician Use: Every day   Substances: Nicotine  Substance and Sexual Activity   Alcohol use: Yes    Alcohol/week: 7.0 - 8.0 standard drinks of alcohol    Types: 7 Cans of beer per week    Comment: 1-2 x a week   Drug use: No   Sexual activity: Yes    Partners: Female  Other Topics Concern   Not on file  Social History Narrative   Lives with wife and baby daughter, german shepherd and 2 cats   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   Activity: no regular exercise   Diet: good water,  fruits/vegetables daily   Social Determinants of Radio broadcast assistant Strain: Not on file  Food Insecurity: Not on file  Transportation Needs: Not on file  Physical Activity: Not on file  Stress: Not on file  Social Connections: Not on file  Intimate Partner Violence: Not on file   Family History  Problem Relation Age of Onset   Alcoholism Mother    High blood pressure Mother    Asthma Father    Alcoholism Father    Depression Father    Asthma Sister    Diabetes Paternal Uncle    Cancer Neg Hx    Stroke Neg Hx    CAD Neg Hx    Colon cancer Neg Hx    Stomach cancer Neg Hx    Esophageal cancer Neg Hx    Pancreatic cancer Neg Hx    Inflammatory bowel disease Neg Hx    Rectal cancer Neg Hx    I have reviewed his medical, social, and  family history in detail and updated the electronic medical record as necessary.    PHYSICAL EXAMINATION  BP 126/84   Pulse 71   Ht 6' (1.829 m)   Wt 254 lb (115.2 kg)   SpO2 96%   BMI 34.45 kg/m  Wt Readings from Last 3 Encounters:  10/28/21 254 lb (115.2 kg)  02/25/21 221 lb 3.2 oz (100.3 kg)  02/06/21 222 lb (100.7 kg)  GEN: Fatigued and looks exhausted today but in NAD, appears older than stated age, doesn't appear chronically ill, accompanied by wife PSYCH: Cooperative, without pressured speech EYE: Conjunctivae pale-pink, sclerae anicteric ENT: MMM CV: Nontachycardic RESP: No audible wheezing GI: NABS, soft, protuberant abdomen, rounded, nontender, hepatomegaly is appreciated, splenomegaly not appreciated, without rebound or guarding MSK/EXT: Trace bilateral pedal edema SKIN: Spider angiomata noted on upper thorax; no jaundice NEURO:  Alert & Oriented x 3, no focal deficits, no evidence of asterixis   REVIEW OF DATA  I reviewed the following data at the time of this encounter:  GI Procedures and Studies  Previously reviewed  Laboratory Studies  Reviewed those in epic and care everywhere  Imaging Studies  No new  imaging studies to review   ASSESSMENT  Mr. Lardizabal is a 36 y.o. male with a pmh significant for chronic alcoholic liver disease with prior alcoholic hepatitis (complicated by portal hypertension manifested as esophageal varices and portal gastropathy), hypertension, generalized anxiety disorder, prior alcohol use disorder (no longer in remission), tobacco use disorder.  The patient is seen today for evaluation and management of:  1. Alcoholic cirrhosis of liver without ascites ([place])   2. Portal hypertension (HCC)   3. Elevated liver function tests   4. Uncomplicated alcohol dependence ([place])    The patient is hemodynamically stable at this time.  Clinically, he looks exhausted but in no overt distress.  His liver biochemical testing suggest some abnormalities when rechecked 1 month ago.  He is going to have his labs rechecked at this time.  Unfortunately he did not get to a full year of sobriety thus I am concerned that his evidence of portal hypertension is likely going to still be present and be more of a sign of chronic liver disease development rather than just being alcoholic hepatitis.  They cannot afford the current ultrasound and we had considered wanting to do this with elastography.  They are going to reach out to the [place] to see about whether he may be able to become part of the [place] and subsequently get choice benefits that may allow [place] to get his imaging studies and follow-up completed with out having to worry as much about the costs.  At some point this year if we can I would like to at least get a liver ultrasound even if it is not with elastography.  I had wanted to repeat an upper endoscopy as well to see about whether the esophageal varices were persisting or not but since he is gone back to drinking I think there is a very high likelihood that they would still be present so for now he will just maintain his nonselective beta-blockade (which she just recently restarted.  We will see him back  in a few weeks time to see if he has been able to further decrease his alcohol intake, stay on his medications that have been recently restarted and hopefully try to keep him from decompensating further.  We discussed in great detail the role of alcohol cessation and how that can play a  significant role for alcoholic liver disease and he is hopeful to try and get some benefit in decreasing his intake.  Time will tell.  All patient questions were answered to the best of my ability, and the patient agrees to the aforementioned plan of action with follow-up as indicated.   PLAN  Volume -No need for diuretics currently -Standing weight 2-3 times per week -1500 mg Na diet Infection -No need for SBP prophylaxis is no ascites Bleeding -Repeat EGD in 2023 was planned but on hold for now -Continue nadolol 40 mg daily (titrated to heart rates in the 60s) Encephalopathy -None Screening -We will repeat at some point this year if they can afford and would be best with elastography but not sure if that will be possible Transplant -Meld is low from last month's labs but we will recheck this month Vaccination -Immune to hepatitis A and hepatitis B -Patient should consider vaccines to influenza, Pneumococcal by PCP in future  CBC/CMP/INR/direct bilirubin today Patient to reach out to [place] to see if he can get connected through the [place] choice with [place] Continue omeprazole 1-2 times daily as needed for GERD symptoms   Orders Placed This Encounter  Procedures   Bilirubin, Direct   CBC with Differential/Platelet   Comprehensive metabolic panel   Protime-INR     New Prescriptions   No medications on file   Modified Medications   Modified Medication Previous Medication   NADOLOL (CORGARD) 20 MG TABLET nadolol (CORGARD) 20 MG tablet      Take 2 tablets (40 mg total) by mouth daily.    Take 2 tablets (40 mg total) by mouth daily.   OMEPRAZOLE (PRILOSEC) 20 MG CAPSULE omeprazole (PRILOSEC) 20 MG capsule       TAKE 1 CAPSULE BY MOUTH TWICE DAILY FOR GERD    TAKE 1 CAPSULE BY MOUTH TWICE DAILY FOR GERD    Planned Follow Up Return in about 6 weeks (around 12/09/2021).   Total Time in Face-to-Face and in Coordination of Care for patient including independent/personal interpretation/review of prior testing, medical history, examination, medication adjustment, communicating results with the patient directly, and documentation within the EHR is 30 minutes.   Justice Britain, MD [facility] Office # [phone]

## 2021-10-28 NOTE — Patient Instructions (Signed)
Your provider has requested that you go to the basement level for lab work before leaving today. Press "B" on the elevator. The lab is located at the first door on the left as you exit the elevator.  We have sent the following medications to your pharmacy for you to pick up at your convenience: nadolol and omeprazole.   [facility] so we can become [facility] care.   The [facility] providers would like to encourage you to use [facility] to communicate with providers for non-urgent requests or questions.  Due to long hold times on the telephone, sending your provider a message by [facility] may be a faster and more efficient way to get a response.  Please allow 48 business hours for a response.  Please remember that this is for non-urgent requests.   Due to recent changes in healthcare laws, you may see the results of your imaging and laboratory studies on MyChart before your provider has had a chance to review them.  We understand that in some cases there may be results that are confusing or concerning to you. Not all laboratory results come back in the same time frame and the provider may be waiting for multiple results in order to interpret others.  Please give [place] 48 hours in order for your provider to thoroughly review all the results before contacting the office for clarification of your results.

## 2021-10-30 DIAGNOSIS — K766 Portal hypertension: Secondary | ICD-10-CM | POA: Insufficient documentation

## 2021-10-30 DIAGNOSIS — F1020 Alcohol dependence, uncomplicated: Secondary | ICD-10-CM | POA: Insufficient documentation

## 2021-11-11 ENCOUNTER — Encounter: Payer: Self-pay | Admitting: Nurse Practitioner

## 2021-11-11 ENCOUNTER — Ambulatory Visit (INDEPENDENT_AMBULATORY_CARE_PROVIDER_SITE_OTHER): Payer: BC Managed Care – PPO | Admitting: Nurse Practitioner

## 2021-11-11 VITALS — BP 132/81 | HR 58 | Temp 97.9°F | Ht 72.0 in | Wt 255.0 lb

## 2021-11-11 DIAGNOSIS — E538 Deficiency of other specified B group vitamins: Secondary | ICD-10-CM

## 2021-11-11 DIAGNOSIS — F411 Generalized anxiety disorder: Secondary | ICD-10-CM | POA: Diagnosis not present

## 2021-11-11 DIAGNOSIS — F4310 Post-traumatic stress disorder, unspecified: Secondary | ICD-10-CM | POA: Diagnosis not present

## 2021-11-11 DIAGNOSIS — Z6834 Body mass index (BMI) 34.0-34.9, adult: Secondary | ICD-10-CM

## 2021-11-11 DIAGNOSIS — I10 Essential (primary) hypertension: Secondary | ICD-10-CM

## 2021-11-11 DIAGNOSIS — K7031 Alcoholic cirrhosis of liver with ascites: Secondary | ICD-10-CM

## 2021-11-11 DIAGNOSIS — F5104 Psychophysiologic insomnia: Secondary | ICD-10-CM

## 2021-11-11 MED ORDER — AMLODIPINE BESYLATE 5 MG PO TABS: 5.0000 mg | ORAL_TABLET | Freq: Every day | ORAL | 1 refills | Status: AC

## 2021-11-11 MED ORDER — PRAZOSIN HCL 2 MG PO CAPS: 2.0000 mg | ORAL_CAPSULE | Freq: Every day | ORAL | 1 refills | Status: AC

## 2021-11-11 MED ORDER — ESCITALOPRAM OXALATE 10 MG PO TABS: 15.0000 mg | ORAL_TABLET | Freq: Every day | ORAL | 1 refills | Status: AC

## 2021-11-11 MED ORDER — ALPRAZOLAM 0.5 MG PO TABS: 0.5000 mg | ORAL_TABLET | Freq: Every day | ORAL | 1 refills | Status: AC | PRN

## 2021-11-11 MED ORDER — FOLIC ACID 1 MG PO TABS: 1.0000 mg | ORAL_TABLET | Freq: Every day | ORAL | 1 refills | Status: AC

## 2021-11-11 NOTE — Progress Notes (Signed)
Established patient visit   Patient: Albert Hall   DOB: October 07, 1985   36 y.o. Male  MRN: [id number] Visit Date: 11/11/2021   Chief Complaint  Patient presents with   Follow-up    Med refill   Subjective    HPI HPI     Follow-up    Additional comments: Med refill      Last edited by Adelfa Koh, CMA on 11/11/2021  1:10 PM.      Follow up visit.  -history of alcoholic sclerosis of the liver  -had been alcohol free for some time. -he states that he did "fall off" and start drinking again. -he has now stopped drinking hard liquor. Is drinking two to three beers a few days per week.  -did call yesterday to substance abuse counselor and is waiting for return call for appointment.  continues to see GI provider  -recently saw them again. Labs showing elevated LFTs again.  -Did go off of all of his medication for about two weeks. Has been back on everything for most recent two weeks.  -he does need refills for his routine medications today   Reviewed Pdmp profile -no red flags or state indicators present. -last fill alprazolam was 07/01/2021.    Medications: Outpatient Medications Prior to Visit  Medication Sig   Menaquinone-7 (VITAMIN K2) 100 MCG CAPS Take 2 capsules by mouth daily. X 1 month, then recheck labs.   nadolol (CORGARD) 20 MG tablet Take 2 tablets (40 mg total) by mouth daily.   omeprazole (PRILOSEC) 20 MG capsule TAKE 1 CAPSULE BY MOUTH TWICE DAILY FOR GERD   thiamine 100 MG tablet Take 1 tablet (100 mg total) by mouth daily.   [DISCONTINUED] ALPRAZolam (XANAX) 0.5 MG tablet TAKE 1 TABLET (0.5 MG TOTAL) BY MOUTH DAILY AS NEEDED FOR ANXIETY.   [DISCONTINUED] amLODipine (NORVASC) 5 MG tablet Take 1 tablet (5 mg total) by mouth daily.   [DISCONTINUED] escitalopram (LEXAPRO) 10 MG tablet Take 1.5 tablets (15 mg total) by mouth daily.   [DISCONTINUED] folic acid (FOLVITE) 1 MG tablet Take 1 tablet (1 mg total) by mouth daily.   [DISCONTINUED] prazosin  (MINIPRESS) 2 MG capsule Take 1 capsule (2 mg total) by mouth at bedtime.   No facility-administered medications prior to visit.    Review of Systems  Constitutional:  Positive for fatigue. Negative for activity change, chills and fever.  HENT:  Negative for congestion, postnasal drip, rhinorrhea, sinus pressure, sinus pain, sneezing and sore throat.   Eyes: Negative.   Respiratory:  Negative for cough, shortness of breath and wheezing.   Cardiovascular:  Negative for chest pain and palpitations.  Gastrointestinal:  Positive for abdominal pain. Negative for constipation, diarrhea, nausea and vomiting.  Endocrine: Negative for cold intolerance, heat intolerance, polydipsia and polyuria.  Genitourinary:  Negative for dysuria, frequency and urgency.  Musculoskeletal:  Negative for back pain and myalgias.  Skin:  Negative for rash.  Allergic/Immunologic: Negative for environmental allergies.  Neurological:  Negative for dizziness, weakness and headaches.  Psychiatric/Behavioral:  Positive for dysphoric mood. The patient is nervous/anxious.     Last CBC Lab Results  Component Value Date   WBC 8.0 09/23/2021   HGB 14.6 09/23/2021   HCT 42.3 09/23/2021   MCV 89.4 09/23/2021   MCH 33.0 10/03/2020   RDW 14.9 09/23/2021   PLT 189.0 54/49/2010   Last metabolic panel Lab Results  Component Value Date   GLUCOSE 107 (H) 09/23/2021   NA 141 09/23/2021   K  4.3 09/23/2021   CL 104 09/23/2021   CO2 28 09/23/2021   BUN 12 09/23/2021   CREATININE 0.70 09/23/2021   EGFR 139 10/02/2020   CALCIUM 9.5 09/23/2021   PHOS 2.9 09/26/2020   PROT 7.9 09/23/2021   ALBUMIN 4.7 09/23/2021   LABGLOB 3.2 10/02/2020   AGRATIO 1.2 10/02/2020   BILITOT 1.3 (H) 09/23/2021   ALKPHOS 207 (H) 09/23/2021   AST 42 (H) 09/23/2021   ALT 67 (H) 09/23/2021   ANIONGAP 11 09/28/2020   Last lipids Lab Results  Component Value Date   CHOL 223 (H) 09/04/2020   HDL 27 (L) 09/04/2020   LDLCALC 169 (H)  09/04/2020   TRIG 146 09/04/2020   CHOLHDL 8.3 (H) 09/04/2020   Last hemoglobin A1c Lab Results  Component Value Date   HGBA1C 5.4 09/04/2020   Last thyroid functions Lab Results  Component Value Date   TSH 2.540 09/04/2020   Last vitamin D Lab Results  Component Value Date   VD25OH 11.1 (L) 09/17/2020   Last vitamin B12 and Folate Lab Results  Component Value Date   VITAMINB12 889 09/27/2020   FOLATE 6.3 09/27/2020       Objective     Today's Vitals   11/11/21 1310  BP: 132/81  Pulse: (!) 58  Temp: 97.9 F (36.6 C)  TempSrc: Temporal  Weight: 255 lb (115.7 kg)   Body mass index is 34.58 kg/m.   BP Readings from Last 3 Encounters:  11/11/21 132/81  10/28/21 126/84  02/25/21 108/67    Wt Readings from Last 3 Encounters:  11/11/21 255 lb (115.7 kg)  10/28/21 254 lb (115.2 kg)  02/25/21 221 lb 3.2 oz (100.3 kg)    Physical Exam Vitals and nursing note reviewed.  Constitutional:      Appearance: Normal appearance. He is well-developed.  HENT:     Head: Normocephalic and atraumatic.     Nose: Nose normal.     Mouth/Throat:     Mouth: Mucous membranes are moist.     Pharynx: Oropharynx is clear.  Eyes:     Extraocular Movements: Extraocular movements intact.     Conjunctiva/sclera: Conjunctivae normal.     Pupils: Pupils are equal, round, and reactive to light.  Cardiovascular:     Rate and Rhythm: Normal rate and regular rhythm.     Pulses: Normal pulses.     Heart sounds: Normal heart sounds.  Pulmonary:     Effort: Pulmonary effort is normal.     Breath sounds: Normal breath sounds.  Abdominal:     Palpations: Abdomen is soft.  Musculoskeletal:        General: Normal range of motion.     Cervical back: Normal range of motion and neck supple.  Lymphadenopathy:     Cervical: No cervical adenopathy.  Skin:    General: Skin is warm and dry.     Capillary Refill: Capillary refill takes less than 2 seconds.  Neurological:     General: No  focal deficit present.     Mental Status: He is alert and oriented to person, place, and time.  Psychiatric:        Attention and Perception: Attention and perception normal.        Mood and Affect: Affect normal. Mood is anxious.        Speech: Speech normal.        Behavior: Behavior normal. Behavior is cooperative.        Thought Content: Thought content normal.  Cognition and Memory: Cognition and memory normal.        Judgment: Judgment normal.       Assessment & Plan    1. Essential hypertension Blood pressure well managed.  Continue amlodipine 5 mg daily.  Refills provided today. - amLODipine (NORVASC) 5 MG tablet; Take 1 tablet (5 mg total) by mouth daily.  Dispense: 90 tablet; Refill: 1  2. Post traumatic stress disorder Patient may take alprazolam 0.5 mg tablets 1 times daily if needed for severe anxiety.  Prescription for 30 tablets with 1 refill provided today. - ALPRAZolam (XANAX) 0.5 MG tablet; Take 1 tablet (0.5 mg total) by mouth daily as needed for anxiety.  Dispense: 30 tablet; Refill: 1  3. Folate deficiency Continue folic acid 1 mg daily. - folic acid (FOLVITE) 1 MG tablet; Take 1 tablet (1 mg total) by mouth daily.  Dispense: 90 tablet; Refill: 1  4. Generalized anxiety disorder/ PTSD Stable.  Continue Lexapro 15 mg daily.  Patient is scheduled for new patient visit with counselor/therapist. - escitalopram (LEXAPRO) 10 MG tablet; Take 1.5 tablets (15 mg total) by mouth daily.  Dispense: 135 tablet; Refill: 1  5. Psychophysiological insomnia Continue Minipress 2 mg use at bedtime. Patient is scheduled for new patient visit with counselor/therapist. - prazosin (MINIPRESS) 2 MG capsule; Take 1 capsule (2 mg total) by mouth at bedtime.  Dispense: 90 capsule; Refill: 1  6. Alcoholic cirrhosis of liver with ascites ([place]) Patient with moderately elevated liver enzymes.  He is seeing GI for management.  7. BMI 34.0-34.9,adult Encourage patient to limit  calorie intake to 2000 cal/day or less.  He should consume a low cholesterol, low-fat diet. Encouraged him to icorporate exercise into his daily routine.     Problem List Items Addressed This Visit       Cardiovascular and Mediastinum   Essential hypertension - Primary (Chronic)   Relevant Medications   prazosin (MINIPRESS) 2 MG capsule   amLODipine (NORVASC) 5 MG tablet     Digestive   Alcoholic cirrhosis of liver with ascites (HCC)     Other   H/o Insomnia (Chronic)   Relevant Medications   prazosin (MINIPRESS) 2 MG capsule   Generalized anxiety disorder/ PTSD (Chronic)   Relevant Medications   ALPRAZolam (XANAX) 0.5 MG tablet   escitalopram (LEXAPRO) 10 MG tablet   Post traumatic stress disorder   Relevant Medications   ALPRAZolam (XANAX) 0.5 MG tablet   escitalopram (LEXAPRO) 10 MG tablet   Folate deficiency   Relevant Medications   folic acid (FOLVITE) 1 MG tablet   BMI 34.0-34.9,adult     Return in about 6 months (around 05/14/2022) for health maintenance exam, FBW a week prior to visit.         Ronnell Freshwater, NP  [facility] at [facility] [phone] (phone) [phone] (fax)  [place]

## 2021-12-17 ENCOUNTER — Ambulatory Visit: Payer: BC Managed Care – PPO | Admitting: Gastroenterology

## 2022-01-19 ENCOUNTER — Other Ambulatory Visit: Payer: Self-pay | Admitting: Nurse Practitioner

## 2022-05-21 ENCOUNTER — Other Ambulatory Visit: Payer: Self-pay | Admitting: Nurse Practitioner

## 2022-11-11 ENCOUNTER — Other Ambulatory Visit: Payer: Self-pay | Admitting: Gastroenterology

## 2022-11-17 ENCOUNTER — Other Ambulatory Visit: Payer: Self-pay | Admitting: Gastroenterology

## 2024-03-31 DIAGNOSIS — H9202 Otalgia, left ear: Secondary | ICD-10-CM | POA: Diagnosis not present

## 2024-03-31 DIAGNOSIS — H6692 Otitis media, unspecified, left ear: Secondary | ICD-10-CM | POA: Diagnosis not present
